# Patient Record
Sex: Male | Born: 1935 | Race: White | Hispanic: No | Marital: Married | Smoking: Never smoker
Health system: Southern US, Community
[De-identification: ages and names within clinical notes are randomized; demographics above are authoritative.]

## PROBLEM LIST (undated history)

## (undated) DIAGNOSIS — I714 Abdominal aortic aneurysm, without rupture, unspecified: Secondary | ICD-10-CM

## (undated) DIAGNOSIS — R519 Headache, unspecified: Secondary | ICD-10-CM

## (undated) DIAGNOSIS — IMO0001 Reserved for inherently not codable concepts without codable children: Secondary | ICD-10-CM

## (undated) DIAGNOSIS — I2119 ST elevation (STEMI) myocardial infarction involving other coronary artery of inferior wall: Secondary | ICD-10-CM

## (undated) DIAGNOSIS — I10 Essential (primary) hypertension: Secondary | ICD-10-CM

## (undated) DIAGNOSIS — K5900 Constipation, unspecified: Secondary | ICD-10-CM

## (undated) DIAGNOSIS — I350 Nonrheumatic aortic (valve) stenosis: Principal | ICD-10-CM

## (undated) DIAGNOSIS — J189 Pneumonia, unspecified organism: Secondary | ICD-10-CM

## (undated) DIAGNOSIS — R51 Headache: Secondary | ICD-10-CM

## (undated) DIAGNOSIS — F329 Major depressive disorder, single episode, unspecified: Secondary | ICD-10-CM

## (undated) DIAGNOSIS — E785 Hyperlipidemia, unspecified: Secondary | ICD-10-CM

## (undated) DIAGNOSIS — E119 Type 2 diabetes mellitus without complications: Secondary | ICD-10-CM

## (undated) DIAGNOSIS — F32A Depression, unspecified: Secondary | ICD-10-CM

## (undated) DIAGNOSIS — N200 Calculus of kidney: Secondary | ICD-10-CM

## (undated) DIAGNOSIS — I2510 Atherosclerotic heart disease of native coronary artery without angina pectoris: Secondary | ICD-10-CM

## (undated) DIAGNOSIS — M1990 Unspecified osteoarthritis, unspecified site: Secondary | ICD-10-CM

## (undated) DIAGNOSIS — K219 Gastro-esophageal reflux disease without esophagitis: Secondary | ICD-10-CM

## (undated) DIAGNOSIS — I209 Angina pectoris, unspecified: Secondary | ICD-10-CM

## (undated) DIAGNOSIS — Z8719 Personal history of other diseases of the digestive system: Secondary | ICD-10-CM

## (undated) HISTORY — PX: LITHOTRIPSY: SUR834

## (undated) HISTORY — PX: CARDIAC VALVE REPLACEMENT: SHX585

## (undated) HISTORY — DX: Abdominal aortic aneurysm, without rupture, unspecified: I71.40

## (undated) HISTORY — PX: COLONOSCOPY: SHX174

## (undated) HISTORY — DX: Nonrheumatic aortic (valve) stenosis: I35.0

## (undated) HISTORY — PX: CARDIAC CATHETERIZATION: SHX172

## (undated) HISTORY — DX: Abdominal aortic aneurysm, without rupture: I71.4

## (undated) HISTORY — DX: Hyperlipidemia, unspecified: E78.5

## (undated) HISTORY — DX: Unspecified osteoarthritis, unspecified site: M19.90

---

## 2001-01-25 DIAGNOSIS — I2119 ST elevation (STEMI) myocardial infarction involving other coronary artery of inferior wall: Secondary | ICD-10-CM

## 2001-01-25 HISTORY — DX: ST elevation (STEMI) myocardial infarction involving other coronary artery of inferior wall: I21.19

## 2001-03-30 ENCOUNTER — Ambulatory Visit (HOSPITAL_COMMUNITY): Admission: RE | Admit: 2001-03-30 | Discharge: 2001-03-30 | Payer: Self-pay | Admitting: Cardiology

## 2002-05-24 ENCOUNTER — Emergency Department (HOSPITAL_COMMUNITY): Admission: EM | Admit: 2002-05-24 | Discharge: 2002-05-24 | Payer: Self-pay | Admitting: Emergency Medicine

## 2002-05-24 ENCOUNTER — Encounter: Payer: Self-pay | Admitting: Emergency Medicine

## 2002-07-03 ENCOUNTER — Encounter: Payer: Self-pay | Admitting: Neurosurgery

## 2002-07-03 ENCOUNTER — Encounter: Admission: RE | Admit: 2002-07-03 | Discharge: 2002-07-03 | Payer: Self-pay | Admitting: Neurosurgery

## 2003-09-06 ENCOUNTER — Emergency Department (HOSPITAL_COMMUNITY): Admission: EM | Admit: 2003-09-06 | Discharge: 2003-09-07 | Payer: Self-pay | Admitting: Emergency Medicine

## 2005-10-23 ENCOUNTER — Emergency Department (HOSPITAL_COMMUNITY): Admission: EM | Admit: 2005-10-23 | Discharge: 2005-10-23 | Payer: Self-pay | Admitting: Emergency Medicine

## 2006-06-08 ENCOUNTER — Emergency Department (HOSPITAL_COMMUNITY): Admission: EM | Admit: 2006-06-08 | Discharge: 2006-06-08 | Payer: Self-pay | Admitting: Emergency Medicine

## 2007-01-26 HISTORY — PX: CORONARY ANGIOPLASTY WITH STENT PLACEMENT: SHX49

## 2007-08-30 ENCOUNTER — Inpatient Hospital Stay (HOSPITAL_COMMUNITY): Admission: EM | Admit: 2007-08-30 | Discharge: 2007-09-04 | Payer: Self-pay | Admitting: Emergency Medicine

## 2007-09-01 ENCOUNTER — Ambulatory Visit: Payer: Self-pay | Admitting: Cardiothoracic Surgery

## 2007-09-19 ENCOUNTER — Ambulatory Visit (HOSPITAL_COMMUNITY): Admission: RE | Admit: 2007-09-19 | Discharge: 2007-09-20 | Payer: Self-pay | Admitting: Cardiology

## 2007-10-12 ENCOUNTER — Encounter (HOSPITAL_COMMUNITY): Admission: RE | Admit: 2007-10-12 | Discharge: 2008-01-10 | Payer: Self-pay | Admitting: Cardiology

## 2008-09-29 ENCOUNTER — Emergency Department (HOSPITAL_COMMUNITY): Admission: EM | Admit: 2008-09-29 | Discharge: 2008-09-29 | Payer: Self-pay | Admitting: Emergency Medicine

## 2009-12-22 ENCOUNTER — Emergency Department (HOSPITAL_COMMUNITY): Admission: EM | Admit: 2009-12-22 | Discharge: 2009-12-22 | Payer: Self-pay | Admitting: Emergency Medicine

## 2010-01-12 ENCOUNTER — Ambulatory Visit (HOSPITAL_COMMUNITY)
Admission: RE | Admit: 2010-01-12 | Discharge: 2010-01-12 | Payer: Self-pay | Source: Home / Self Care | Attending: Urology | Admitting: Urology

## 2010-04-06 LAB — GLUCOSE, CAPILLARY: Glucose-Capillary: 149 mg/dL — ABNORMAL HIGH (ref 70–99)

## 2010-04-07 LAB — COMPREHENSIVE METABOLIC PANEL
ALT: 20 U/L (ref 0–53)
Alkaline Phosphatase: 83 U/L (ref 39–117)
Glucose, Bld: 200 mg/dL — ABNORMAL HIGH (ref 70–99)
Potassium: 4.2 mEq/L (ref 3.5–5.1)
Sodium: 139 mEq/L (ref 135–145)
Total Protein: 6.9 g/dL (ref 6.0–8.3)

## 2010-04-07 LAB — POCT CARDIAC MARKERS

## 2010-04-07 LAB — URINALYSIS, ROUTINE W REFLEX MICROSCOPIC
Glucose, UA: 100 mg/dL — AB
Ketones, ur: 15 mg/dL — AB
Specific Gravity, Urine: 1.02 (ref 1.005–1.030)
pH: 7.5 (ref 5.0–8.0)

## 2010-04-07 LAB — CBC
HCT: 44.3 % (ref 39.0–52.0)
Hemoglobin: 15.5 g/dL (ref 13.0–17.0)
RDW: 13.5 % (ref 11.5–15.5)
WBC: 11.3 10*3/uL — ABNORMAL HIGH (ref 4.0–10.5)

## 2010-06-09 NOTE — Op Note (Signed)
NAME:  Michael Wolfe, Michael Wolfe NO.:  [id number]   MEDICAL RECORD NO.:  [id number]          PATIENT TYPE:  INP   LOCATION:  2914                         FACILITY:  [facility]   PHYSICIAN:  Eduardo Osier. Sharyn Lull, M.D. DATE OF BIRTH:  08/08/1931   DATE OF PROCEDURE:  08/31/2007  DATE OF DISCHARGE:                               OPERATIVE REPORT   PROCEDURE:  1. Successful percutaneous transluminal coronary angioplasty to mid      left anterior descending using 2.5 x 12 mm long Voyager balloon.  2. Successful deployment of 2.5 x 23 mm long Promus drug-eluting stent      in mid left anterior descending.  3. Successful post dilatation of Promus drug-eluting stent using 2.75      x 15 mm long [place] Voyager balloon.  4. Successful percutaneous transluminal coronary angioplasty to ostial      diagonal 1 using 2.5 x 8 mm long Voyager balloon.  5. Successful deployment of 2.5 x 12 mm long Promus drug-eluting stent      in ostial diagonal 1 using modified crush technique.  6. Successful percutaneous transluminal coronary angioplasty to mid      left anterior descending using 2.5 x 20 and then 3.2 x 12 mm long      Voyager balloon.  7. Successful deployment of 3.0 x 33 mm long Cypher drug-eluting stent      in proximal left anterior descending.  8. Successful post dilatation of Cypher drug-eluting stent using 3.25      x 20 mm long [place] Voyager balloon.  9. Successful percutaneous transluminal coronary angioplasty to ostial      diagonal 1 and proximal left anterior descending using kissing      balloon technique.   INDICATION FOR THE PROCEDURE:  Michael Wolfe is a 77 year old white male  with past medical history significant for coronary artery disease,  history of MI in the past, hypertension, hypercholesterolemia, and  noncompliant to medication, and followup.  He was admitted yesterday  because of recurrent retrosternal chest pressure off and on with minimal  exertion for last 1 year lately.   His chest pain frequency and duration  has increased with minimal activity.  He describes chest pain rates 6/10  associated with shortness of breath.  EKG done initially in the ER  showed normal sinus rhythm with Q-wave in inferior leads and ST  depression in anterolateral leads and was noted to have elevated  troponin-I and CPK-MB.  The patient emergently underwent left cath  yesterday and was noted to have three-vessel CAD with occluded left  circumflex system.  The patient was referred for CVTS for possible CABG,  but the patient refused for surgery despite multiple discussions with  the patient and family.  The patient initially wanted to go home and  signed out against medical advice but finally agreed for percutaneous  intervention.   PROCEDURE:  After obtaining informed consent, the patient was brought to  the Cath Lab and was placed on fluoroscopic table.  Right one was  prepped and draped in usual fashion.  A 2% Xylocaine was used  for local  anesthesia in the right one.  With the help of thin wall needle, a 7-  French arterial sheath was placed.  The sheath was aspirated and  flushed.   A7-French LAD XB guiding catheter was advanced over the wire and a  fluoroscopic guidance up to the ascending aorta.  Wire was pulled out.  The catheter was aspirated and connected to the manifold.  The catheter  was further advanced and engaged in to left coronary ostium.  Multiple  views of the left system were taken.   FINDINGS:  As before, LAD has complex proximal sequential 85-90%  stenosis and 75-80% mid focal stenosis.  Diagonal 1 has ostial  bifurcation 85% stenosis with LAD.   INTERVENTIONAL PROCEDURE:  Successful PTCA to mid LAD was done using 2.5  x 12 mm long Voyager balloon for predilatation and then 2.5 x 23 mm long  Promus drug-eluting stent was deployed at 11 atmospheric pressure in mid  LAD.  Stent was postdilated using 2.75 x 15 mm long [place] Voyager balloon  going up to 18  atmospheric pressure.  Lesion was dilated from 75%-0%  residual with excellent TIMI grade 3 distal flow without evidence of  dissection or distal embolization.  Then, successful PTCA to ostial  diagonal 1 was done using 2.5 x 8 mm long Voyager balloon.  Two  inflations were done going up to 11 atmospheric pressure.  Angiogram  showed persistent elastic recoil and then 2.5 x 12 mm long Promus drug-  eluting stent was deployed in the ostial diagonal 1 using double wire  technique and modified crush technique keeping 2.5 x 20 mm long Voyager  balloon in LAD.  Diagonal 1 stent was deployed at 12 atmospheric  pressure.  Lesion was dilated from 85% to less than 10% ostial with  excellent TIMI grade 3 distal flow without evidence of dissection or  distal embolization.  Then PTCA to proximal LAD was done using 2.5 x 20  mm long Voyager and then 3.0 x 12 mm long Voyager balloon going up to 8  atmospheric pressure for predilatation and then 3.0 x 30 mm long Cypher  drug-eluting stent was deployed at 13 atmospheric pressures.  Stent was  postdilated using 3.25 x 20 mm long [place] Voyager balloon going up to 18  atmospheric pressure and finally PTCA to ostial diagonal 1 and using 2.5  x 12 mm long Voyager balloon and 3.0 x 12 mm long Voyager balloon in LAD  was done using kissing balloon technique going up to 8 atmospheric  pressure.  LAD lesions are dilated from 85% to 90% to 0% residual with  excellent TIMI grade 3 distal flow without evidence of dissection or  distal embolization.  The patient received weight-based heparin,  Integrilin, and 600 mg of Plavix during the procedure.  The patient  tolerated the procedure well.  There were no complications.  The patient  was transferred to recovery room in stable condition.      Eduardo Osier. Sharyn Lull, M.D.  Electronically Signed     [facility]/MEDQ  D:  08/31/2007  T:  09/01/2007  Job:  [id number]

## 2010-06-09 NOTE — Discharge Summary (Signed)
NAME:  Michael Wolfe, Michael Wolfe               ACCOUNT NO.:  [id number]   MEDICAL RECORD NO.:  [id number]          PATIENT TYPE:  INP   LOCATION:  6527                         FACILITY:  [facility]   PHYSICIAN:  Mohan N. Sharyn Lull, M.D. DATE OF BIRTH:  1931-03-29   DATE OF ADMISSION:  09/19/2007  DATE OF DISCHARGE:  09/20/2007                               DISCHARGE SUMMARY   ADMITTING DIAGNOSES:  1. Coronary artery disease, status post recent non-Q-wave myocardial      infarction and inferoposterior wall myocardial infarction.  2. Multi-vessel coronary artery disease, status post recent      percutaneous coronary intervention to left anterior descending and      diagonal 1.  3. Stable angina.  4. Hypertension.  5. Non-insulin-dependent diabetes mellitus.  6. Hypercholesterolemia.   DISCHARGE DIAGNOSES:  1. Coronary artery disease, status post percutaneous transluminal      coronary angioplasty and stenting to 100% occluded left circumflex.  2. Multi-vessel coronary artery disease.  3. History of recent non-Q-wave and inferoposterior wall myocardial      infarction, status post percutaneous transluminal coronary      angioplasty and stenting to left anterior descending and diagonal 1      in recent past.  4. Stable angina.  5. Hypertension.  6. Non-insulin-dependent diabetes mellitus.  7. Hypercholesterolemia.   DISCHARGE HOME MEDICATIONS:  1. Enteric-coated aspirin 325 mg 1 tablet daily.  2. Plavix 75 mg 1 tablet daily with food.  3. Toprol-XL 25 mg 1 tablet daily.  4. Lisinopril 10 mg 1 tablet daily.  5. Crestor 20 mg 1 tablet daily.  6. Actos 45 mg 1 tablet daily.  7. Nitrostat 0.4 mg sublingual use as directed.   DIET:  Low-salt, low-cholesterol, 1800-calorie ADA diet.   The patient has been advised to increase activity slowly.  Post-PTCA-  stent instructions have been given.  The patient has been advised to  monitor blood sugar daily.   Follow up with me in 1 week.   Condition on  discharge is stable.   BRIEF HISTORY AND HOSPITAL COURSE:  Michael Wolfe is a 77 year old white  man with past medical history significant for multi-vessel coronary  artery disease, status post recent non-Q-wave MI and status post PCI to  LAD and diagonal 1, history of  silent inferoposterior wall MI with  occluded left circumflex, hypertension, non-insulin-dependent diabetes  mellitus, hypercholesterolemia, recently discharged from the hospital,  was seen in the office for followup.  The patient denies any chest pain,  nausea, vomiting, or diaphoresis; denies any shortness of breath, states  his activity is very limited, and is ready for further intervention.  Denies any PND, orthopnea, or leg swelling.  Denies palpitations,  lightheadedness, or syncope.  States he feels stronger after PCI to LAD  and diagonal 1.  The patient is admitted electively for PCI to 100%  occluded left circumflex and obtuse marginal.   PAST MEDICAL HISTORY:  As above.   PAST SURGICAL HISTORY:  None.   ALLERGIES:  No known drug allergies.   MEDICATIONS AT HOME:  1. Enteric-coated aspirin 325 mg p.o. daily.  2. Plavix 75 mg p.o. daily.  3. Toprol-XL 25 mg p.o. daily.  4. Imdur 30 mg every morning.  5. Lisinopril 10 mg p.o. daily.  6. Crestor 20 mg p.o. daily.  7. Actos 45 mg p.o. daily.  8. Nitrostat sublingual p.r.n.   ALLERGIES:  No known drug allergies.   SOCIAL HISTORY:  He is married for 50-plus years, worked in Designer, fashion/clothing.  No history of smoking or alcohol abuse.  Born in [place], lives in  [place]   FAMILY HISTORY:  Father died of complications of congestive heart  failure and was hypertensive and diabetic.  Mother died of cancer.  One  brother and sister are in good health.   PHYSICAL EXAMINATION:  GENERAL:  He is alert, awake, and oriented x3 in  no acute distress.  VITAL SIGNS:  Blood pressure was 134/80, pulse was 70 and regular.  HEENT:  Conjunctivae pink.  NECK:   Supple.  No JVD.  No bruit.  LUNGS:  Clear to auscultation without rhonchi or rales.  CARDIOVASCULAR:  S1 and S2 were normal.  There was soft systolic murmur.  ABDOMEN:  Soft.  Bowel sounds were present, nontender.  EXTREMITIES:  There was no clubbing, cyanosis, or edema.   LABORATORIES POST PROCEDURE:  CPK total was 160, MB 2.9, hemoglobin is  13.3, hematocrit 39.3, white count of 7.9, BUN is 13, creatinine 1.14,  potassium is 4.4, and glucose is 135.   BRIEF HISTORY AND HOSPITAL COURSE:  The patient was a.m. admit and  underwent left cardiac catheterization and PTCA and stenting to 100%  occluded left circumflex as per procedure report.  The patient tolerated  the procedure well.  There were no complications.  Post procedure, the  patient did not have any episodes of chest pain.  His groin is stable  with no evidence of hematoma or bruit.  The patient had been ambulating  in hallway  without any problems.  His groin is stable with no evidence of hematoma  or bruit.  His blood pressures and blood sugars are well controlled.  The patient will be discharged home on above medications and will be  followed up in my office in 1 week.  The patient also will be scheduled  for phase II cardiac rehabilitation as outpatient.      Michael Wolfe. Sharyn Lull, M.D.  Electronically Signed     [facility]/MEDQ  D:  09/20/2007  T:  09/21/2007  Job:  [id number]

## 2010-06-09 NOTE — Cardiovascular Report (Signed)
NAME:  Michael Wolfe, Michael Wolfe NO.:  [id number]   MEDICAL RECORD NO.:  [id number]          PATIENT TYPE:  INP   LOCATION:  2914                         FACILITY:  [facility]   PHYSICIAN:  Eduardo Osier. Sharyn Lull, M.D. DATE OF BIRTH:  10-10-1931   DATE OF PROCEDURE:  08/30/2007  DATE OF DISCHARGE:                            CARDIAC CATHETERIZATION   PROCEDURE:  Left cardiac cath, selective left and right coronary  angiography, LV graft to the right groin using Judkins technique.   INDICATIONS FOR PROCEDURE:  Michael Wolfe is a 77 year old white male with  past medical history significant for coronary artery disease, history of  MI in the past in 2003, hypertension, hypercholesterolemia, noncompliant  to medication and followup.  He came to the ER complaining of recurrent  retrosternal chest pressure off and on with minimal exertion for last 1  year and did not seek any medical attention, but has lately this chest  pain occurred more frequently with minimal activity and today he had  severe chest pain grade 6/10 associated with shortness of breath while  mowing the yard.  So decided to come to the ER.  The patient's EKG  showed in the ER normal sinus rhythm with Q wave in the inferior leads  and ST depression in the anterolateral leads and was noted to have  elevated troponin I and CPK-MB.  The patient was last seen in the office  in 2003 after the cath and was noted at that time to have 50-60%  proximal LAD and 60-70% ostial diagonal 1 and was opted for medical  management.  Due to typical anginal chest pain, EKG changes, and  elevated cardiac markers, I discussed with the patient and his wife  regarding emergency left cath, possible PTCA stenting, its risks and  benefits, i.e. death, MI, or stroke, need for emergency CABG, risk of  restenosis, local vascular complications, etc., and consented for the  procedure.   PROCEDURE:  After obtaining the informed consent, the patient was  brought to the cath lab and was placed on fluoroscope table.  The right  groin was prepped and draped in usual fashion.  2% Xylocaine was used  for local anesthesia in the right groin.  With the help of thin-wall  needle, 6-French arterial sheath was placed.  The sheath was aspirated  and flushed.  Next, 6-French left Judkins catheter was advanced over the  wire under fluoroscopic guidance up to the ascending aorta.  Wire was  pulled out.  The catheter was aspirated and connected to the manifold.  The catheter was further advanced and engaged into left coronary ostium.  Multiple views of the left system were taken.  Next, the catheter was  disengaged and was pulled out over the wire and was replaced with 6-  [place] right Judkins catheter, which was advanced over the wire under  fluoroscopic guidance up to the ascending aorta.  The wire was pulled  out.  The catheter was aspirated and connected to the manifold.  The  catheter was further advanced and engaged into right coronary ostium.  Multiple views of the right system  were taken.  Next, catheter was  disengaged and was pulled out over the wire and was replaced with 6-  French pigtail catheter, which was advanced over the wire under  fluoroscopic guidance up to the ascending aorta.  The wire was pulled  out.  The catheter was aspirated and connected to the manifold.  The  catheter was further advanced across the aortic valve and into the LV.  LV pressures were recorded.  Next, LV graft was done in 30-degree RAO  position.  Postangiographic pressures were recorded from LV and then  pullback pressures were recorded from the aorta.  There was no  significant gradient across the aortic valve.  Next, the pigtail  catheter was pulled out over the wire.  Sheaths were aspirated and  flushed.   FINDINGS:  LV showed good LV systolic function, EF of 55% to 60%.  Left  main was patent.  LAD has complex proximal sequential 85-90% stenosis  with  haziness and has 75-80% mid focal stenosis with TIMI grade 3 distal  flow.  Diagonal I has ostial bifurcation stenosis with LAD in the range  of 85% and the vessel is moderate size.  Diagonal II has 90-95% proximal  stenosis.  Vessel is less than 1.5 mm.  Ramus is very small, has 70% mid  stenosis.  The vessel is diffusely diseased and not suitable for  revascularization.  Left circumflex is 100% occluded proximally.  OM1 is  very very small.  RCA has 20-25% proximal and distal stenosis, and 75%  distal stenosis just prior to the bifurcation with PDA.  RCA also is  supplying collateral to the obtuse marginal branch of circumflex.  The patient tolerated the procedure well.  There are no complications.  Due to multiple complex lesion, we will get CVTS consult for possible  CABG for complete revascularization as soon as possible.  The patient  presently is chest pain free and was transferred to recovery room in  stable condition.      Eduardo Osier. Sharyn Lull, M.D.  Electronically Signed     [facility]/MEDQ  D:  08/30/2007  T:  08/31/2007  Job:  [id number]   cc:   Cath lab

## 2010-06-09 NOTE — Discharge Summary (Signed)
NAME:  Michael Wolfe, LYFORD NO.:  [id number]   MEDICAL RECORD NO.:  [id number]          PATIENT TYPE:  INP   LOCATION:  2019                         FACILITY:  [facility]   PHYSICIAN:  Eduardo Osier. Sharyn Lull, M.D. DATE OF BIRTH:  Dec 17, 1931   DATE OF ADMISSION:  08/30/2007  DATE OF DISCHARGE:  09/04/2007                               DISCHARGE SUMMARY   ADMITTING DIAGNOSES:  1. Acute non-Q-wave myocardial infarction.  2. Probable posterior wall myocardial infarction, age undetermined.  3. Post-infarction angina.  4. Coronary artery disease.  5. History of myocardial infarction in the past.  6. Hypertension.  7. Hypercholesteremia.  8. New-onset diabetes mellitus.   FINAL DIAGNOSES:  1. Status post non-Q-wave myocardial infarction.  2. Status post post-infarction angina.  3. Multivessel coronary artery disease.  4. History of posterior wall myocardial infarction in the past.  5. Hypertension.  6. Hypercholesteremia.  7. New-onset diabetes mellitus.   DISCHARGE HOME MEDICATIONS:  1. Enteric-coated aspirin 325 mg 1 tablet daily.  2. Plavix 75 mg 1 tablet daily with food.  3. Toprol-XL 25 mg 1 tablet daily.  4. Imdur 30 mg 1 tablet every morning.  5. Lisinopril 10 mg 1 tablet daily.  6. Crestor 20 mg 1 tablet daily.  7. Actos 30 mg 1 tablet daily.  8. Nitrostat 0.4 mg sublingual, use as directed.   DIET:  Low-salt, low-cholesterol 1800 calories ADA diet.   The patient has been advised to monitor blood sugar twice daily.  Post  PTCA stent instructions have been given.  Increase activity slowly.  Avoid any lifting, pushing or pulling for 1 week.  Follow up with me in  1 week.   CONDITION AT DISCHARGE:  Stable.   The patient will be scheduled for phase 2 cardiac rehab after PCI to  left circumflex and distal RCA as stage procedure.   BRIEF HISTORY AND HOSPITAL COURSE:  Mr. Michael Wolfe is a 77 year old white  male with past medical history significant for coronary artery  disease,  history of MI in the past, hypertension, noncompliant to medication, and  followup.  He came to the ER complaining of recurrent retrosternal chest  pressure off and on with minimal exertion for last 1 year.  Lately,  chest pain occurs with minimal activity and today had severe chest pain  graded 6/10 associated with shortness of breath while moving in yard, so  decided to come to the ER.  The patient's EKG showed normal sinus rhythm  with ST depression in anterolateral leads and was noted to have elevated  troponin I and CPK-MB.  The patient was last seen in the office in 2003,  when he had left cath and was noted to have 50-60% proximal LAD and 60-  70% ostial diagonal one stenosis and opted for medical management.   PAST MEDICAL HISTORY:  As above.   PAST SURGICAL HISTORY:  None.   ALLERGIES:  No known drug allergies.   MEDICATIONS:  He takes aspirin on p.r.n. basis and Tylenol as needed.   SOCIAL HISTORY:  He is married for 50 plus years.  Worked in Designer, fashion/clothing.  No history of smoking or alcohol abuse.  He was born Michael Wolfe and  l[place] in [place], [place]   FAMILY HISTORY:  Father died of complications of congestive heart  failure.  He was hypertensive and diabetic at the age of 77.  Mother  died of cancer.  One brother and one sister in good health.   PHYSICAL EXAMINATION:  GENERAL:  He is alert, awake, and oriented x3, in  no acute distress.  VITAL SIGNS:  Blood pressure was 147/94 and pulse was 76 and regular.  HEENT:  Conjunctivae was pink.  NECK:  Supple.  No JVD.  No bruit.  LUNGS:  Clear to auscultation without rhonchi or rales.  CARDIOVASCULAR:  S1 and S2 normal.  There was soft systolic murmur.  ABDOMEN:  Soft.  Bowel sounds are present.  Nontender.  EXTREMITIES:  There is no clubbing, cyanosis, or edema.   LABORATORY DATA:  His other labs are not present in the chart, but  admission hemoglobin was 15.5, hematocrit 45.2, and white count of 8.2.   His CPK-MB was 7.5.  Troponin I was 0.15.  Repeat troponin I was 0.82  and CPK-MB was 19.2.  His glucose was 409, BUN 16, and creatinine 1.2.  His cholesterol was 266 and triglycerides 653.  Repeat CPK by lab 372,  MB 25.6, relative index 6.9, and troponin I was 5.5.  His hemoglobin A1c  was 12.7.  Troponin I on September 02, 2007, was 1.19.   BRIEF HOSPITAL COURSE:  The patient was directly taken to the cath lab  and underwent emergency left cath as per procedure report.  The patient  tolerated the procedure well.  The patient was noted to have multivessel  CAD, and CVTS consult initially was obtained for possible CABG, but the  patient refused for CABG and then subsequently underwent PTCA stenting  to LAD and diagonal one as per procedure report.  The patient tolerated  the procedure well.  There were no complications.  The patient did not  have any further episodes of chest pain during the hospital stay.  Phase  1cardiac rehab was called.  The patient has been ambulating in hallway  without any problems.  His groin is stable with no evidence of hematoma  or bruit.  The patient's blood sugar still remains in 200 range.  Actos  has been increased.  The patient will be discharged home and will be  followed up in my office in 1 week.  If the patient agrees for further  PCI or if the patient gets recurrent chest pain, we will consider PCI to  obtuse marginal and distal RCA.      Eduardo Osier. Sharyn Lull, M.D.  Electronically Signed     [facility]/MEDQ  D:  09/04/2007  T:  09/05/2007  Job:  [id number]

## 2010-06-09 NOTE — Cardiovascular Report (Signed)
NAME:  Michael Wolfe, Michael Wolfe               ACCOUNT NO.:  [id number]   MEDICAL RECORD NO.:  [id number]          PATIENT TYPE:  INP   LOCATION:  6527                         FACILITY:  [facility]   PHYSICIAN:  Mohan N. Sharyn Lull, M.D. DATE OF BIRTH:  [birth year]   DATE OF PROCEDURE:  09/19/2007  DATE OF DISCHARGE:                            CARDIAC CATHETERIZATION   PROCEDURE:  1. Left cardiac catheterization with selective left and right coronary      angiography via right groin using Judkins technique.  2. Successful percutaneous transluminal coronary angioplasty to 100%      occluded proximal left circumflex using initially 1.5 x 12-mm long      Sprinter balloon and then 2.5 x 12-mm long Voyager balloon.  3. Successful deployment of 3.0 x 28-mm long Promus drug-eluting stent      in proximal and mid left circumflex.  4. Successful post dilatation of Promus stent using 3.5 x 12-mm long      Rollins Voyager balloon.   INDICATIONS FOR PROCEDURE:  Mr. Bartoli is a 77 year old white male with  past medical history significant for multivessel coronary artery disease  status post recent non-Q-wave MI, status post PCI to LAD and diagonal 1,  history of silent inferoposterior wall MI in the past with occluded left  circumflex, hypertension, non-insulin-dependent diabetes mellitus,  hypercholesteremia, and recently discharged from the hospital was seen  in the office for followup.  The patient denies any chest pain, nausea,  vomiting, or diaphoresis.  Denies shortness of breath.  States his  activity is very limited as he is ready for further intervention.  Denies any PND, orthopnea, or leg swelling.  Denies palpitation,  lightheadedness, or syncope.  States feels stronger after PCI to LAD and  diagonal 1.  The patient is admitted electively for PCI to 100% of  occluded left circumflex plus/minus distal bifurcation RCA stenosis.  After obtaining informed consent, the patient was brought to the cath  lab and was  placed on fluoroscopy table.  Right groin was prepped and  draped in usual fashion.  Xylocaine 2% was used for local anesthesia in  the right groin.  With the help of thin-wall needle, a 6-French arterial  sheath was placed.  The sheath was aspirated and flushed.  Next, a 6-  [place], 3.5 XB guiding catheter was advanced over the wire under  fluoroscopic guidance to the ascending aorta.  Wire was pulled out.  The  catheter was aspirated and connected to the manifold.  Catheter was  further advanced and engaged into left coronary ostium.  Multiple views  of the left system were taken.   FINDINGS:  At the end of the procedure, right JR-4 guiding catheter was  advanced over the wire under fluoroscopic guidance up to the ascending  aorta.  Wire was pulled out.  The catheter was aspirated and connected  to the manifold.  Catheter was further advanced and engaged into right  coronary ostium.  A single view of right coronary artery was obtained.  Next, the catheter was pulled out over the wire.  Sheaths aspirated and  flushed.  FINDINGS:  LV was not done.  Left main was patent.  LAD diagonal 1 was  patent at prior PTCA stented site.  Left circumflex was 100% occluded  proximally as before.  RCA has multiple sequential proximal and mid 30-  40% stenosis and 50-60% distal bifurcation stenosis.   INTERVENTIONAL PROCEDURE:  Successful PTCA to proximal left circumflex  was done using 1.5 x 12-mm long Sprinter balloon for predilatation and  then 2.5 x 12-mm long Voyager balloon for predilatation and then 3.0 x  28-mm long Promus drug-eluting stent was deployed in proximal and mid  left circumflex at 13 atmospheric pressure.  Stent was postdilated using  3.5 x 12-mm long Voyager balloon going up to 18 atmospheric pressure.  Lesion was dilated from 100% to 0% diastole with excellent TIMI grade  III.  Distal flow without evidence of  dissection or distal embolization.  The patient received  weight-based  Angiomax and 300 mg of Plavix during the procedure.  The patient  tolerated the procedure well.  There are no complications.  The patient  was transferred to recovery room in stable condition.      Eduardo Osier. Sharyn Lull, M.D.  Electronically Signed     MNH/MEDQ  D:  09/19/2007  T:  09/20/2007  Job:  [id number]   cc:   Catheterization Laboratory

## 2010-06-09 NOTE — Consult Note (Signed)
NAME:  Michael Wolfe, Michael Wolfe NO.:  [id number]   MEDICAL RECORD NO.:  [id number]          PATIENT TYPE:  INP   LOCATION:  2914                         FACILITY:  [facility]   PHYSICIAN:  Kerin Perna, M.D.  DATE OF BIRTH:  19-Jun-1931   DATE OF CONSULTATION:  DATE OF DISCHARGE:                                 CONSULTATION   PHYSICIAN REQUESTING THIS CONSULTATION:  Mohan N. Harwani, MD   REASON FOR CONSULTATION:  Unstable angina with severe three-vessel  coronary artery disease.   CHIEF COMPLAINT:  Chest pain.   HISTORY OF PRESENT ILLNESS:  I was asked to evaluate this 77 year old  white male ex-smoker for potential surgical coronary revascularization  for recently diagnosed severe three-vessel coronary artery disease.  The  patient has had progressive chest pain over the past several days  related to exercise.  Any lifting or exertional activity usually brings  on substernal pressing chest pain with some radiation to the shoulders.  He has been taking over-the-counter pain medications without  improvement.  He presented to the emergency department this afternoon  with nonspecific ST-segment depression, and cardiac enzymes were checked  and his troponin one was 0.8.  He was taken for urgent cardiac  catheterization by Dr. Sharyn Lull which demonstrated a heavily diseased  proximal LAD with a tight 90-95% stenosis as well as some distal  disease.  There is also disease at the bifurcation of a large first  diagonal.  He had a hyperdominant right stenosis at the distal  bifurcation as well as a chronically occluded OM II branch of the  circumflex.  His EF was 50%.  He was pain free following cath; however,  due to his anatomy and recent symptoms, urgent surgical coronary  revascularization was recommended to the patient both by Dr. Sharyn Lull and  myself.  The patient at this time is not willing to proceed with surgery  and wishes to pursue further medical therapy.   PAST MEDICAL  HISTORY:  1. Hypertension.  2. Noninsulin-dependent diabetes, recent diagnosis.  3. Hyperlipidemia.  4. Chronic low back pain.   HOME MEDICATIONS:  Aspirin 81 mg and over-the-counter pain medication.   ALLERGIES:  None.   SOCIAL HISTORY:  The patient is married and retired.  He worked in  Designer, fashion/clothing and does not currently smoke or use alcohol.   FAMILY HISTORY:  Positive for hypertension and heart failure.   REVIEW OF SYSTEMS:  Constitutional review is negative for fever or  weight loss.  No recent serious upper respiratory infections.  He denies  problem swallowing.  He has total dental extraction with upper and lower  dental plates.  He had a cath done in 2003 with moderate nonsurgical  disease.  He denies history of diabetes, although his blood sugar today  was 400.  He has one episode of bilateral ankle swelling which responded  to elevation and Epsom salts.  He denies blood per rectum, jaundice, or  history of gallstones.  He denies BPH, kidney stones, or hematuria.  He  denies DVT, claudication, TIA, or stroke.  He denies bleeding disorder  or prior  blood transfusion.  He does not admit to diabetes or thyroid  disease.   PHYSICAL EXAMINATION:  The patient is 5 feet 9 inches and weighs 190  pounds.  Blood pressure is 140/80 and pulse 80 in sinus.  General  appearance is that of a pleasant elderly male in the cath lab holding  area in no acute stress.  HEENT exam is normocephalic.  He is  edentulous.  Neck is without JVD, mass, or bruit.  Lymphatics reveal no  palpable supraclavicular or cervical adenopathy.  Breath sounds are  clear.  There is no thoracic deformity.  Cardiac exam is regular rhythm  without rubs, gallop, or murmur.  Abdominal exam is soft without  pulsatile mass.  He has a compression dressing in the right groin from  the cardiac cath.  The extremities reveal mild clubbing, but no cyanosis  or edema.  Peripheral pulses are strong in all extremities.  He is  right-  hand dominant.  He has no focal motor deficit.   LABORATORY DATA:  I reviewed the coronary arteriograms with Dr. Sharyn Lull  and the patient would benefit from bypass graft to the LAD diagonal, OM  II, and distal posterolateral branch of the right coronary.  His EF is  fairly well preserved.  The patient is not willing to proceed with  surgery.  At this time, we will follow up in the morning in  approximately 12 hours to review the situation.  All questions regarding  surgery and the recommendation to proceed with surgery was clearly made  to the patient.   Thank you for this consultation.       Kerin Perna, M.D.  Electronically Signed     PV/MEDQ  D:  08/30/2007  T:  08/31/2007  Job:  [id number]

## 2010-06-12 NOTE — Cardiovascular Report (Signed)
[place]  Patient:    Michael Wolfe, Michael Wolfe Visit Number: [id number] MRN: [id number]          Service Type: CAT Location: [facility] 2857 [id number] Attending Physician:  Robynn Pane Dictated by:   Eduardo Osier Sharyn Lull, M.D. Proc. Date: 03/30/01 Admit Date:  03/30/2001   CC:         Cardiac Catheterization Laboratory  Osvaldo Shipper. Spruill, M.D.   Cardiac Catheterization  PROCEDURE: Left cardiac catheterization with selective left and right coronary angiography, left ventriculography via the right groin using Judkins technique.  INDICATIONS FOR PROCEDURE: The patient is a 77 year old, white male with a past medical history significant for coronary artery disease, status post recent inferoposterior wall MI, hypertension, hypercholesterolemia, recently discharged from [facility] [place], complaining of feeling weak, tired and shortness of breath with minimal exertion. He states he was in [place] and was told to have MI and subsequently underwent stress Cardiolite which showed inferolateral wall ischemia with EF of 40% and was advised for left catheterization, possible PTCA and stenting, but came to [facility] for a second opinion. The patient denies chest pain, nausea, vomiting or diaphoresis. Denies PND, orthopnea, leg swelling. Denies palpitations, lightheadedness or syncope.  PAST MEDICAL HISTORY: As above.  PAST SURGICAL HISTORY: None.  ALLERGIES: None.  MEDICATIONS: He takes 1. Enteric-coated aspirin 325 mg p.o. q.d. 2. Coreg 3.125 mg p.o. q.12h. 3. Diovan HCT 160/12.5 p.o. q.d. 4. Lipitor 80 mg half tablet q.d.  SOCIAL HISTORY: He is married for 40 years, retired, worked in Press photographer. No history of smoking or alcohol abuse.  He was born in [place], moved from [place], [place], recently.  FAMILY HISTORY: Father died of congestive heart failure at the age of 51. He was hypertensive and  diabetic. Mother is alive. She is 89. She has cancer. One brother and one sister in good health.  PHYSICAL EXAMINATION:  GENERAL: On examination, he is alert, awake, oriented x3 in no acute distress.   VITAL SIGNS: Blood pressure was 140/84, pulse was 80, regular.  HEENT: Conjunctiva pink.  NECK: Supple, no JVD, no bruits.  LUNGS: Lungs are clear to auscultation without rhonchi or rales.  CARDIOVASCULAR: Cardiovascular examination: S1 and S2 were normal There was no S3 gallop. There was a soft systolic murmur at the apex.  ABDOMEN:  Soft. Bowel sounds were present, nontender.  EXTREMITIES: There is no clubbing, cyanosis or edema.  IMPRESSION: Coronary artery disease, status post inferoposterior wall myocardial infarction, positive stress Cardiolite, hypertension, hypercholesterolemia, exertional dyspnea, and weakness, probable angina equivalent.  PLAN: Discussed with patient regarding left catheterization, possible PTCA and stenting, it risks and benefits, i.e. death, MI, stroke, need for emergency CABG, risk of restenosis, local vascular complications, etc., and consented for PCI.  DESCRIPTION OF PROCEDURE: After obtaining the informed consent, the patient was brought to the catheterization lab and was placed on the fluoroscopy table.  The right groin was prepped and draped in the usual fashion. Xylocaine 2% was used for local anesthesia in the right groin. With the help of a thin-walled needle, a 6 French arterial sheath was placed. The sheath was aspirated and flushed. Next, a 6 French left Judkins catheter was advanced over the wire under fluoroscopic guidance up to the ascending aorta. The wire was pulled out, the catheter was aspirated and connected to the manifold. The catheter was further advanced and attempted to engage into the left coronary ostium without success. Then a 5  [place] JL4 catheter was advanced over the wire under fluoroscopic guidance up to the  ascending aorta. The wire was pulled out, the catheter was aspirated and connected to the manifold. The catheter was further advanced and engaged into left coronary ostium. Multiple views of the left system were taken. Next, the catheter was disengaged and was pulled out over the wire and was replaced with a 6 French right Judkins catheter, which was advanced over the wire under fluoroscopic guidance up to the ascending aorta. The wire was pulled out, the catheter was aspirated and connected to the manifold. The catheter was further advanced and engaged into the right coronary ostium. Multiple views of the right system were taken. Next, the catheter was disengaged and was pulled out over the wire and was replaced with a 6 French pigtail catheter, which was advanced over the wire under fluoroscopic guidance up to the ascending aorta. The wire was pulled out, the catheter was aspirated and connected to the manifold. The catheter was further advanced across the aortic valve into the LV. LV pressures were recorded. Next, LV-graphy was done in 30-degree RAO position. Post angiographic pressures were recorded from the LV and then pullback pressures were recorded from the aorta. There was no gradient across the aortic valve. Next, the pigtail catheter was pulled out over the wire, sheaths were aspirated and flushed.  FINDINGS: LV showed good left ventricular systolic function. LV had EF of 60-65%.  Left main was patent.  LAD has 50-60% proximal stenosis. Diagonal #1 has 60-70% ostial stenosis.  Left circumflex is patent proximally and tapers down in the AV groove after giving off large OM-3. OM-1 and OM-2 are very small. They are less than 0.5 mm. OM-3 is large which is patent.  RCA is patent.  Arteriotomy was closed with Perclose without any complications. The patient tolerated the procedure well. There were no complications.   PLAN: To continue with medical treatment. If patient has  recurrent chest pain, will consider doing stress Cardiolite here before attempting to do PCI to diagonal. Dictated by:   Eduardo Osier. Sharyn Lull, M.D. Attending Physician:  Robynn Pane DD:  03/30/01 TD:  03/30/01 Job: [id number]

## 2010-10-23 LAB — BASIC METABOLIC PANEL
BUN: 14
BUN: 14
CO2: 27
CO2: 28
Chloride: 102
Chloride: 103
Chloride: 104
Creatinine, Ser: 0.92
Creatinine, Ser: 1
Creatinine, Ser: 1.01
GFR calc Af Amer: >60
GFR calc non Af Amer: >60
GFR calc non Af Amer: >60
Glucose, Bld: 206 — ABNORMAL HIGH
Glucose, Bld: 209 — ABNORMAL HIGH
Glucose, Bld: 218 — ABNORMAL HIGH
Glucose, Bld: 232 — ABNORMAL HIGH
Potassium: 3.7
Potassium: 4.3
Sodium: 136

## 2010-10-23 LAB — CBC
HCT: 37.6 — ABNORMAL LOW
HCT: 38.2 — ABNORMAL LOW
HCT: 39.7
HCT: 44.6
HCT: 45.2
Hemoglobin: 13.1
Hemoglobin: 13.2
Hemoglobin: 13.6
Hemoglobin: 13.7
Hemoglobin: 15.5
MCHC: 34.4
MCHC: 34.6
MCHC: 34.8
MCHC: 35
MCV: 93.6
MCV: 93.8
MCV: 94.2
MCV: 94.3
MCV: 94.7
Platelets: 157
Platelets: 188
RBC: 4.04 — ABNORMAL LOW
RBC: 4.17 — ABNORMAL LOW
RBC: 4.84
RDW: 13.7
RDW: 13.8
RDW: 13.8
WBC: 11.1 — ABNORMAL HIGH
WBC: 8.2
WBC: 9.4

## 2010-10-23 LAB — DIFFERENTIAL
Basophils Absolute: 0
Eosinophils Absolute: 0.3
Eosinophils Relative: 2
Eosinophils Relative: 3
Lymphocytes Relative: 20
Lymphocytes Relative: 26
Lymphs Abs: 2.1
Monocytes Absolute: 0.7
Monocytes Absolute: 0.9

## 2010-10-23 LAB — APTT: aPTT: 31

## 2010-10-23 LAB — HEPARIN LEVEL (UNFRACTIONATED): Heparin Unfractionated: 0.26 — ABNORMAL LOW

## 2010-10-23 LAB — CROSSMATCH: ABO/RH(D): AB POS

## 2010-10-23 LAB — CK TOTAL AND CKMB (NOT AT ARMC)
CK, MB: 13.3 — ABNORMAL HIGH
CK, MB: 28.6 — ABNORMAL HIGH
Relative Index: 6.9 — ABNORMAL HIGH
Relative Index: 7.7 — ABNORMAL HIGH
Total CK: 219
Total CK: 357 — ABNORMAL HIGH
Total CK: 370 — ABNORMAL HIGH
Total CK: 372 — ABNORMAL HIGH

## 2010-10-23 LAB — LIPID PANEL
HDL: 36 — ABNORMAL LOW
Triglycerides: 653 — ABNORMAL HIGH
VLDL: UNDETERMINED

## 2010-10-23 LAB — PROTIME-INR
INR: 1
Prothrombin Time: 13.1

## 2010-10-23 LAB — POCT CARDIAC MARKERS
CKMB, poc: 19.2
CKMB, poc: 7.5
Troponin i, poc: 0.82

## 2010-10-23 LAB — POCT I-STAT, CHEM 8
BUN: 16
Calcium, Ion: 1.04 — ABNORMAL LOW
Chloride: 105
Creatinine, Ser: 1.2
TCO2: 23

## 2010-10-23 LAB — TROPONIN I
Troponin I: 1.19
Troponin I: 6.39

## 2010-10-26 LAB — GLUCOSE, CAPILLARY
Glucose-Capillary: 124 — ABNORMAL HIGH
Glucose-Capillary: 82
Glucose-Capillary: 158 — ABNORMAL HIGH
Glucose-Capillary: 174 — ABNORMAL HIGH
Glucose-Capillary: 93
Glucose-Capillary: 97

## 2010-12-31 ENCOUNTER — Other Ambulatory Visit: Payer: Self-pay | Admitting: Cardiology

## 2011-01-24 ENCOUNTER — Other Ambulatory Visit: Payer: Self-pay | Admitting: Cardiology

## 2011-02-04 ENCOUNTER — Other Ambulatory Visit: Payer: Self-pay | Admitting: Cardiology

## 2011-05-12 ENCOUNTER — Other Ambulatory Visit: Payer: Self-pay | Admitting: Cardiology

## 2011-08-11 ENCOUNTER — Other Ambulatory Visit: Payer: Self-pay | Admitting: Cardiology

## 2011-09-12 ENCOUNTER — Other Ambulatory Visit: Payer: Self-pay | Admitting: Cardiology

## 2012-08-25 ENCOUNTER — Other Ambulatory Visit (HOSPITAL_COMMUNITY): Payer: Self-pay | Admitting: Cardiology

## 2012-08-25 DIAGNOSIS — R079 Chest pain, unspecified: Secondary | ICD-10-CM

## 2012-09-01 ENCOUNTER — Encounter (HOSPITAL_COMMUNITY)
Admission: RE | Admit: 2012-09-01 | Discharge: 2012-09-01 | Disposition: A | Discharge status: Home / Self Care | Payer: Medicare Other | Source: Ambulatory Visit | Attending: Cardiology | Admitting: Cardiology

## 2012-09-01 ENCOUNTER — Other Ambulatory Visit: Payer: Self-pay

## 2012-09-01 DIAGNOSIS — R079 Chest pain, unspecified: Secondary | ICD-10-CM

## 2012-09-01 MED ORDER — TECHNETIUM TC 99M SESTAMIBI GENERIC - CARDIOLITE
10.0000 | Freq: Once | INTRAVENOUS | Status: AC | PRN
  Administered 2012-09-01: 10 via INTRAVENOUS

## 2012-09-01 MED ORDER — REGADENOSON 0.4 MG/5ML IV SOLN
0.4000 mg | Freq: Once | INTRAVENOUS | Status: AC
  Administered 2012-09-01: 0.4 mg via INTRAVENOUS

## 2012-09-01 MED ORDER — REGADENOSON 0.4 MG/5ML IV SOLN
INTRAVENOUS | Status: AC
  Filled 2012-09-01: qty 5

## 2012-09-01 MED ORDER — TECHNETIUM TC 99M SESTAMIBI GENERIC - CARDIOLITE
30.0000 | Freq: Once | INTRAVENOUS | Status: AC | PRN
  Administered 2012-09-01: 30 via INTRAVENOUS

## 2012-09-11 ENCOUNTER — Encounter (HOSPITAL_COMMUNITY): Payer: Self-pay | Admitting: Pharmacy Technician

## 2012-09-12 ENCOUNTER — Encounter (HOSPITAL_COMMUNITY)
Admission: RE | Disposition: A | Discharge status: Home / Self Care | Payer: Self-pay | Source: Ambulatory Visit | Attending: Cardiology

## 2012-09-12 ENCOUNTER — Ambulatory Visit (HOSPITAL_COMMUNITY)
Admission: RE | Admit: 2012-09-12 | Discharge: 2012-09-12 | Disposition: A | Discharge status: Home / Self Care | Payer: Medicare Other | Source: Ambulatory Visit | Attending: Cardiology | Admitting: Cardiology

## 2012-09-12 DIAGNOSIS — I359 Nonrheumatic aortic valve disorder, unspecified: Secondary | ICD-10-CM | POA: Insufficient documentation

## 2012-09-12 DIAGNOSIS — E7800 Pure hypercholesterolemia, unspecified: Secondary | ICD-10-CM | POA: Insufficient documentation

## 2012-09-12 DIAGNOSIS — E119 Type 2 diabetes mellitus without complications: Secondary | ICD-10-CM | POA: Insufficient documentation

## 2012-09-12 DIAGNOSIS — I2510 Atherosclerotic heart disease of native coronary artery without angina pectoris: Secondary | ICD-10-CM | POA: Insufficient documentation

## 2012-09-12 DIAGNOSIS — I252 Old myocardial infarction: Secondary | ICD-10-CM | POA: Insufficient documentation

## 2012-09-12 DIAGNOSIS — Z9861 Coronary angioplasty status: Secondary | ICD-10-CM | POA: Insufficient documentation

## 2012-09-12 DIAGNOSIS — N189 Chronic kidney disease, unspecified: Secondary | ICD-10-CM | POA: Insufficient documentation

## 2012-09-12 DIAGNOSIS — R9439 Abnormal result of other cardiovascular function study: Secondary | ICD-10-CM | POA: Insufficient documentation

## 2012-09-12 DIAGNOSIS — I129 Hypertensive chronic kidney disease with stage 1 through stage 4 chronic kidney disease, or unspecified chronic kidney disease: Secondary | ICD-10-CM | POA: Insufficient documentation

## 2012-09-12 HISTORY — PX: CORONARY ANGIOGRAM: SHX5466

## 2012-09-12 LAB — GLUCOSE, CAPILLARY: Glucose-Capillary: 152 mg/dL — ABNORMAL HIGH (ref 70–99)

## 2012-09-12 SURGERY — CORONARY ANGIOGRAM

## 2012-09-12 MED ORDER — SODIUM CHLORIDE 0.9 % IJ SOLN: 3.0000 mL | Freq: Two times a day (BID) | INTRAMUSCULAR | Status: DC

## 2012-09-12 MED ORDER — SODIUM BICARBONATE BOLUS VIA INFUSION
INTRAVENOUS | Status: AC
  Administered 2012-09-12: 07:00:00 via INTRAVENOUS

## 2012-09-12 MED ORDER — ASPIRIN 81 MG PO CHEW
CHEWABLE_TABLET | ORAL | Status: AC
  Filled 2012-09-12: qty 4

## 2012-09-12 MED ORDER — METFORMIN HCL 500 MG PO TABS: 1000.0000 mg | ORAL_TABLET | Freq: Two times a day (BID) | ORAL | Status: DC

## 2012-09-12 MED ORDER — SODIUM CHLORIDE 0.9 % IV SOLN
INTRAVENOUS | Status: DC
  Administered 2012-09-12: 06:00:00 via INTRAVENOUS

## 2012-09-12 MED ORDER — SODIUM CHLORIDE 0.9 % IV SOLN: INTRAVENOUS | Status: AC

## 2012-09-12 MED ORDER — SODIUM BICARBONATE 8.4 % IV SOLN
INTRAVENOUS | Status: DC
  Filled 2012-09-12: qty 1000

## 2012-09-12 MED ORDER — ACETAMINOPHEN 325 MG PO TABS: 650.0000 mg | ORAL_TABLET | ORAL | Status: DC | PRN

## 2012-09-12 MED ORDER — LIDOCAINE HCL (PF) 1 % IJ SOLN
INTRAMUSCULAR | Status: AC
  Filled 2012-09-12: qty 30

## 2012-09-12 MED ORDER — ONDANSETRON HCL 4 MG/2ML IJ SOLN: 4.0000 mg | Freq: Four times a day (QID) | INTRAMUSCULAR | Status: DC | PRN

## 2012-09-12 MED ORDER — MIDAZOLAM HCL 2 MG/2ML IJ SOLN
INTRAMUSCULAR | Status: AC
  Filled 2012-09-12: qty 2

## 2012-09-12 MED ORDER — DIAZEPAM 5 MG PO TABS
ORAL_TABLET | ORAL | Status: AC
  Filled 2012-09-12: qty 1

## 2012-09-12 MED ORDER — NITROGLYCERIN 0.2 MG/ML ON CALL CATH LAB
INTRAVENOUS | Status: AC
  Filled 2012-09-12: qty 1

## 2012-09-12 MED ORDER — ASPIRIN 81 MG PO CHEW
324.0000 mg | CHEWABLE_TABLET | ORAL | Status: AC
  Administered 2012-09-12: 324 mg via ORAL

## 2012-09-12 MED ORDER — DIAZEPAM 5 MG PO TABS
5.0000 mg | ORAL_TABLET | ORAL | Status: AC
  Administered 2012-09-12: 5 mg via ORAL

## 2012-09-12 MED ORDER — SODIUM CHLORIDE 0.9 % IJ SOLN: 3.0000 mL | INTRAMUSCULAR | Status: DC | PRN

## 2012-09-12 MED ORDER — SODIUM CHLORIDE 0.9 % IV SOLN: 250.0000 mL | INTRAVENOUS | Status: DC | PRN

## 2012-09-12 MED ORDER — HEPARIN (PORCINE) IN NACL 2-0.9 UNIT/ML-% IJ SOLN
INTRAMUSCULAR | Status: AC
  Filled 2012-09-12: qty 2000

## 2012-09-12 MED ORDER — FENTANYL CITRATE 0.05 MG/ML IJ SOLN
INTRAMUSCULAR | Status: AC
  Filled 2012-09-12: qty 2

## 2012-09-12 NOTE — CV Procedure (Signed)
Left cardiac cath report dictated on 09/12/2012 dictation number is [id number]

## 2012-09-12 NOTE — Interval H&P Note (Signed)
Cath Lab Visit (complete for each Cath Lab visit)  Clinical Evaluation Leading to the Procedure:   ACS: yes  Non-ACS:    Anginal Classification: CCS II  Anti-ischemic medical therapy: Maximal Therapy (2 or more classes of medications)  Non-Invasive Test Results: Low-risk stress test findings: cardiac mortality <1%/year  Prior CABG: No previous CABG      History and Physical Interval Note:  09/12/2012 10:02 AM  Michael Wolfe  has presented today for surgery, with the diagnosis of abnormal stress test  The various methods of treatment have been discussed with the patient and family. After consideration of risks, benefits and other options for treatment, the patient has consented to  Procedure(s): CORONARY ANGIOGRAM as a surgical intervention .  The patient's history has been reviewed, patient examined, no change in status, stable for surgery.  I have reviewed the patient's chart and labs.  Questions were answered to the patient's satisfaction.     Robynn Pane

## 2012-09-12 NOTE — H&P (Signed)
  Handwritten H&P in the chart needs to be scanned. 

## 2012-09-12 NOTE — Progress Notes (Signed)
PER DR HARWANI OK TO FINISH THIS BAG OF SODIUM BICARB AND THEN DISCONTINUE

## 2012-09-12 NOTE — Cardiovascular Report (Signed)
NAME:  Michael Wolfe, Michael Wolfe NO.:  [id number]  MEDICAL RECORD NO.:  [id number]  LOCATION:  [facility]                         FACILITY:  [facility]  PHYSICIAN:  Eduardo Osier. Sharyn Lull, M.D. DATE OF BIRTH:  1931/09/09  DATE OF PROCEDURE:  09/12/2012 DATE OF DISCHARGE:                           CARDIAC CATHETERIZATION   PROCEDURE:  Left cardiac cath with selective left and right coronary angiography via right groin using Judkins technique.  INDICATION FOR THE PROCEDURE:  Mr. Hattabaugh is a 79 year old white male with past medical history significant for coronary artery disease, history of non-Q-wave myocardial infarction in August 2009, noted to have multivessel coronary artery disease refused for CABG, underwent PCI to proximal and mid LAD using 2.5 x 23 mm long PROMUS element drug- eluting stent in mid LAD and 3.0 x 33 mm long Cypher drug-eluting stent in the proximal LAD and 2.5 x 12 mm long PROMUS element drug-eluting stent in proximal diagonal 1 and subsequently had staged PCI to chronically occluded left circumflex.  He had 3.0 x 28 mm long PROMUS drug-eluting stent in proximal and mid left circumflex, hypertension, non-insulin-dependent diabetes mellitus, hypercholesteremia, moderately severe aortic stenosis, refused CABG, chronic kidney disease, complains of retrosternal chest pain while lifting heavy water bottle, relieves with rest.  The patient states chest pain feels as if someone is mashing on his chest.  The patient also complains of exertional dyspnea with minimal exertion associated with feeling weak and tired.  The patient denies any PND, orthopnea, or leg swelling.  The patient denies any palpitation, lightheadedness, or syncopal episode.  The patient denies any relation of chest pain to food, breathing, or movement.  The patient underwent Lexiscan Myoview on September 01, 2012, which showed small area of mid inferior wall infarct with small area of peri-infarct ischemia  with EF of 54%.  Due to typical anginal chest pain, multiple risk factors and abnormal stress test.  Discussed with the patient regarding left cath, possible PTCA stenting, versus left cath and CABG, the patient absolutely refused for CABG, but agreed to proceed with PCI.  PROCEDURE:  After obtaining the informed consent, the patient was brought to the cath lab and was placed on fluoroscopy table.  Right groin was prepped and draped in the usual fashion.  Xylocaine 2% was used for local anesthesia in the right groin.  With the help of thin wall needle, a 5-French arterial sheath was placed.  The sheath was aspirated and flushed.  Next, 5-French left Judkins catheter was advanced over the wire under fluoroscopic guidance up to the ascending aorta.  Wire was pulled out.  The catheter was aspirated and connected to the Manifold.  Catheter was further advanced and engaged into left coronary ostium.  Multiple views of the left system were taken.  Next, the catheter was disengaged and was pulled out and was replaced with 5- [place] right Judkins catheter, which was advanced over the wire under fluoroscopic guidance up to the ascending aorta.  Wire was pulled out. The catheter was aspirated and connected to the Manifold.  The catheter was further advanced and engaged into right coronary ostium.  Multiple views of the right system were taken.  Next, the catheter  was disengaged and was pulled out over the wire.  Sheaths were aspirated and flushed.  FINDINGS:  Left main has 15-20% distal stenosis.  LAD has 10-15% ostial stenosis and 20-30% proximal stenosis.  Stented segment in mid LAD has minimal disease and in proximal portion 20-30% stenosis.  Diagonal 1 stented segment is patent.  Left circumflex has 10-15% proximal stenosis.  Stented segment is widely patent.  RCA has 30- 40% mid stenosis and 20-30% distal stenosis and 50-60% bifurcation stenosis with PDA which is very small vessel.  The  patient tolerated the procedure well.  There were no complications.  The plan is to maximize antianginal medications and treat medically.     Eduardo Osier. Sharyn Lull, M.D.     MNH/MEDQ  D:  09/12/2012  T:  09/12/2012  Job:  [id number]

## 2013-07-19 ENCOUNTER — Emergency Department (HOSPITAL_COMMUNITY)
Admission: EM | Admit: 2013-07-19 | Discharge: 2013-07-19 | Disposition: A | Discharge status: Home / Self Care | Payer: Medicare Other | Attending: Emergency Medicine | Admitting: Emergency Medicine

## 2013-07-19 ENCOUNTER — Encounter (HOSPITAL_COMMUNITY): Payer: Self-pay | Admitting: Emergency Medicine

## 2013-07-19 DIAGNOSIS — Z9889 Other specified postprocedural states: Secondary | ICD-10-CM | POA: Insufficient documentation

## 2013-07-19 DIAGNOSIS — Z87442 Personal history of urinary calculi: Secondary | ICD-10-CM | POA: Insufficient documentation

## 2013-07-19 DIAGNOSIS — E119 Type 2 diabetes mellitus without complications: Secondary | ICD-10-CM | POA: Insufficient documentation

## 2013-07-19 DIAGNOSIS — I2510 Atherosclerotic heart disease of native coronary artery without angina pectoris: Secondary | ICD-10-CM | POA: Insufficient documentation

## 2013-07-19 DIAGNOSIS — T169XXA Foreign body in ear, unspecified ear, initial encounter: Secondary | ICD-10-CM | POA: Insufficient documentation

## 2013-07-19 DIAGNOSIS — Z7982 Long term (current) use of aspirin: Secondary | ICD-10-CM | POA: Insufficient documentation

## 2013-07-19 DIAGNOSIS — T161XXA Foreign body in right ear, initial encounter: Secondary | ICD-10-CM

## 2013-07-19 DIAGNOSIS — I10 Essential (primary) hypertension: Secondary | ICD-10-CM | POA: Insufficient documentation

## 2013-07-19 DIAGNOSIS — Z79899 Other long term (current) drug therapy: Secondary | ICD-10-CM | POA: Insufficient documentation

## 2013-07-19 DIAGNOSIS — Y9389 Activity, other specified: Secondary | ICD-10-CM | POA: Insufficient documentation

## 2013-07-19 DIAGNOSIS — Z7902 Long term (current) use of antithrombotics/antiplatelets: Secondary | ICD-10-CM | POA: Insufficient documentation

## 2013-07-19 DIAGNOSIS — Y9289 Other specified places as the place of occurrence of the external cause: Secondary | ICD-10-CM | POA: Insufficient documentation

## 2013-07-19 DIAGNOSIS — IMO0002 Reserved for concepts with insufficient information to code with codable children: Secondary | ICD-10-CM | POA: Insufficient documentation

## 2013-07-19 HISTORY — DX: Atherosclerotic heart disease of native coronary artery without angina pectoris: I25.10

## 2013-07-19 HISTORY — DX: Essential (primary) hypertension: I10

## 2013-07-19 MED ORDER — OFLOXACIN 0.3 % OT SOLN: 5.0000 [drp] | Freq: Two times a day (BID) | OTIC | Status: DC

## 2013-07-19 NOTE — ED Provider Notes (Signed)
Medical screening examination/treatment/procedure(s) were performed by non-physician practitioner and as supervising physician I was immediately available for consultation/collaboration.   EKG Interpretation None        Jeanpaul Biehl, MD 07/19/13 [id number] 

## 2013-07-19 NOTE — ED Notes (Signed)
Pt refused WC ambulated w RN to front entrance

## 2013-07-19 NOTE — ED Provider Notes (Signed)
CSN: [id number]     Arrival date & time 07/19/13  1704 History   First MD Initiated Contact with Patient 07/19/13 1733     Chief Complaint  Patient presents with  . Bug in ear      (Consider location/radiation/quality/duration/timing/severity/associated sxs/prior Treatment) HPI Comments: Patient is a 80 year old male who presents with a bug in his right ear. Patient reports working outside when he felt something fly into his ear. He reports "mashing" on his ear to kill the bug. He was unable to get the bug out. He reports a mild aching pain to the right ear. No other associated symptoms. No aggravating/alleviating factors.    Past Medical History  Diagnosis Date  . Diabetes mellitus without complication   . Hypertension   . Coronary artery disease   . Renal disorder     kidney stones   Past Surgical History  Procedure Laterality Date  . Cardiac surgery      stent placement   History reviewed. No pertinent family history. History  Substance Use Topics  . Smoking status: Never Smoker   . Smokeless tobacco: Not on file  . Alcohol Use: No    Review of Systems  HENT: Positive for ear pain.   All other systems reviewed and are negative.     Allergies  Review of patient's allergies indicates no known allergies.  Home Medications   Prior to Admission medications   Medication Sig Start Date End Date Taking? Authorizing Sukhraj Esquivias  acetaminophen (TYLENOL) 500 MG tablet Take 500 mg by mouth every 6 (six) hours as needed for pain.    Historical Dudley Mages, MD  amLODipine (NORVASC) 5 MG tablet Take 5 mg by mouth 2 (two) times daily.    Historical Zafir Schauer, MD  aspirin 81 MG tablet Take 81 mg by mouth daily.    Historical Sherina Stammer, MD  clopidogrel (PLAVIX) 75 MG tablet Take 75 mg by mouth daily.    Historical Judiann Celia, MD  fish oil-omega-3 fatty acids 1000 MG capsule Take 3 g by mouth daily.     Historical Ajanae Virag, MD  glimepiride (AMARYL) 2 MG tablet Take 2 mg by mouth daily.      Historical Antwine Agosto, MD  lisinopril (PRINIVIL,ZESTRIL) 40 MG tablet Take 40 mg by mouth daily.    Historical Jeremian Whitby, MD  metFORMIN (GLUCOPHAGE) 500 MG tablet Take 500-1,000 mg by mouth 2 (two) times daily with a meal. Takes 2 tablets in am and 1 tablet in pm    Historical Miela Desjardin, MD  metoprolol succinate (TOPROL-XL) 50 MG 24 hr tablet Take 50 mg by mouth daily. Take with or immediately following a meal.    Historical Miyoshi Ligas, MD  OVER THE COUNTER MEDICATION Take 1-2 tablets by mouth at bedtime as needed. For sleep. Equate brand Nighttime Sleep Aid    Historical Kyliana Standen, MD  rosuvastatin (CRESTOR) 20 MG tablet Take 20 mg by mouth daily.    Historical Malissie Musgrave, MD   BP 127/69  Pulse 65  Temp(Src) 97.5 F (36.4 C) (Oral)  Resp 22  Ht 5\' 8"  (1.727 m)  Wt 200 lb (90.719 kg)  BMI 30.42 kg/m2  SpO2 94% Physical Exam  Nursing note and vitals reviewed. Constitutional: He is oriented to person, place, and time. He appears well-developed and well-nourished. No distress.  HENT:  Head: Normocephalic and atraumatic.  Right Ear: External ear normal.  Left Ear: External ear normal.  Insect noted in right external ear canal along with copious cerumen. Unremarkable left external ear canal.  Eyes: Conjunctivae and EOM are normal.  Neck: Normal range of motion.  Cardiovascular: Normal rate and regular rhythm.  Exam reveals no gallop and no friction rub.   No murmur heard. Pulmonary/Chest: Effort normal and breath sounds normal. He has no wheezes. He has no rales. He exhibits no tenderness.  Musculoskeletal: Normal range of motion.  Neurological: He is alert and oriented to person, place, and time. Coordination normal.  Speech is goal-oriented. Moves limbs without ataxia.   Skin: Skin is warm and dry.  Psychiatric: He has a normal mood and affect. His behavior is normal.    ED Course  Irrigation Date/Time: 07/19/2013 6:57 PM Performed by: Emilia BeckSZEKALSKI, KAITLYN Authorized by: Emilia BeckSZEKALSKI,  KAITLYN Consent: Verbal consent obtained. Consent given by: patient Patient understanding: patient states understanding of the procedure being performed Patient consent: the patient's understanding of the procedure matches consent given Patient identity confirmed: verbally with patient Local anesthesia used: no Patient sedated: no Patient tolerance: Patient tolerated the procedure well with no immediate complications.  FOREIGN BODY REMOVAL Date/Time: 07/19/2013 6:58 PM Performed by: Emilia BeckSZEKALSKI, KAITLYN Authorized by: Emilia BeckSZEKALSKI, KAITLYN Consent: Verbal consent obtained. Consent given by: patient Patient understanding: patient states understanding of the procedure being performed Patient consent: the patient's understanding of the procedure matches consent given Patient identity confirmed: verbally with patient Body area: ear Location details: right ear Patient sedated: no Patient restrained: no Patient cooperative: yes Localization method: visualized Removal mechanism: irrigation and forceps Complexity: complex 1 objects recovered. Objects recovered: japanese beetle Post-procedure assessment: foreign body removed Patient tolerance: Patient tolerated the procedure well with no immediate complications.   (including critical care time) Labs Review Labs Reviewed - No data to display  Imaging Review No results found.   EKG Interpretation None      MDM   Final diagnoses:  Foreign body in ear, right, initial encounter    6:04 PM Patient has a bug in his left ear. Attempted to retrieve with alligator forceps. Patient will have ear irrigated due to unsuccessful attempt with forceps.   6:56 PM Reattempted bug removal with irrigation followed by forceps which was successful. No damage noted to TM. Patient will have antibiotic ear drops for infection prophylaxis. Vitals stable and patient afebrile.   Emilia BeckKaitlyn Szekalski, [place] JerseyPA-C 07/19/13 1904

## 2013-07-19 NOTE — ED Notes (Signed)
Pt states something (bug) flew in his ear.

## 2013-07-19 NOTE — Discharge Instructions (Signed)
Use ear drops for 3 days as directed. Refer to attached documents for more information.

## 2013-07-19 NOTE — ED Notes (Addendum)
Repeated flushes unable to dislodge foreign obj however did get a lot of wax out

## 2013-11-13 ENCOUNTER — Telehealth: Payer: Self-pay

## 2013-11-13 NOTE — Telephone Encounter (Signed)
[phone]  Call patient with Dr. Richelle ItoMcGees correct phone number.  He had to get his medication refilled from this doctor.

## 2013-11-14 NOTE — Telephone Encounter (Signed)
Patient called office for refills and was notified to call Dr. Lorenda Ishiharamojeed to reschedule

## 2014-01-03 ENCOUNTER — Encounter (HOSPITAL_COMMUNITY): Payer: Self-pay | Admitting: Cardiology

## 2014-05-06 ENCOUNTER — Encounter: Payer: Self-pay | Admitting: Cardiovascular Disease

## 2014-05-06 ENCOUNTER — Ambulatory Visit (INDEPENDENT_AMBULATORY_CARE_PROVIDER_SITE_OTHER): Payer: Medicare Other | Admitting: Cardiovascular Disease

## 2014-05-06 VITALS — BP 140/72 | HR 70 | Ht 67.0 in | Wt 196.0 lb

## 2014-05-06 DIAGNOSIS — I350 Nonrheumatic aortic (valve) stenosis: Secondary | ICD-10-CM

## 2014-05-06 HISTORY — DX: Nonrheumatic aortic (valve) stenosis: I35.0

## 2014-05-06 LAB — PROTIME-INR
INR: 1.1 ratio — ABNORMAL HIGH (ref 0.8–1.0)
PROTHROMBIN TIME: 11.9 s (ref 9.6–13.1)

## 2014-05-06 LAB — CBC
HEMATOCRIT: 43 % (ref 39.0–52.0)
HEMOGLOBIN: 14.3 g/dL (ref 13.0–17.0)
MCHC: 33.2 g/dL (ref 30.0–36.0)
MCV: 91.5 fl (ref 78.0–100.0)
PLATELETS: 152 10*3/uL (ref 150.0–400.0)
RBC: 4.7 Mil/uL (ref 4.22–5.81)
RDW: 15.3 % (ref 11.5–15.5)
WBC: 8.8 10*3/uL (ref 4.0–10.5)

## 2014-05-06 LAB — BASIC METABOLIC PANEL
BUN: 17 mg/dL (ref 6–23)
CHLORIDE: 103 meq/L (ref 96–112)
CO2: 30 meq/L (ref 19–32)
CREATININE: 1.38 mg/dL (ref 0.40–1.50)
Calcium: 9.6 mg/dL (ref 8.4–10.5)
GFR: 52.64 mL/min — ABNORMAL LOW (ref >60.00)
GLUCOSE: 140 mg/dL — AB (ref 70–99)
Potassium: 4.8 mEq/L (ref 3.5–5.1)
SODIUM: 139 meq/L (ref 135–145)

## 2014-05-06 NOTE — Progress Notes (Signed)
Cardiology Office Note   Date:  05/06/2014   ID:  Michael Wolfe, DOB 02-18-1931, MRN [id number]  PCP:  Michael Pane, MD  Cardiologist:  Rinaldo Cloud, MD    Chief Complaint  Patient presents with  . Appointment    TAVR consult     History of Present Illness: Michael Wolfe is a 81 y.o. male who presents for evaluation of severe aortic stenosis. The patient has a longstanding history of CAD. He had an inferoposterior MI in 2003 based on findings of a nuclear scan, but cardiac cath demonstrated diffuse moderate disease and medical therapy was recommended. In 2009 he presented with unstable angina and was found to have severe 3 vessel CAD at cardiac catheterization. He was seen by Dr Michael Wolfe in consultation for CABG, but he refused surgery. He ultimately was treated with multivessel stenting. His most recent heart catheterization in 2014 showed continued patency of the stented segments in the LAD and Left Circumflex.   The patient has been followed with severe aortic stenosis over the past few years. Because he has declined cardiac surgery, he's been treated medically. However, over recent months his symptoms have progressed and his last echocardiogram in November 2015 showed normal LV function with LVEF 50-55%, peak aortic velocity of > 4.5 m/s, and calculated aortic valve area < 0.5 square cm.   From a symptomatic perspective, the patient is extremely limited by shortness of breath which has been progressive over the past year. He used to walk 5 laps around his house, but can no longer walk more than 1 lap. He is unable to do any work in his yard. He has become increasingly sedentary and complains of worsening fatigue. He also experiences chest pain with exertion, at times taking 15-20 minutes to resolve with rest. He denies lightheadedness or syncope.  He is also limited by chronic pain in his back and knees. Other comorbid medical conditions include HTN, diabetes, and  hyperlipidemia. He has no history of stroke or bleeding problems.   The patient is married. 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 He has 3 sons.    Past Medical History  Diagnosis Date  . Diabetes mellitus without complication   . Hypertension   . Coronary artery disease   . Renal disorder     kidney stones  . Hyperlipidemia   . Osteoarthritis   . Severe aortic stenosis 05/06/2014    Past Surgical History  Procedure Laterality Date  . Cardiac surgery      stent placement  . Coronary angiogram  09/12/2012    Procedure: CORONARY ANGIOGRAM;  Surgeon: Michael Pane, MD;  Location[facility] C[facility] LAB;  Service: Cardiovascular;;    Current Outpatient Prescriptions  Medication Sig Dispense Refill  . acetaminophen (TYLENOL) 500 MG tablet Take 500 mg by mouth every 6 (six) hours as needed for pain.    Marland Kitchen amLODipine (NORVASC) 5 MG tablet Take 5 mg by mouth 2 (two) times daily.    Marland Kitchen aspirin 81 MG tablet Take 81 mg by mouth daily.    . clopidogrel (PLAVIX) 75 MG tablet Take 75 mg by mouth daily.    . fish oil-omega-3 fatty acids 1000 MG capsule Take 3 g by mouth daily.     Marland Kitchen glimepiride (AMARYL) 2 MG tablet Take 2 mg by mouth daily.     Marland Kitchen lisinopril (PRINIVIL,ZESTRIL) 40 MG tablet Take 40 mg by mouth daily.    . metFORMIN (GLUCOPHAGE) 500 MG tablet Take 500-1,000 mg by  mouth 2 (two) times daily with a meal. Takes 2 tablets in am and 1 tablet in pm    . metoprolol succinate (TOPROL-XL) 50 MG 24 hr tablet Take 50 mg by mouth daily. Take with or immediately following a meal.    . OVER THE COUNTER MEDICATION Take 1-2 tablets by mouth at bedtime as needed. For sleep. Equate brand Nighttime Sleep Aid    . rosuvastatin (CRESTOR) 20 MG tablet Take 20 mg by mouth daily.     No current facility-administered medications for this visit.    Allergies:   Review of patient's allergies indicates no known allergies.   Social History:  The patient  reports that he has never smoked. He does not have any  smokeless tobacco history on file. He reports that he does not drink alcohol or use illicit drugs.   Family History:  The patient's  family history includes Cancer in his mother; Hypertension in his father.    ROS:  Please see the history of present illness.  Otherwise, review of systems is positive for chest pain, leg swelling, orthopnea, PND, hearing loss, cough, abdominal pain, back pain, easy bruising, sweating, leg pain, palpitations, snoring, wheezing, constipation, nausea, vomiting, anxiety, joint swelling, and headaches.  All other systems are reviewed and negative.   PHYSICAL EXAM: VS:  BP 140/72 mmHg  Pulse 70  Ht 5\' 7"  (1.702 m)  Wt 196 lb (88.905 kg)  BMI 30.69 kg/m2 , BMI Body mass index is 30.69 kg/(m^2). GEN: Well nourished, well developed, elderly chronically ill-appearing male in no acute distress HEENT: normal Neck: no JVD, no masses. Delayed carotid upstrokes Cardiac: RRR with 3/6 harsh late-peaking systolic  Murmur LSB, absent A2                Respiratory:  clear to auscultation bilaterally, normal work of breathing GI: soft, nontender, nondistended, + BS MS: no deformity or atrophy Ext: trace pretibial edema Skin: warm and dry, no rash Neuro:  Strength and sensation are intact Psych: euthymic mood, full affect  EKG:  EKG is ordered today. The ekg ordered today shows NSR 70 bpm, 1st degree AV block, LVH with repolarization abnormality, age-indeterminate anteroseptal infarct.  Recent Labs: No results found for requested labs within last 365 days.   Lipid Panel     Component Value Date/Time   CHOL * 08/31/2007 0340    266        ATP III CLASSIFICATION:  <200     mg/dL   Desirable  [id number]  mg/dL   Borderline High  >=045>=240    mg/dL   High   TRIG 409653* 81/19/147808/06/2007 0340   HDL 36* 08/31/2007 0340   CHOLHDL 7.4 08/31/2007 0340   VLDL UNABLE TO CALCULATE IF TRIGLYCERIDE OVER 400 mg/dL 29/56/213008/06/2007 86570340   LDLCALC  08/31/2007 0340    UNABLE TO CALCULATE IF  TRIGLYCERIDE OVER 400 mg/dL        Total Cholesterol/HDL:CHD Risk Coronary Heart Disease Risk Table                     Men   Women  1/2 Average Risk   3.4   3.3      Wt Readings from Last 3 Encounters:  05/06/14 196 lb (88.905 kg)  07/19/13 200 lb (90.719 kg)  09/12/12 200 lb (90.719 kg)     STS Risk Calculator for Isolated AVR: Risk of Mortality: 4.4%  Morbidity or Mortality: 24.953%  Long Length of Stay: 11.81%  Short  Length of Stay: 21.386%  Permanent Stroke: 1.862%  Prolonged Ventilation: 16.127%  DSW Infection: 0.4%  Renal Failure: 8.934%  ASSESSMENT AND PLAN: 81 year-old gentleman with Stage D, Severe symptomatic aortic stenosis. The patient's exam and echo findings are consistent with critical aortic stenosis. He also suffers from symptoms of angina which could be related to aortic stenosis or underlying CAD. He clearly has progressive and now debilitating symptoms warranting intervention.   I have reviewed the natural history of aortic stenosis with the patient and his wife who is present today. We have discussed the limitations of medical therapy and the poor prognosis associated with symptomatic aortic stenosis. We have also reviewed potential treatment options, including palliative medical therapy, conventional surgical aortic valve replacement, and transcatheter aortic valve replacement. We discussed treatment options in the context of this patient's specific comorbid medical conditions.   The patient is at least at moderate risk of surgical AVR and I think his surgical risk is much higher than that predicted by the STS risk score as his functional status is quite poor. In addition, he was unwilling to have CABG when he was diagnosed with multivessel CAD and he clearly would not go through cardiac surgery now 7 years later with a declining functional capacity. However, he is willing to consider TAVR as a treatment alternative. I think TAVR could potentially be a good  treatment option for this patient.  We discussed next steps in evaluation for TAVR, which include the following:  Repeat 2D Echo  Left and right heart catheterization  CT angiography of the heart and a CTA of the chest/abdomen/pelvis  Formal cardiac surgical evaluation  Will arrange studies as outlined above. Heart catheterization will be arranged with Dr Sharyn Lull. Pre-cath labs were drawn today in the office and reviewed.   I have reviewed the risks, indications, and alternatives to cardiac catheterization with the patient. Risks include but are not limited to bleeding, infection, vascular injury, stroke, myocardial infection, arrhythmia, kidney injury, radiation-related injury in the case of prolonged fluoroscopy use, emergency cardiac surgery, and death. The patient understands the risks of serious complication is low (<1%).   Current medicines are reviewed with the patient today.  The patient does not have concerns regarding medicines.  The following changes have been made:  no change  Labs/ tests ordered today include:  No orders of the defined types were placed in this encounter.   Disposition:   Will arrange cardiac cath, imaging studies, and cardiac surgical evaluation.  Signed, Tonny Bollman, MD  05/06/2014 11:34 AM    [facility] [address] [place], [place], [place]  [address] Phone: [phone]; Fax: [phone]

## 2014-05-06 NOTE — Patient Instructions (Signed)
Medication Instructions:  Your physician recommends that you continue on your current medications as directed. Please refer to the Current Medication list given to you today.  Labwork: Your physician recommends that you have lab work today: BMP, CBC and PT/INR  Testing/Procedures: Your physician has requested that you have an echocardiogram. Echocardiography is a painless test that uses sound waves to create images of your heart. It provides your doctor with information about the size and shape of your heart and how well your heart's chambers and valves are working. This procedure takes approximately one hour. There are no restrictions for this procedure.  Follow-Up: Dr Annitta JerseyHarwani's office will contact you to arrange cardiac catheterization.   Any Other Special Instructions Will Be Listed Below (If Applicable).

## 2014-05-10 ENCOUNTER — Ambulatory Visit (HOSPITAL_COMMUNITY): Payer: Medicare Other | Attending: Cardiovascular Disease

## 2014-05-10 DIAGNOSIS — I350 Nonrheumatic aortic (valve) stenosis: Secondary | ICD-10-CM | POA: Diagnosis not present

## 2014-05-10 DIAGNOSIS — I359 Nonrheumatic aortic valve disorder, unspecified: Secondary | ICD-10-CM | POA: Diagnosis present

## 2014-05-10 NOTE — Progress Notes (Signed)
2D Echo completed. 05/10/2014 

## 2014-05-17 ENCOUNTER — Other Ambulatory Visit: Payer: Self-pay | Admitting: *Deleted

## 2014-05-17 ENCOUNTER — Telehealth: Payer: Self-pay

## 2014-05-17 DIAGNOSIS — I350 Nonrheumatic aortic (valve) stenosis: Secondary | ICD-10-CM

## 2014-05-17 NOTE — Telephone Encounter (Signed)
Dr Earmon Phoenixooper's interpretation of Echo: Severe aortic stenosis. Pt continues with TAVR evaluation  The pt came into the office because he got a new phone and has not figured out how it works.  He states that he missed a call from our office but did not know who called him.  I made the pt aware that I had tried to reach him with Echo results.  I made him aware of Echo findings and answered questions about cardiac catheterization.

## 2014-05-21 ENCOUNTER — Ambulatory Visit (HOSPITAL_COMMUNITY)
Admission: RE | Admit: 2014-05-21 | Discharge: 2014-05-21 | Disposition: A | Discharge status: Home / Self Care | Payer: Medicare Other | Source: Ambulatory Visit | Attending: Cardiology | Admitting: Cardiology

## 2014-05-21 ENCOUNTER — Encounter (HOSPITAL_COMMUNITY): Payer: Self-pay | Admitting: Cardiology

## 2014-05-21 ENCOUNTER — Encounter (HOSPITAL_COMMUNITY)
Admission: RE | Disposition: A | Discharge status: Home / Self Care | Payer: Self-pay | Source: Ambulatory Visit | Attending: Cardiology

## 2014-05-21 DIAGNOSIS — I2510 Atherosclerotic heart disease of native coronary artery without angina pectoris: Secondary | ICD-10-CM | POA: Diagnosis present

## 2014-05-21 DIAGNOSIS — N182 Chronic kidney disease, stage 2 (mild): Secondary | ICD-10-CM | POA: Insufficient documentation

## 2014-05-21 DIAGNOSIS — I129 Hypertensive chronic kidney disease with stage 1 through stage 4 chronic kidney disease, or unspecified chronic kidney disease: Secondary | ICD-10-CM | POA: Diagnosis not present

## 2014-05-21 DIAGNOSIS — E119 Type 2 diabetes mellitus without complications: Secondary | ICD-10-CM | POA: Diagnosis not present

## 2014-05-21 HISTORY — PX: LEFT AND RIGHT HEART CATHETERIZATION WITH CORONARY ANGIOGRAM: SHX5449

## 2014-05-21 LAB — GLUCOSE, CAPILLARY: GLUCOSE-CAPILLARY: 119 mg/dL — AB (ref 70–99)

## 2014-05-21 SURGERY — LEFT AND RIGHT HEART CATHETERIZATION WITH CORONARY ANGIOGRAM
Anesthesia: LOCAL

## 2014-05-21 MED ORDER — METFORMIN HCL 500 MG PO TABS: 500.0000 mg | ORAL_TABLET | Freq: Two times a day (BID) | ORAL | Status: DC

## 2014-05-21 MED ORDER — ASPIRIN 81 MG PO CHEW
CHEWABLE_TABLET | ORAL | Status: AC
  Filled 2014-05-21: qty 1

## 2014-05-21 MED ORDER — HEPARIN (PORCINE) IN NACL 2-0.9 UNIT/ML-% IJ SOLN
INTRAMUSCULAR | Status: AC
  Filled 2014-05-21: qty 1000

## 2014-05-21 MED ORDER — HEPARIN (PORCINE) IN NACL 2-0.9 UNIT/ML-% IJ SOLN
INTRAMUSCULAR | Status: AC
  Filled 2014-05-21: qty 500

## 2014-05-21 MED ORDER — SODIUM CHLORIDE 0.9 % IJ SOLN: 3.0000 mL | Freq: Two times a day (BID) | INTRAMUSCULAR | Status: DC

## 2014-05-21 MED ORDER — FENTANYL CITRATE (PF) 100 MCG/2ML IJ SOLN
INTRAMUSCULAR | Status: AC
  Filled 2014-05-21: qty 2

## 2014-05-21 MED ORDER — MIDAZOLAM HCL 2 MG/2ML IJ SOLN
INTRAMUSCULAR | Status: AC
  Filled 2014-05-21: qty 2

## 2014-05-21 MED ORDER — ONDANSETRON HCL 4 MG/2ML IJ SOLN: 4.0000 mg | Freq: Four times a day (QID) | INTRAMUSCULAR | Status: DC | PRN

## 2014-05-21 MED ORDER — LIDOCAINE HCL (PF) 1 % IJ SOLN
INTRAMUSCULAR | Status: AC
  Filled 2014-05-21: qty 30

## 2014-05-21 MED ORDER — ACETAMINOPHEN 325 MG PO TABS: 650.0000 mg | ORAL_TABLET | ORAL | Status: DC | PRN

## 2014-05-21 MED ORDER — SODIUM CHLORIDE 0.9 % IV SOLN: INTRAVENOUS | Status: AC

## 2014-05-21 MED ORDER — SODIUM CHLORIDE 0.9 % IV SOLN: 250.0000 mL | INTRAVENOUS | Status: DC | PRN

## 2014-05-21 MED ORDER — SODIUM CHLORIDE 0.9 % IV SOLN
INTRAVENOUS | Status: DC
  Administered 2014-05-21: 06:00:00 via INTRAVENOUS

## 2014-05-21 MED ORDER — SODIUM CHLORIDE 0.9 % IJ SOLN: 3.0000 mL | INTRAMUSCULAR | Status: DC | PRN

## 2014-05-21 MED ORDER — ASPIRIN 81 MG PO CHEW
81.0000 mg | CHEWABLE_TABLET | ORAL | Status: AC
  Administered 2014-05-21: 81 mg via ORAL

## 2014-05-21 NOTE — Interval H&P Note (Signed)
Cath Lab Visit (complete for each Cath Lab visit)  Clinical Evaluation Leading to the Procedure:   ACS: No.  Non-ACS:    Anginal Classification: CCS III  Anti-ischemic medical therapy: Maximal Therapy (2 or more classes of medications)  Non-Invasive Test Results: No non-invasive testing performed  Prior CABG: No previous CABG      History and Physical Interval Note:  05/21/2014 7:36 AM  Nadara ModeJames G Wernette  has presented today for surgery, with the diagnosis of cp  The various methods of treatment have been discussed with the patient and family. After consideration of risks, benefits and other options for treatment, the patient has consented to  Procedure(s): LEFT AND RIGHT HEART CATHETERIZATION WITH CORONARY ANGIOGRAM (N/A) as a surgical intervention .  The patient's history has been reviewed, patient examined, no change in status, stable for surgery.  I have reviewed the patient's chart and labs.  Questions were answered to the patient's satisfaction.     Robynn PaneHARWANI,Zackari Ruane N

## 2014-05-21 NOTE — Discharge Instructions (Signed)
° °  HOLD METFORMIN FOR 48 HOURS. RESUME Friday MORNING.  Arteriogram Care After These instructions give you information on caring for yourself after your procedure. Your doctor may also give you more specific instructions. Call your doctor if you have any problems or questions after your procedure. HOME CARE  Do not bathe, swim, or use a hot tub until directed by your doctor. You can shower.  Do not lift anything heavier than 10 pounds (about a gallon of milk) for 2 days.  Do not walk a lot, run, or drive for 2 days.  Return to normal activities in 2 days or as told by your doctor. Finding out the results of your test Ask when your test results will be ready. Make sure you get your test results. GET HELP RIGHT AWAY IF:   You have fever.  You have more pain in your leg.  The leg that was cut is:  Bleeding.  Puffy (swollen) or red.  Cold.  Pale or changes color.  Weak.  Tingly or numb. If you go to the Emergency Room, tell your nurse that you have had an arteriogram. Take this paper with you to show the nurse. MAKE SURE YOU:  Understand these instructions.  Will watch your condition.  Will get help right away if you are not doing well or get worse. Document Released: 04/09/2008 Document Revised: 01/16/2013 Document Reviewed: 04/09/2008 [facility] Patient Information 2015 [employer] [place]. This information is not intended to replace advice given to you by your health care provider. Make sure you discuss any questions you have with your health care provider.

## 2014-05-21 NOTE — Op Note (Signed)
NAME:  GARI, HARTSELL NO.:  [id number]  MEDICAL RECORD NO.:  [id number]  LOCATION:  [facility]                         FACILITY:  [facility]  PHYSICIAN:  Addisson Frate N. Sharyn Lull, M.D. DATE OF BIRTH:  03-09-1931  DATE OF PROCEDURE:  05/21/2014 DATE OF DISCHARGE:  05/21/2014                              OPERATIVE REPORT   PROCEDURES:  Right and left cardiac catheterization with selective left and right coronary angiography, insertion of Swan-Ganz catheter via right groin using Judkins technique.  INDICATION FOR THE PROCEDURE:  Mr. Duerson is 81 year old male with past medical history significant for coronary artery disease; history of non- Q-wave myocardial infarction in August of 2009; noted to have multivessel CAD, refused for CABG, subsequently underwent PCI to LAD and diagonal 1 followed by PCI to left circumflex; history of severe aortic stenosis, refused for AVR, treated medically; hypertension; diabetes mellitus; hypercholesteremia; chronic kidney disease, stage II; degenerative joint disease; complains of progressive increasing shortness of breath with minimal exertion associated with feeling weak, tired, and no energy.  The patient denies any PND, orthopnea or leg swelling.  Denies palpitation, lightheadedness, or syncope.  A 2D echo done in the office in November showed worsening aortic valve area of approximately 0.5 cm2 with mean gradient around 40 mmHg.  The patient initially refused for any procedures, but now agrees for transcutaneous aortic valve replacement.  The patient was seen by the Heart Team and is being evaluated for percutaneous aortic valve replacement.  Discussed with the patient regarding right and left cardiac catheterization, possible PCI prior to TAVR, its risks and benefits, i.e., death, MI, stroke, need for emergency CABG, local vascular complications, and consents for the procedure.  DESCRIPTION OF PROCEDURE:  After obtaining the informed  consent, the patient was brought to the Cath Lab and was placed on fluoroscopy table. Right groin was prepped and draped in usual fashion.  Xylocaine 1% was used for local anesthesia in the right groin.  With the help of thin wall needle, 5-French arterial and 7-French venous sheaths were placed. Both the sheaths were aspirated and flushed.  Next, 7-French Swan-Ganz catheter was advanced under fluoroscopic guidance up to the RA, RV, PA and wedge position.  Pressures were recorded in all those locations.  RA pressure was 7, RV pressure was 37/2, mean was 9, PA pressures were 30/9, mean 21, wedge pressure was 18.  Next, the Swan-Ganz catheter was pulled out.  Sheaths were aspirated and flushed.  Next, 5-French left Judkins catheter was advanced over the wire under fluoroscopic guidance up to the ascending aorta.  Wire was pulled out.  The catheter was aspirated and connected to the Manifold.  Catheter was further advanced and engaged into left coronary ostium.  Multiple views of the left system were taken.  Next, catheter was disengaged and was pulled out over the wire and was replaced with 5-French right Judkins catheter, which was advanced over the wire under fluoroscopic guidance up to the ascending aorta.  Wire was pulled out, the catheter was aspirated and connected to the Manifold.  Catheter was further advanced and engaged into right coronary ostium.  Multiple views of the right system were taken.  Next, catheter was  disengaged and was pulled out over the wire. Both the sheaths were aspirated and flushed.  FINDINGS:  Left main had 20% distal stenosis.  LAD has 20-25% ostial and 30-35% proximal edge stent restenosis, stented segment is widely patent. Diagonal 1 has 50-60% ostial stenosis followed by stented segment, which is widely patent.  Left circumflex has 10-15% proximal stenosis, stented segment is widely patent.  OM1 and OM2 were very, very small.  OM3 was large, which was  patent.  Left circumflex tapers down in AV groove.  RCA has 30-35% mid and distal stenosis.  PDA is very very small.  PLV branch has 75-80% distal stenosis.  Distally, the vessel is very small beyond the lesion.  The patient tolerated the procedure well.  There were no complications.  Plan is to maximize antianginal medications.  The patient will be referred back to Heart Team for consideration for TAVR.     Eduardo OsierMohan N. Sharyn LullHarwani, M.D.     [facility]  D:  05/21/2014  T:  05/21/2014  Job:  [id number]

## 2014-05-21 NOTE — H&P (Signed)
Typed H&P in the chart needs to be scanned 

## 2014-05-23 ENCOUNTER — Encounter: Payer: Self-pay | Admitting: Physical Therapy

## 2014-05-23 ENCOUNTER — Ambulatory Visit: Payer: Medicare Other | Attending: Cardiovascular Disease | Admitting: Physical Therapy

## 2014-05-23 DIAGNOSIS — Z955 Presence of coronary angioplasty implant and graft: Secondary | ICD-10-CM | POA: Insufficient documentation

## 2014-05-23 DIAGNOSIS — R269 Unspecified abnormalities of gait and mobility: Secondary | ICD-10-CM | POA: Diagnosis not present

## 2014-05-23 DIAGNOSIS — R0602 Shortness of breath: Secondary | ICD-10-CM | POA: Insufficient documentation

## 2014-05-23 DIAGNOSIS — I350 Nonrheumatic aortic (valve) stenosis: Secondary | ICD-10-CM | POA: Insufficient documentation

## 2014-05-23 NOTE — Therapy (Signed)
[facility] [address] [place], [place], [address] Phone: [phone]   Fax:  [phone]  Physical Therapy Evaluation  Patient Details  Name: Michael Wolfe MRN: [id number] Date of Birth: 1931-03-07 Referring Provider:  Georgann Housekeeper, MD  Encounter Date: 05/23/2014      PT End of Session - 05/23/14 1409    Visit Number 1   PT Start Time 1345   PT Stop Time 1450   PT Time Calculation (min) 65 min      Past Medical History  Diagnosis Date  . Diabetes mellitus without complication   . Hypertension   . Coronary artery disease   . Renal disorder     kidney stones  . Hyperlipidemia   . Osteoarthritis   . Severe aortic stenosis 05/06/2014    Past Surgical History  Procedure Laterality Date  . Cardiac surgery      stent placement  . Coronary angiogram  09/12/2012    Procedure: CORONARY ANGIOGRAM;  Surgeon: Robynn Pane, MD;  Location: [facility] CATH LAB;  Service: Cardiovascular;;  . Left and right heart catheterization with coronary angiogram N/A 05/21/2014    Procedure: LEFT AND RIGHT HEART CATHETERIZATION WITH CORONARY ANGIOGRAM;  Surgeon: Rinaldo Cloud, MD;  Location: [facility] CATH LAB;  Service: Cardiovascular;  Laterality: N/A;    There were no vitals filed for this visit.  Visit Diagnosis:  Abnormality of gait - Plan: PT PLAN OF CARE CERT/RE-CERT  Shortness of breath - Plan: PT PLAN OF CARE CERT/RE-CERT      Subjective Assessment - 05/23/14 1410    Subjective Pt does not report any specific recent change in symptoms related to aortic stenosis. Pt is concerned today about some swelling around his R inner groin from arteriogram on Tuesday.   Patient Stated Goals live longer   Currently in Pain? No/denies            [facility] PT Assessment - 05/23/14 0001    Assessment   Medical Diagnosis severe aortic stenosis   Onset Date 04/22/14  approximate - last MD visit   Precautions   Precautions None   Restrictions   Weight Bearing Restrictions No   Balance Screen   Has the patient fallen in the past 6 months No   Has the patient had a decrease in activity level because of a fear of falling?  No   Is the patient reluctant to leave their home because of a fear of falling?  No   Home Environment   Living Enviornment Private residence   Living Arrangements Spouse/significant other   Home Access Stairs to enter   Entrance Stairs-Number of Steps 12   Entrance Stairs-Rails Right;Left;Can reach both   Prior Function   Level of Independence Independent with gait   Observation/Other Assessments   Observations mild swelling and discoloration present at RLE inner groin from procedure Tuesday. No signs of infection present at time.    Posture/Postural Control   Posture/Postural Control Postural limitations   Postural Limitations Forward head;Rounded Shoulders   ROM / Strength   AROM / PROM / Strength AROM;Strength   AROM   Overall AROM  Within functional limits for tasks performed   Strength   Overall Strength Comments grossly 5/5   Strength Assessment Site Hand   Right/Left hand Right;Left   Grip (lbs) 40  R hand dominant   Grip (lbs) 40   Ambulation/Gait   Ambulation/Gait Yes          [facility] Pre-Surgical Assessment -  05/23/14 0001    5 Meter Walk Test- trial 1 5 sec   5 Meter Walk Test- trial 2 6 sec.    5 Meter Walk Test- trial 3 5 sec.  < 6 sec WNL   5 meter walk test average 5.33 sec   Timed Up & Go Test trial  12 sec.   Comments </= 12 sec WNL   4 Stage Balance Test tolerated for:  3 sec.   4 Stage Balance Test Position 4   comment not indicative of high fall risk   Sit To Stand Test- trial 1 14 sec.   Comment </= 14.8 WNL   ADL/IADL Independent with: Bathing;Dressing;Meal prep;Finances   ADL/IADL Needs Assistance with: [employer] work   ADL/IADL Fraility Index Vulnerable   6 Minute Walk- Baseline yes   BP (mmHg) 152/90 mmHg   HR (bpm) 59   02 Sat (%RA) 95 %   Modified Borg Scale for  Dyspnea 0- Nothing at all   Perceived Rate of Exertion (Borg) 6-   6 Minute Walk Post Test yes   BP (mmHg) 142/90 mmHg   HR (bpm) 101   02 Sat (%RA) 94 %   Modified Borg Scale for Dyspnea 3- Moderate shortness of breath or breathing difficulty   Perceived Rate of Exertion (Borg) 13- Somewhat hard   Aerobic Endurance Distance Walked 967   Endurance additional comments Pt did not require any seated rest breaks. HR significantly increased with 6 minute walk test as did PRE, and SOB.                  [facility] Adult PT Treatment/Exercise - 05/23/14 0001    Ambulation/Gait   Gait Comments No significant deviations. Pt demonstrated mild SOB with extended walking.                 PT Education - 05/23/14 1516    Education provided Yes   Education Details Call MD if swelling or discoloration around R inner groin worsens or if pain develops in that leg or if any further concerns.   Person(s) Educated Patient;Spouse                    Plan - 05/23/14 1518    Clinical Impression Statement Pt is an 81 yo male presenting to OP PT for mobility evaluation prior to TAVR surgery. Pt's overall mobility is good and he did not score at increased fall risk. Pt demonstrated mild to moderate SOB with 6 minute walk test. Pt's strength and ROM are WNL.    PT Frequency One time visit   Consulted and Agree with Plan of Care Patient          G-Codes - 05/23/14 1523    Functional Assessment Tool Used 6 minute walk 967'   Functional Limitation Mobility: Walking and moving around   Mobility: Walking and Moving Around Current Status [phone]([id number]) At least 1 percent but less than 20 percent impaired, limited or restricted   Mobility: Walking and Moving Around Goal Status [phone]([id number]) At least 1 percent but less than 20 percent impaired, limited or restricted   Mobility: Walking and Moving Around Discharge Status [phone]([id number]) At least 1 percent but less than 20 percent impaired, limited or restricted        Problem List Patient Active Problem List   Diagnosis Date Noted  . Severe aortic stenosis 05/06/2014    NICOLETTA,DANA, PT 05/23/2014, 3:25 PM  [facility] [address]  [place], [place], [address] Phone: [phone]   Fax:  [phone]

## 2014-05-28 ENCOUNTER — Ambulatory Visit (HOSPITAL_COMMUNITY)
Admission: RE | Admit: 2014-05-28 | Discharge: 2014-05-28 | Disposition: A | Discharge status: Home / Self Care | Payer: Medicare Other | Source: Ambulatory Visit | Attending: Cardiovascular Disease | Admitting: Cardiovascular Disease

## 2014-05-28 ENCOUNTER — Ambulatory Visit (HOSPITAL_COMMUNITY): Payer: Medicare Other

## 2014-05-28 DIAGNOSIS — I350 Nonrheumatic aortic (valve) stenosis: Secondary | ICD-10-CM

## 2014-05-28 LAB — PULMONARY FUNCTION TEST
DL/VA % pred: 88 %
DL/VA: 3.91 ml/min/mmHg/L
DLCO unc % pred: 34 %
DLCO unc: 9.67 ml/min/mmHg
FEF 25-75 Post: 3.09 L/sec
FEF 25-75 Pre: 2.29 L/sec
FEF2575-%Change-Post: 35 %
FEF2575-%PRED-POST: 183 %
FEF2575-%PRED-PRE: 136 %
FEV1-%Change-Post: 5 %
FEV1-%Pred-Post: 80 %
FEV1-%Pred-Pre: 75 %
FEV1-POST: 1.98 L
FEV1-PRE: 1.87 L
FEV1FVC-%CHANGE-POST: 2 %
FEV1FVC-%PRED-PRE: 115 %
FEV6-%Change-Post: 3 %
FEV6-%Pred-Post: 71 %
FEV6-%Pred-Pre: 69 %
FEV6-Post: 2.34 L
FEV6-Pre: 2.27 L
FEV6FVC-%Change-Post: 0 %
FEV6FVC-%Pred-Post: 107 %
FEV6FVC-%Pred-Pre: 107 %
FVC-%CHANGE-POST: 3 %
FVC-%Pred-Post: 66 %
FVC-%Pred-Pre: 64 %
FVC-PRE: 2.27 L
FVC-Post: 2.34 L
PRE FEV1/FVC RATIO: 82 %
Post FEV1/FVC ratio: 85 %
Post FEV6/FVC ratio: 100 %
Pre FEV6/FVC Ratio: 100 %
RV % PRED: 95 %
RV: 2.4 L
TLC % pred: 72 %
TLC: 4.69 L

## 2014-05-28 MED ORDER — ALBUTEROL SULFATE (2.5 MG/3ML) 0.083% IN NEBU
2.5000 mg | INHALATION_SOLUTION | Freq: Once | RESPIRATORY_TRACT | Status: AC
  Administered 2014-05-28: 2.5 mg via RESPIRATORY_TRACT

## 2014-05-28 MED ORDER — IOHEXOL 350 MG/ML SOLN
70.0000 mL | Freq: Once | INTRAVENOUS | Status: AC | PRN
  Administered 2014-05-28: 70 mL via INTRAVENOUS

## 2014-05-28 MED ORDER — IOHEXOL 350 MG/ML SOLN
80.0000 mL | Freq: Once | INTRAVENOUS | Status: AC | PRN
  Administered 2014-05-28: 80 mL via INTRAVENOUS

## 2014-05-29 ENCOUNTER — Other Ambulatory Visit: Payer: Self-pay | Admitting: *Deleted

## 2014-05-29 ENCOUNTER — Institutional Professional Consult (permissible substitution) (INDEPENDENT_AMBULATORY_CARE_PROVIDER_SITE_OTHER): Payer: Medicare Other | Admitting: Surgery

## 2014-05-29 ENCOUNTER — Encounter: Payer: Self-pay | Admitting: Surgery

## 2014-05-29 VITALS — BP 133/70 | HR 74 | Resp 16 | Ht 67.0 in | Wt 190.0 lb

## 2014-05-29 DIAGNOSIS — I350 Nonrheumatic aortic (valve) stenosis: Secondary | ICD-10-CM | POA: Diagnosis not present

## 2014-06-01 ENCOUNTER — Encounter: Payer: Self-pay | Admitting: Surgery

## 2014-06-01 NOTE — Progress Notes (Signed)
Patient ID: Michael Wolfe, male   DOB: [birth year], 81 y.o.   MRN: [id number]  [facility]  MULTIDISCIPLINARY HEART VALVE CLINIC    CARDIOTHORACIC SURGERY CONSULTATION REPORT  Referring Provider is Rinaldo Cloud, MD PCP is Rinaldo Cloud, MD  Chief Complaint  Patient presents with  . Aortic Stenosis    severe...eval for AVR vs TAVR...ECHO 05/10/14.Marland KitchenMarland KitchenCATHED 05/21/14    HPI:  The patient is an 81 year old gentleman with a long history of coronary artery disease who had an inferoposterior MI in 2003 based on findings of a nuclear scan. Cath showed moderate diffuse coronary disease and he was treated medically. In 2009 he presented with unstable angina and was found to have severe 3-vessel coronary disease by cath. He was seen by Dr. Maren Beach but the patient refused surgery and was treated with multivessel stenting. He did well with this and his cath in 2014 showed continued patency of the stents in the LAD and LCX. Over the past few years he has been followed for severe aortic stenosis and has refused surgical treatment. An echo in 11/2013 showed severe AS with a peak velocity over 4.5 m/sec. He has had progression of his symptoms over the past few months and has been getting short of breath with walking around in the house and is unable to do any significant activity. He reports worsening fatigue and some chest pain with mild exertion. He underwent repeat cath by Dr. Sharyn Lull on 05/21/2014 showing patent stents and otherwise non-obstructive coronary disease.  Past Medical History  Diagnosis Date  . Diabetes mellitus without complication   . Hypertension   . Coronary artery disease   . Renal disorder     kidney stones  . Hyperlipidemia   . Osteoarthritis   . Severe aortic stenosis 05/06/2014    Past Surgical History  Procedure Laterality Date  . Cardiac surgery      stent placement  . Coronary angiogram  09/12/2012    Procedure: CORONARY ANGIOGRAM;  Surgeon: Robynn Pane, MD;  Location: [facility] CATH LAB;  Service: Cardiovascular;;  . Left and right heart catheterization with coronary angiogram N/A 05/21/2014    Procedure: LEFT AND RIGHT HEART CATHETERIZATION WITH CORONARY ANGIOGRAM;  Surgeon: Rinaldo Cloud, MD;  Location: [facility] CATH LAB;  Service: Cardiovascular;  Laterality: N/A;    Family History  Problem Relation Age of Onset  . Hypertension Father   . Cancer Mother     History   Social History  . Marital Status: Married    Spouse Name: N/A  . Number of Children: N/A  . Years of Education: N/A   Occupational History  . Not on file.   Social History Main Topics  . Smoking status: Never Smoker   . Smokeless tobacco: Not on file  . Alcohol Use: No  . Drug Use: No  . Sexual Activity: Not on file   Other Topics Concern  . Not on file   Social History Narrative  Retired and lives with his wife.  Current Outpatient Prescriptions  Medication Sig Dispense Refill  . acetaminophen (TYLENOL) 500 MG tablet Take 500 mg by mouth every 6 (six) hours as needed for pain.    Marland Kitchen amLODipine (NORVASC) 5 MG tablet Take 5 mg by mouth 2 (two) times daily.    Marland Kitchen aspirin 81 MG tablet Take 81 mg by mouth daily.    . clopidogrel (PLAVIX) 75 MG tablet Take 75 mg by mouth daily.    . fish oil-omega-3 fatty  acids 1000 MG capsule Take 3 g by mouth daily.     Marland Kitchen. glimepiride (AMARYL) 2 MG tablet Take 2 mg by mouth daily.     Marland Kitchen. lisinopril (PRINIVIL,ZESTRIL) 40 MG tablet Take 40 mg by mouth daily.    . metFORMIN (GLUCOPHAGE) 500 MG tablet Take 500-1,000 mg by mouth 2 (two) times daily with a meal. Takes 2 tablets in am and 1 tablet in pm    . metoprolol succinate (TOPROL-XL) 50 MG 24 hr tablet Take 50 mg by mouth daily. Take with or immediately following a meal.    . OVER THE COUNTER MEDICATION Take 1-2 tablets by mouth at bedtime as needed. For sleep. Equate brand Nighttime Sleep Aid    . rosuvastatin (CRESTOR) 20 MG tablet Take 20 mg by mouth daily.     No current  facility-administered medications for this visit.    No Known Allergies    Review of Systems:   General:  normal appetite, decreased energy, no weight gain, no weight loss, no fever  Cardiac:  has chest pain with exertion, no chest pain at rest, has SOB with mild exertion,  no resting SOB, has PND, has orthopnea,  no palpitations, no arrhythmia, no atrial fibrillation, has LE edema, no dizzy spells, no syncope  Respiratory:  has shortness of breath,  no home oxygen, no productive cough, no dry cough, no bronchitis, has wheezing, no hemoptysis, no asthma, no pain with inspiration or cough, no sleep apnea, no CPAP at night  GI:   no difficulty swallowing, no reflux, no frequent heartburn, no hiatal hernia, occasional abdominal pain, no constipation, no diarrhea, no hematochezia, no hematemesis, no melena  GU:   no dysuria,  no frequency, no urinary tract infection, no hematuria, no enlarged prostate, has kidney stones, no kidney disease  Vascular:  no pain suggestive of claudication, no pain in feet, no leg cramps, no varicose veins, no DVT, no non-healing foot ulcer  Neuro:   no stroke, no TIA's, no seizures, has headaches, no temporary blindness one eye,  no slurred speech, no peripheral neuropathy, no chronic pain, some instability of gait, no memory/cognitive dysfunction  Musculoskeletal: has arthritis, no joint swelling, no myalgias, no difficulty walking, normal mobility   Skin:   no rash, no itching, no skin infections, no pressure sores or ulcerations  Psych:   has anxiety, no depression, no nervousness, no unusual recent stress  Eyes:   no blurry vision, no floaters, no recent vision changes,  wears glasses or contacts  ENT:   no hearing loss, no loose or painful teeth, wears dentures,   Hematologic:  no easy bruising, no abnormal bleeding, no clotting disorder, no frequent epistaxis  Endocrine:  has diabetes, does check CBG's at home           Physical Exam:   BP 133/70 mmHg   Pulse 74  Resp 16  Ht 5\' 7"  (1.702 m)  Wt 190 lb (86.183 kg)  BMI 29.75 kg/m2  SpO2 96%  General:  Elderly,  well-appearing  HEENT:  Unremarkable , NCAT, PERLA, EOMI, oropharynx clear  Neck:   no JVD, no bruits, no adenopathy or thyromegaly  Chest:   clear to auscultation, symmetrical breath sounds, no wheezes, no rhonchi   CV:   RRR, grade III/VI crescendo/decrescendo murmur heard best at RSB,  no diastolic murmur  Abdomen:  soft, non-tender, no masses or organomegaly  Extremities:  warm, well-perfused, pulses palpable, no LE edema  Rectal/GU  Deferred  Neuro:  Grossly non-focal and symmetrical throughout  Skin:   Clean and dry, no rashes, no breakdown   Diagnostic Tests:  PHYSICIAN: Mohan N. Sharyn Lull, M.D. DATE OF BIRTH: September 19, 1931  DATE OF PROCEDURE: 05/21/2014 DATE OF DISCHARGE: 05/21/2014   OPERATIVE REPORT   PROCEDURES: Right and left cardiac catheterization with selective left and right coronary angiography, insertion of Swan-Ganz catheter via right groin using Judkins technique.  INDICATION FOR THE PROCEDURE: Mr. Olden is 81 year old male with past medical history significant for coronary artery disease; history of non- Q-wave myocardial infarction in August of 2009; noted to have multivessel CAD, refused for CABG, subsequently underwent PCI to LAD and diagonal 1 followed by PCI to left circumflex; history of severe aortic stenosis, refused for AVR, treated medically; hypertension; diabetes mellitus; hypercholesteremia; chronic kidney disease, stage II; degenerative joint disease; complains of progressive increasing shortness of breath with minimal exertion associated with feeling weak, tired, and no energy. The patient denies any PND, orthopnea or leg swelling. Denies palpitation, lightheadedness, or syncope. A 2D echo done in the office in November showed worsening aortic valve area of approximately 0.5 cm2 with mean  gradient around 40 mmHg. The patient initially refused for any procedures, but now agrees for transcutaneous aortic valve replacement. The patient was seen by the Heart Team and is being evaluated for percutaneous aortic valve replacement. Discussed with the patient regarding right and left cardiac catheterization, possible PCI prior to TAVR, its risks and benefits, i.e., death, MI, stroke, need for emergency CABG, local vascular complications, and consents for the procedure.  DESCRIPTION OF PROCEDURE: After obtaining the informed consent, the patient was brought to the Cath Lab and was placed on fluoroscopy table. Right groin was prepped and draped in usual fashion. Xylocaine 1% was used for local anesthesia in the right groin. With the help of thin wall needle, 5-French arterial and 7-French venous sheaths were placed. Both the sheaths were aspirated and flushed. Next, 7-French Swan-Ganz catheter was advanced under fluoroscopic guidance up to the RA, RV, PA and wedge position. Pressures were recorded in all those locations. RA pressure was 7, RV pressure was 37/2, mean was 9, PA pressures were 30/9, mean 21, wedge pressure was 18. Next, the Swan-Ganz catheter was pulled out. Sheaths were aspirated and flushed. Next, 5-French left Judkins catheter was advanced over the wire under fluoroscopic guidance up to the ascending aorta. Wire was pulled out. The catheter was aspirated and connected to the Manifold. Catheter was further advanced and engaged into left coronary ostium. Multiple views of the left system were taken. Next, catheter was disengaged and was pulled out over the wire and was replaced with 5-French right Judkins catheter, which was advanced over the wire under fluoroscopic guidance up to the ascending aorta. Wire was pulled out, the catheter was aspirated and connected to the Manifold. Catheter was further advanced and engaged into right coronary ostium.  Multiple views of the right system were taken. Next, catheter was disengaged and was pulled out over the wire. Both the sheaths were aspirated and flushed.  FINDINGS: Left main had 20% distal stenosis. LAD has 20-25% ostial and 30-35% proximal edge stent restenosis, stented segment is widely patent. Diagonal 1 has 50-60% ostial stenosis followed by stented segment, which is widely patent. Left circumflex has 10-15% proximal stenosis, stented segment is widely patent. OM1 and OM2 were very, very small. OM3 was large, which was patent. Left circumflex tapers down in AV groove. RCA has 30-35% mid and distal stenosis. PDA is very very small.  PLV branch has 75-80% distal stenosis. Distally, the vessel is very small beyond the lesion. The patient tolerated the procedure well. There were no complications. Plan is to maximize antianginal medications. The patient will be referred back to Heart Team for consideration for TAVR.     Eduardo Osier. Sharyn Lull, M.D.   ADDENDUM REPORT: 05/28/2014 16:39  CLINICAL DATA: Aortic stenosis  EXAM: Cardiac TAVR CT  TECHNIQUE: The patient was scanned on a Philips 256 scanner. A 120 kV retrospective scan was triggered in the descending thoracic aorta at 111 HU's. Gantry rotation speed was 270 msecs and collimation was .9 mm. No beta blockade or nitro were given. The 3D data set was reconstructed in 5% intervals of the R-R cycle. Systolic and diastolic phases were analyzed on a dedicated work station using MPR, MIP and VRT modes. The patient received 80 cc of contrast.  FINDINGS: Aortic Valve: Trileaflet and heavily calcified small amount of nodular calcification at the base of the non coronary leaflet  Aorta: Tortuous and unfolded aortic arch. See radiology report regarding infrarenal AAA measuring 5.1 x 4.6 cm  Sinotubular Junction: 26 mm  Ascending Thoracic Aorta: 30 mm  Aortic Arch: 28 mm  Sinus of Valsalva  Measurements:  Non-coronary: 30.5 mm  Right -coronary: 29.5 mm  Left -coronary: 30.6 mm  Coronary Artery Height above Annulus:  Left Main: 12.8 mm  Right Coronary: 16 mm  Virtual Basal Annulus Measurements:  Maximum/Minimum Diameter: 27.3 mm x 19.5 mm  Perimeter: 74 mm  Area: 423 mm2  Coronary Arteries: Sufficient height above annulus for delivery  Optimum Fluoroscopic Angle for Delivery: Essentially AP LAO 1 degree Cranial 1 degree  IMPRESSION: 1) Calcified trileaflet aortic valve with annulus of 423 mm2 suitable for a 23 mm Sapien 3 valve  2) Mild nodular calcification of the base of the non coronary cusp  3) Sufficient coronary height above annulus for delivery  4) Optimum deployment angle essentially AP LAO 1 degree Cranial 1 degree  5) Tortuous and unfolded aortic arch. See radiology report regarding 5.1 x 4.6 cm infrarenal aneurysm Both may impact ease of transfemoral delivery  Charlton Haws   Electronically Signed  By: Charlton Haws M.D.  On: 05/28/2014 16:39      Study Result     EXAM: OVER-READ INTERPRETATION CT CHEST  The following report is an over-read performed by radiologist Dr. Royal Piedra [facility] on 05/28/2014. This over-read does not include interpretation of cardiac or coronary anatomy or pathology. The coronary calcium score/coronary CTA interpretation by the cardiologist is attached.  COMPARISON: No priors.  FINDINGS: A contemporaneously obtained CTA of the chest, abdomen and pelvis was performed today.  IMPRESSION: Please see separate dictation for contemporaneously performed CTA of the chest, abdomen and pelvis from today for full description of incidental noncardiac findings.  Electronically Signed: By: Trudie Reed M.D. On: 05/28/2014 12:09      Result History     CT Coronary Morp W/Cta Cor W/Score W/Ca W/Cm &/Or Wo/Cm (Order #[id number]) on  05/28/2014 - Order Result History Report          Vitals     Height Weight BMI (Calculated)    5\' 7"  (1.702 m) 190 lb (86.183 kg) 29.8      Interpretation Summary     EXAM: OVER-READ INTERPRETATION CT CHEST  The following report is an over-read performed by radiologist Dr. Royal Piedra [facility] on 05/28/2014. This over-read does not include interpretation of cardiac or coronary anatomy or pathology. The  coronary calcium score/coronary CTA interpretation by the cardiologist is attached.  COMPARISON: No priors.  FINDINGS: A contemporaneously obtained CTA of the chest, abdomen and pelvis was performed today.  IMPRESSION: Please see separate dictation for contemporaneously performed CTA of the chest, abdomen and pelvis from today for full description of incidental noncardiac findings.  Electronically Signed: By: Trudie Reed M.D. On: 05/28/2014 12:09     Addendum by Wendall Stade, MD on Tue May 28, 2014 4:42 PM    ADDENDUM REPORT: 05/28/2014 16:39  CLINICAL DATA: Aortic stenosis  EXAM: Cardiac TAVR CT  TECHNIQUE: The patient was scanned on a Philips 256 scanner. A 120 kV retrospective scan was triggered in the descending thoracic aorta at 111 HU's. Gantry rotation speed was 270 msecs and collimation was .9 mm. No beta blockade or nitro were given. The 3D data set was reconstructed in 5% intervals of the R-R cycle. Systolic and diastolic phases were analyzed on a dedicated work station using MPR, MIP and VRT modes. The patient received 80 cc of contrast.  FINDINGS: Aortic Valve: Trileaflet and heavily calcified small amount of nodular calcification at the base of the non coronary leaflet  Aorta: Tortuous and unfolded aortic arch. See radiology report regarding infrarenal AAA measuring 5.1 x 4.6 cm  Sinotubular Junction: 26 mm  Ascending Thoracic Aorta: 30 mm  Aortic Arch: 28 mm  Sinus of Valsalva  Measurements:  Non-coronary: 30.5 mm  Right -coronary: 29.5 mm  Left -coronary: 30.6 mm  Coronary Artery Height above Annulus:  Left Main: 12.8 mm  Right Coronary: 16 mm  Virtual Basal Annulus Measurements:  Maximum/Minimum Diameter: 27.3 mm x 19.5 mm  Perimeter: 74 mm  Area: 423 mm2  Coronary Arteries: Sufficient height above annulus for delivery  Optimum Fluoroscopic Angle for Delivery: Essentially AP LAO 1 degree Cranial 1 degree  IMPRESSION: 1) Calcified trileaflet aortic valve with annulus of 423 mm2 suitable for a 23 mm Sapien 3 valve  2) Mild nodular calcification of the base of the non coronary cusp  3) Sufficient coronary height above annulus for delivery  4) Optimum deployment angle essentially AP LAO 1 degree Cranial 1 degree  5) Tortuous and unfolded aortic arch. See radiology report regarding 5.1 x 4.6 cm infrarenal aneurysm Both may impact ease of transfemoral delivery  Charlton Haws   Electronically Signed  By: Charlton Haws M.D.  On: 05/28/2014 16:39      CLINICAL DATA: 81 year old male with severe aortic stenosis. Preprocedural study prior to potential transcatheter aortic valve replacement (TAVR).  EXAM: CT ANGIOGRAPHY CHEST, ABDOMEN AND PELVIS  TECHNIQUE: Multidetector CT imaging through the chest, abdomen and pelvis was performed using the standard protocol during bolus administration of intravenous contrast. Multiplanar reconstructed images and MIPs were obtained and reviewed to evaluate the vascular anatomy.  CONTRAST: 70mL OMNIPAQUE IOHEXOL 350 MG/ML SOLN  COMPARISON: CT of the abdomen and pelvis 12/22/2009.  FINDINGS: CTA CHEST FINDINGS  Mediastinum/Lymph Nodes: Heart size is borderline enlarged. There is no significant pericardial fluid, thickening or pericardial calcification. There is atherosclerosis of the thoracic aorta, the great vessels of the mediastinum and the coronary  arteries, including calcified atherosclerotic plaque in the left main, left anterior descending, left circumflex and right coronary arteries. Severely calcified aortic valve. Mild calcifications of the mitral valve and annulus. No pathologically enlarged mediastinal or hilar lymph nodes. Esophagus is unremarkable in appearance. No axillary lymphadenopathy.  Lungs/Pleura: 3 mm subpleural nodule in the periphery of the right upper lobe (image 33 of series 403). No  acute consolidative airspace disease. No pleural effusions. Mild scarring in the lung bases bilaterally.  Musculoskeletal/Soft Tissues: There are no aggressive appearing lytic or blastic lesions noted in the visualized portions of the skeleton.  CTA ABDOMEN AND PELVIS FINDINGS  Hepatobiliary: No cystic or solid hepatic lesions. No intra or extrahepatic biliary ductal dilatation. High attenuation material lying dependently in the gallbladder may reflect tiny gallstones and/or biliary sludge. No current findings to suggest an acute cholecystitis at this time.  Pancreas: No pancreatic mass. No pancreatic ductal dilatation. No pancreatic or peripancreatic fluid or inflammatory changes.  Spleen: Unremarkable.  Adrenals/Urinary Tract: Bilateral adrenal glands are normal in appearance. Mild multifocal cortical thinning in the kidneys bilaterally. Several sub cm low-attenuation lesions are noted in the kidneys bilaterally, too small to definitively characterize, but statistically favored to represent tiny cysts.  Stomach/Bowel: The appearance of the stomach is normal. No pathologic dilatation of small bowel or colon. A few scattered colonic diverticulae are noted, particularly in the sigmoid colon, without surrounding inflammatory changes to suggest an acute diverticulitis at this time.  Vascular/Lymphatic: Vascular findings and measurements pertinent to potential TAVR procedure, as detailed below. In addition,  there is fusiform aneurysmal dilatation of the infrarenal abdominal aorta, which measures up to 5.1 x 4.6 cm. Two right renal arteries bilaterally.  Reproductive: Prostate gland and seminal vesicles are unremarkable in appearance.  Other: No significant volume of ascites. No pneumoperitoneum.  Musculoskeletal: There are no aggressive appearing lytic or blastic lesions noted in the visualized portions of the skeleton.  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  Right Common Femoral Artery -  Minimal Diameter - 9.4 x 9.2 mm  Tortuosity - mild  Calcification - mild  LEFT PELVIS:  Left Common Iliac Artery -  Minimal Diameter - 12.1 x 12.7 mm  Tortuosity - mild  Calcification - mild  Left External Iliac Artery -  Minimal Diameter - 8.5 x 9.0 mm  Tortuosity - moderate  Calcification - mild  Left Common Femoral Artery -  Minimal Diameter - 8.0 x 9.2 mm  Tortuosity - mild  Calcification - mild  Review of the MIP images confirms the above findings.  IMPRESSION: 1. Vascular findings and measurements pertinent to potential TAVR procedure, as above. This patient does have suitable pelvic arterial access, on the left side. 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 2. In addition, there is fusiform aneurysmal dilatation of the infrarenal abdominal aorta which measures up to 5.1 x 4.6  cm. Recommend followup by abdomen and pelvis CTA in 3-6 months, and vascular surgery referral/consultation if not already obtained. This recommendation follows ACR consensus guidelines: White Paper of the ACR Incidental Findings Committee II on Vascular Findings. J Am Coll Radiol 2013; 10:789-794. 3. 3 mm subpleural nodule in the periphery of the right upper lobe is statistically most likely to represent a subpleural lymph node. If the patient is at high risk for bronchogenic carcinoma, follow-up  chest CT at 1 year is recommended. If the patient is at low risk, no follow-up is needed. This recommendation follows the consensus statement: Guidelines for Management of Small Pulmonary Nodules Detected on CT Scans: A Statement from the Fleischner Society as published in Radiology 2005; 237:395-400. 4. Mild colonic diverticulosis without findings to suggest acute diverticulitis at this time.   Electronically Signed  By: Trudie Reed M.D.  On: 05/28/2014 13:00     STS Risk Calculator for Isolated AVR: Risk of Mortality: 4.4%  Morbidity or Mortality: 24.953%  Long Length of Stay: 11.81%  Short Length of Stay: 21.386%  Permanent Stroke: 1.862%  Prolonged Ventilation: 16.127%  DSW Infection: 0.4%  Renal Failure: 8.934%   Impression:  81 year-old gentleman with Stage D, severe symptomatic aortic stenosis. This has been present for a while and he has refused surgical AVR in the past but his symptoms are progressively worsening and he is unable to do much activity at this time. His cardiac cath shows patent stents and otherwise non-obstructive coronary disease. I agree that aortic valve replacement is indicated for this patient. I have reviewed the natural history of aortic stenosis with the patient and his wife who is present today. We have discussed the limitations of medical therapy and the poor prognosis associated with symptomatic aortic stenosis. We have also reviewed  potential treatment options, including palliative medical therapy, conventional surgical aortic valve replacement, and transcatheter aortic valve replacement. We discussed treatment options in the context of this patient's specific comorbid medical conditions.  The patient is at least at moderate risk of surgical AVR and I think his surgical risk is much higher than that predicted by the STS risk score due to his age and current declining functional status. In addition, he was unwilling to have CABG when he was diagnosed with multivessel CAD and he refuses to go through open cardiac surgery now.  However, he is willing to undergo TAVR as a treatment alternative. I think TAVR would be the best treatment option for this patient and would give him the best chance of making a functional recovery at his age in his current condition.  I have personally reviewed his cath, echo, gated cardiac CT, and CTA of the chest, abdomen and pelvis. He is a candidate for TAVR with a 23 mm Edwards S3 valve via a left transfemoral route.   I have discussed what types of management strategies would be attempted intraoperatively in the event of life-threatening complications, including whether or not the patient would be considered a candidate for the use of cardiopulmonary bypass and/or conversion to open sternotomy for attempted surgical intervention.  The patient has been advised of a variety of complications that might develop including but not limited to risks of death, stroke, paravalvular leak, aortic dissection or other major vascular complications, aortic annulus rupture, device embolization, cardiac rupture or perforation, mitral regurgitation, acute myocardial infarction, arrhythmia, heart block or bradycardia requiring permanent pacemaker placement, congestive heart failure, respiratory failure, renal failure, pneumonia, infection, other late complications related to structural valve deterioration or migration, or other  complications that might ultimately cause a temporary or permanent loss of functional independence or other long term morbidity.  The patient provides full informed consent for the procedure as described and all questions were answered.   Plan:  He will be scheduled to see Dr. Cornelius Moras for a second surgical opinion and if Dr. Cornelius Moras agrees we will schedule for TAVR.    Alleen Borne, MD  I spent 80  minutes performing this consultation and > 50% of this time was spent face to face counseling and coordinating the care of this patient's severe aortic stenosis.

## 2014-06-06 ENCOUNTER — Other Ambulatory Visit: Payer: Self-pay | Admitting: *Deleted

## 2014-06-06 DIAGNOSIS — I350 Nonrheumatic aortic (valve) stenosis: Secondary | ICD-10-CM

## 2014-06-25 ENCOUNTER — Encounter: Payer: Self-pay | Admitting: Thoracic Surgery (Cardiothoracic Vascular Surgery)

## 2014-06-25 ENCOUNTER — Institutional Professional Consult (permissible substitution) (INDEPENDENT_AMBULATORY_CARE_PROVIDER_SITE_OTHER): Payer: Medicare Other | Admitting: Thoracic Surgery (Cardiothoracic Vascular Surgery)

## 2014-06-25 VITALS — BP 126/70 | HR 55 | Resp 20 | Ht 67.0 in | Wt 196.0 lb

## 2014-06-25 DIAGNOSIS — I350 Nonrheumatic aortic (valve) stenosis: Secondary | ICD-10-CM

## 2014-06-25 NOTE — Patient Instructions (Signed)
   Patient should continue taking all medications without change through the day before surgery.  Patient should have nothing to eat or drink after midnight the night before surgery.  On the morning of surgery patient should take only Toprol XL with a sip of water.    

## 2014-06-25 NOTE — Progress Notes (Signed)
[facility]  MULTIDISCIPLINARY HEART VALVE CLINIC    CARDIOTHORACIC SURGERY CONSULTATION REPORT  Referring Provider is Rinaldo Cloud, MD PCP is Rinaldo Cloud, MD  Chief Complaint  Patient presents with  . Aortic Stenosis    2ND TAVR eval, surgery sch'ed for 07/02/2014    HPI:  Patient is an 81 year old male with long-standing history of coronary artery disease, aortic stenosis, hypertension, infrarenal AAA, type 2 diabetes mellitus, and hyperlipidemia who has been referred for a second surgical consultation to discuss treatment options for management of severe symptomatically aortic stenosis. The patient's cardiac history dates back to 2003 at which time he suffered an acute inferior wall myocardial infarction. Cardiac catheterization at that time revealed moderate diffuse coronary artery disease. He was treated medically without intervention. In 2009 the patient presented with unstable angina. Cardiac catheterization performed at that time demonstrated three-vessel coronary artery disease. The patient was referred for possible surgical revascularization and evaluated by Dr. Donata Clay.  The patient declined surgery and was subsequently treated using multivessel PCI and stenting of both the left anterior descending coronary artery and the left circumflex coronary artery. Over the past 2 years the patient has developed aortic stenosis that has progressed on serial follow-up echocardiograms.  Over the last 6 months the patient has developed symptoms of exertional shortness of breath and chest discomfort.  Most recent echocardiogram performed 05/10/2014 reveals severe aortic stenosis with peak velocity across the aortic valve measured approximately 4.5 m/s corresponding to peak to peak and mean transvalvular gradient estimated 80 and 47 mmHg, respectively. Left ventricular systolic function remains preserved with ejection fraction estimated 55-60%.  Follow-up cardiac catheterization  performed by Dr. Sharyn Lull  05/21/2014 demonstrates three-vessel coronary artery disease with continued patency in the stents in both the left anterior descending and left circumflex coronary artery territories. There was otherwise moderate diffuse nonobstructive coronary artery disease. The patient has been evaluated by a multidisciplinary team of specialists including Dr. Excell Seltzer and Dr. Laneta Simmers. The patient is felt to be relatively high risk for conventional surgical aortic valve replacement and transcatheter aortic valve replacement has been discussed as a possible treatment alternatives. The patient has been referred for a second surgical opinion to discuss options further.   The patient is married and lives in [place] with his wife. He has 3 grown sons who do not live close by. The patient has been retired from the [employer] for more than 15 years.  The patient remains entirely functionally independent.  He has been limited recently by progressive symptoms of exertional chest tightness and shortness of breath. He states that symptoms do not occur with ordinary activities around the house, but he has had to limit his physical activities considerably in recent months. He denies any episodes of chest pain or shortness of breath at rest. He has not been having nocturnal symptoms. He denies any history of PND, orthopnea, palpitations, dizzy spells, syncope, or lower extremity edema.  Past Medical History  Diagnosis Date  . Diabetes mellitus without complication   . Hypertension   . Coronary artery disease   . Renal disorder     kidney stones  . Hyperlipidemia   . Osteoarthritis   . Severe aortic stenosis 05/06/2014    Past Surgical History  Procedure Laterality Date  . Cardiac surgery      stent placement  . Coronary angiogram  09/12/2012    Procedure: CORONARY ANGIOGRAM;  Surgeon: Robynn Pane, MD;  Location: [facility] CATH LAB;  Service: Cardiovascular;;  .  Left and right heart  catheterization with coronary angiogram N/A 05/21/2014    Procedure: LEFT AND RIGHT HEART CATHETERIZATION WITH CORONARY ANGIOGRAM;  Surgeon: Rinaldo Cloud, MD;  Location: [facility] CATH LAB;  Service: Cardiovascular;  Laterality: N/A;    Family History  Problem Relation Age of Onset  . Hypertension Father   . Cancer Mother     History   Social History  . Marital Status: Married    Spouse Name: N/A  . Number of Children: N/A  . Years of Education: N/A   Occupational History  . Not on file.   Social History Main Topics  . Smoking status: Never Smoker   . Smokeless tobacco: Not on file  . Alcohol Use: No  . Drug Use: No  . Sexual Activity: Not on file   Other Topics Concern  . Not on file   Social History Narrative    Current Outpatient Prescriptions  Medication Sig Dispense Refill  . acetaminophen (TYLENOL) 500 MG tablet Take 500 mg by mouth every 6 (six) hours as needed for pain.    Marland Kitchen amLODipine (NORVASC) 5 MG tablet Take 5 mg by mouth 2 (two) times daily.    Marland Kitchen aspirin 81 MG tablet Take 81 mg by mouth daily.    . fish oil-omega-3 fatty acids 1000 MG capsule Take 3 g by mouth daily.     Marland Kitchen glimepiride (AMARYL) 2 MG tablet Take 2 mg by mouth daily.     Marland Kitchen lisinopril (PRINIVIL,ZESTRIL) 40 MG tablet Take 40 mg by mouth daily.    . metFORMIN (GLUCOPHAGE) 500 MG tablet Take 500-1,000 mg by mouth 2 (two) times daily with a meal. Takes 2 tablets in am and 1 tablet in pm    . metoprolol succinate (TOPROL-XL) 50 MG 24 hr tablet Take 50 mg by mouth daily. Take with or immediately following a meal.    . OVER THE COUNTER MEDICATION Take 1-2 tablets by mouth at bedtime as needed. For sleep. Equate brand Nighttime Sleep Aid    . rosuvastatin (CRESTOR) 20 MG tablet Take 20 mg by mouth daily.    . clopidogrel (PLAVIX) 75 MG tablet Take 75 mg by mouth daily.     No current facility-administered medications for this visit.    No Known Allergies    Review of Systems:   General:  normal  appetite, normal energy, no weight gain, no weight loss, no fever  Cardiac:  + chest pain with exertion, no chest pain at rest, + SOB with exertion, no resting SOB, no PND, no orthopnea, no palpitations, no arrhythmia, no atrial fibrillation, no LE edema, no dizzy spells, no syncope  Respiratory:  no shortness of breath, no home oxygen, no productive cough, occasional dry cough, no bronchitis, no wheezing, no hemoptysis, no asthma, no pain with inspiration or cough, no sleep apnea, no CPAP at night  GI:   no difficulty swallowing, no reflux, no frequent heartburn, no hiatal hernia, no abdominal pain, occasional constipation, no diarrhea, no hematochezia, no hematemesis, no melena  GU:   no dysuria,  no frequency, no urinary tract infection, no hematuria, no enlarged prostate, + kidney stones, no kidney disease  Vascular:  no pain suggestive of claudication, no pain in feet, no leg cramps, no varicose veins, no DVT, no non-healing foot ulcer  Neuro:   no stroke, no TIA's, no seizures, no headaches, no temporary blindness one eye,  no slurred speech, no peripheral neuropathy, no chronic pain, no instability of gait, no memory/cognitive dysfunction  Musculoskeletal: mild arthritis, no joint swelling, no myalgias, no difficulty walking, normal mobility   Skin:   no rash, no itching, no skin infections, no pressure sores or ulcerations  Psych:   no anxiety, no depression, no nervousness, no unusual recent stress  Eyes:   no blurry vision, no floaters, no recent vision changes, + wears glasses or contacts  ENT:   no hearing loss, no loose or painful teeth, + full set of dentures, last saw dentist many years ago  Hematologic:  + easy bruising, no abnormal bleeding, no clotting disorder, no frequent epistaxis  Endocrine:  + diabetes, does not check CBG's at home           Physical Exam:   BP 126/70 mmHg  Pulse 55  Resp 20  Ht 5\' 7"  (1.702 m)  Wt 196 lb (88.905 kg)  BMI 30.69 kg/m2  SpO2  97%  General:  Elderly but o/w  well-appearing  HEENT:  Unremarkable   Neck:   no JVD, no bruits, no adenopathy   Chest:   clear to auscultation, symmetrical breath sounds, no wheezes, no rhonchi   CV:   RRR, grade II/VI crescendo/decrescendo murmur heard best at LLSB,  no diastolic murmur  Abdomen:  soft, non-tender, no masses   Extremities:  warm, well-perfused, pulses palpable, no LE edema  Rectal/GU  Deferred  Neuro:   Grossly non-focal and symmetrical throughout  Skin:   Clean and dry, no rashes, no breakdown   Diagnostic Tests:  Transthoracic Echocardiography  Patient:  Ercell, Razon MR #:    [id number] Study Date: 05/10/2014 Gender:   M Age:    82 Height:   170.2 cm Weight:   88.9 kg BSA:    2.08 m^2 Pt. Status: Room:  ORDERING   Tonny Bollman REFERRING  Tonny Bollman SONOGRAPHER Dewitt Hoes, RDCS ATTENDING  Thurmon Fair, MD PERFORMING  [facility], Outpatient  cc:  ------------------------------------------------------------------- LV EF: 55% -  60%  ------------------------------------------------------------------- Indications:   Aortic stenosis (I35.0).  ------------------------------------------------------------------- History:  PMH:  Coronary artery disease. Risk factors: Osteoarthritis. Hypertension. Diabetes mellitus. Dyslipidemia.  ------------------------------------------------------------------- Study Conclusions  - Left ventricle: The cavity size was mildly dilated. Wall thickness was increased in a pattern of severe LVH. Systolic function was normal. The estimated ejection fraction was in the range of 55% to 60%. Doppler parameters are consistent with abnormal left ventricular relaxation (grade 1 diastolic dysfunction). Doppler parameters are consistent with elevated ventricular end-diastolic filling pressure. - Aortic valve: There was severe stenosis. Valve area (VTI): 0.74 cm^2. Valve  area (Vmean): 0.69 cm^2. - Mitral valve: Calcified annulus. There was mild regurgitation. - Left atrium: The atrium was moderately dilated. - Atrial septum: No defect or patent foramen ovale was identified.  Transthoracic echocardiography. M-mode, complete 2D, spectral Doppler, and color Doppler. Birthdate: Patient birthdate: Mar 30, 1931. Age: Patient is 81 yr old. Sex: Gender: male. BMI: 30.7 kg/m^2. Blood pressure:   140/72 Patient status: Outpatient. Study date: Study date: 05/10/2014. Study time: 09:21 AM. Location: [facility]  -------------------------------------------------------------------  ------------------------------------------------------------------- Left ventricle: The cavity size was mildly dilated. Wall thickness was increased in a pattern of severe LVH. Systolic function was normal. The estimated ejection fraction was in the range of 55% to 60%. Doppler parameters are consistent with abnormal left ventricular relaxation (grade 1 diastolic dysfunction). Doppler parameters are consistent with elevated ventricular end-diastolic filling pressure.  ------------------------------------------------------------------- Aortic valve:  Moderately calcified leaflets. Doppler:  There was severe stenosis.   VTI ratio of LVOT to aortic  valve: 0.29. Valve area (VTI): 0.74 cm^2. Indexed valve area (VTI): 0.36 cm^2/m^2. Mean velocity ratio of LVOT to aortic valve: 0.27. Valve area (Vmean): 0.69 cm^2. Indexed valve area (Vmean): 0.33 cm^2/m^2.  Mean gradient (S): 47 mm Hg. Peak gradient (S): 80 mm Hg.  ------------------------------------------------------------------- Aorta: The aorta was normal, not dilated, and non-diseased.  ------------------------------------------------------------------- Mitral valve:  Calcified annulus. Doppler: There was mild regurgitation.  Peak gradient (D): 4 mm  Hg.  ------------------------------------------------------------------- Left atrium: The atrium was moderately dilated.  ------------------------------------------------------------------- Atrial septum: No defect or patent foramen ovale was identified.  ------------------------------------------------------------------- Right ventricle: The cavity size was normal. Wall thickness was normal. Systolic function was normal.  ------------------------------------------------------------------- Pulmonic valve:  Doppler: There was mild regurgitation.  ------------------------------------------------------------------- Tricuspid valve:  Doppler: There was mild regurgitation.  ------------------------------------------------------------------- Right atrium: The atrium was normal in size.  ------------------------------------------------------------------- Pericardium: The pericardium was normal in appearance.  ------------------------------------------------------------------- Post procedure conclusions Ascending Aorta:  - The aorta was normal, not dilated, and non-diseased.  ------------------------------------------------------------------- Measurements  Left ventricle              Value     Reference LV ID, ED, PLAX chordal      (L)   39  mm    43 - 52 LV ID, ES, PLAX chordal          25  mm    23 - 38 LV fx shortening, PLAX chordal      36  %    >=29 LV PW thickness, ED            18  mm    --------- IVS/LV PW ratio, ED            1       <=1.3 Stroke volume, 2D             84  ml    --------- Stroke volume/bsa, 2D           40  ml/m^2  --------- LV e&', lateral              5.91 cm/s   --------- LV E/e&', lateral             16.58     --------- LV e&', medial               4.47 cm/s    --------- LV E/e&', medial              21.92     --------- LV e&', average              5.19 cm/s   --------- LV E/e&', average             18.88     ---------  Ventricular septum            Value     Reference IVS thickness, ED             18  mm    ---------  LVOT                   Value     Reference LVOT ID, S                18  mm    --------- LVOT area                 2.54 cm^2   --------- LVOT mean velocity, S  87.3 cm/s   --------- LVOT VTI, S                32.9 cm    ---------  Aortic valve               Value     Reference Aortic valve peak velocity, S       448  cm/s   --------- Aortic valve mean velocity, S       321  cm/s   --------- Aortic valve VTI, S            113  cm    --------- Aortic mean gradient, S          47  mm Hg  --------- Aortic peak gradient, S          80  mm Hg  --------- VTI ratio, LVOT/AV            0.29      --------- Aortic valve area, VTI          0.74 cm^2   --------- Aortic valve area/bsa, VTI        0.36 cm^2/m^2 --------- Velocity ratio, mean, LVOT/AV       0.27      --------- Aortic valve area, mean velocity     0.69 cm^2   --------- Aortic valve area/bsa, mean        0.33 cm^2/m^2 --------- velocity  Aorta                   Value     Reference Aortic root ID, ED            33  mm    --------- Ascending aorta ID, A-P, S        35  mm    ---------  Left atrium                Value     Reference LA ID, A-P, ES              47  mm    --------- LA ID/bsa, A-P          (H)    2.26 cm/m^2  <=2.2 LA volume, S               98.2 ml    --------- LA volume/bsa, S             47.3 ml/m^2  --------- LA volume, ES, 1-p A4C          73.1 ml    --------- LA volume/bsa, ES, 1-p A4C        35.2 ml/m^2  --------- LA volume, ES, 1-p A2C          126  ml    --------- LA volume/bsa, ES, 1-p A2C        60.7 ml/m^2  ---------  Mitral valve               Value     Reference Mitral E-wave peak velocity        98  cm/s   --------- Mitral deceleration time     (H)   264  ms    150 - 230 Mitral peak gradient, D          4   mm Hg  --------- Mitral E/A ratio, peak          0.8      ---------  Systemic veins  Value     Reference Estimated CVP               3   mm Hg  ---------  Right ventricle              Value     Reference RV s&', lateral, S             10.5 cm/s   ---------  Pulmonic valve              Value     Reference Pulmonic regurg velocity, ED       111  cm/s   ---------  Legend: (L) and (H) mark values outside specified reference range.  ------------------------------------------------------------------- Prepared and Electronically Authenticated by  Charlton Haws, M.D. 2016-04-15T10:29:01   Cardiac TAVR CT  TECHNIQUE: The patient was scanned on a Philips 256 scanner. A 120 kV retrospective scan was triggered in the descending thoracic aorta at 111 HU's. Gantry rotation speed was 270 msecs and collimation was .9 mm. No beta blockade or nitro were given. The 3D data set was reconstructed in 5% intervals of the R-R cycle. Systolic and diastolic phases were analyzed on a dedicated work station using MPR, MIP and VRT modes. The patient received 80 cc of  contrast.  FINDINGS: Aortic Valve: Trileaflet and heavily calcified small amount of nodular calcification at the base of the non coronary leaflet  Aorta: Tortuous and unfolded aortic arch. See radiology report regarding infrarenal AAA measuring 5.1 x 4.6 cm  Sinotubular Junction: 26 mm  Ascending Thoracic Aorta: 30 mm  Aortic Arch: 28 mm  Sinus of Valsalva Measurements:  Non-coronary: 30.5 mm  Right -coronary: 29.5 mm  Left -coronary: 30.6 mm  Coronary Artery Height above Annulus:  Left Main: 12.8 mm  Right Coronary: 16 mm  Virtual Basal Annulus Measurements:  Maximum/Minimum Diameter: 27.3 mm x 19.5 mm  Perimeter: 74 mm  Area: 423 mm2  Coronary Arteries: Sufficient height above annulus for delivery  Optimum Fluoroscopic Angle for Delivery: Essentially AP LAO 1 degree Cranial 1 degree  IMPRESSION: 1) Calcified trileaflet aortic valve with annulus of 423 mm2 suitable for a 23 mm Sapien 3 valve  2) Mild nodular calcification of the base of the non coronary cusp  3) Sufficient coronary height above annulus for delivery  4) Optimum deployment angle essentially AP LAO 1 degree Cranial 1 degree  5) Tortuous and unfolded aortic arch. See radiology report regarding 5.1 x 4.6 cm infrarenal aneurysm Both may impact ease of transfemoral delivery  Charlton Haws   Electronically Signed  By: Charlton Haws M.D.  On: 05/28/2014 16:39    CT ANGIOGRAPHY CHEST, ABDOMEN AND PELVIS  TECHNIQUE: Multidetector CT imaging through the chest, abdomen and pelvis was performed using the standard protocol during bolus administration of intravenous contrast. Multiplanar reconstructed images and MIPs were obtained and reviewed to evaluate the vascular anatomy.  CONTRAST: 70mL OMNIPAQUE IOHEXOL 350 MG/ML SOLN  COMPARISON: CT of the abdomen and pelvis 12/22/2009.  FINDINGS: CTA CHEST FINDINGS  Mediastinum/Lymph Nodes: Heart  size is borderline enlarged. There is no significant pericardial fluid, thickening or pericardial calcification. There is atherosclerosis of the thoracic aorta, the great vessels of the mediastinum and the coronary arteries, including calcified atherosclerotic plaque in the left main, left anterior descending, left circumflex and right coronary arteries. Severely calcified aortic valve. Mild calcifications of the mitral valve and annulus. No pathologically enlarged mediastinal or hilar lymph nodes. Esophagus is unremarkable in appearance. No axillary lymphadenopathy.  Lungs/Pleura:  3 mm subpleural nodule in the periphery of the right upper lobe (image 33 of series 403). No acute consolidative airspace disease. No pleural effusions. Mild scarring in the lung bases bilaterally.  Musculoskeletal/Soft Tissues: There are no aggressive appearing lytic or blastic lesions noted in the visualized portions of the skeleton.  CTA ABDOMEN AND PELVIS FINDINGS  Hepatobiliary: No cystic or solid hepatic lesions. No intra or extrahepatic biliary ductal dilatation. High attenuation material lying dependently in the gallbladder may reflect tiny gallstones and/or biliary sludge. No current findings to suggest an acute cholecystitis at this time.  Pancreas: No pancreatic mass. No pancreatic ductal dilatation. No pancreatic or peripancreatic fluid or inflammatory changes.  Spleen: Unremarkable.  Adrenals/Urinary Tract: Bilateral adrenal glands are normal in appearance. Mild multifocal cortical thinning in the kidneys bilaterally. Several sub cm low-attenuation lesions are noted in the kidneys bilaterally, too small to definitively characterize, but statistically favored to represent tiny cysts.  Stomach/Bowel: The appearance of the stomach is normal. No pathologic dilatation of small bowel or colon. A few scattered colonic diverticulae are noted, particularly in the sigmoid colon, without  surrounding inflammatory changes to suggest an acute diverticulitis at this time.  Vascular/Lymphatic: Vascular findings and measurements pertinent to potential TAVR procedure, as detailed below. In addition, there is fusiform aneurysmal dilatation of the infrarenal abdominal aorta, which measures up to 5.1 x 4.6 cm. Two right renal arteries bilaterally.  Reproductive: Prostate gland and seminal vesicles are unremarkable in appearance.  Other: No significant volume of ascites. No pneumoperitoneum.  Musculoskeletal: There are no aggressive appearing lytic or blastic lesions noted in the visualized portions of the skeleton.  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  Right Common Femoral Artery -  Minimal Diameter - 9.4 x 9.2 mm  Tortuosity - mild  Calcification - mild  LEFT PELVIS:  Left Common Iliac Artery -  Minimal Diameter - 12.1 x 12.7 mm  Tortuosity - mild  Calcification - mild  Left External Iliac Artery -  Minimal Diameter - 8.5 x 9.0 mm  Tortuosity - moderate  Calcification - mild  Left Common Femoral Artery -  Minimal Diameter - 8.0 x 9.2 mm  Tortuosity - mild  Calcification - mild  Review of the MIP images confirms the above findings.  IMPRESSION: 1. Vascular findings and measurements pertinent to potential TAVR procedure, as above. This patient does have suitable pelvic arterial access, on the left side. 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 2. In addition, there is fusiform aneurysmal dilatation of the infrarenal abdominal aorta which measures up to 5.1 x 4.6 cm. Recommend followup by abdomen and pelvis CTA in 3-6 months, and vascular surgery referral/consultation if not already obtained. This recommendation follows ACR consensus guidelines: White Paper of the ACR Incidental Findings Committee II on Vascular Findings. J Am Coll Radiol 2013; 10:789-794. 3. 3 mm subpleural nodule in the periphery of the right upper lobe is statistically most  likely to represent a subpleural lymph node. If the patient is at high risk for bronchogenic carcinoma, follow-up chest CT at 1 year is recommended. If the patient is at low risk, no follow-up is needed. This recommendation follows the consensus statement: Guidelines for Management of Small Pulmonary Nodules Detected on CT Scans: A Statement from the Fleischner Society as published in Radiology 2005; 237:395-400. 4. Mild colonic diverticulosis without findings to suggest acute diverticulitis at this time.   Electronically Signed  By: Trudie Reed M.D.  On: 05/28/2014 13:00    STS Risk Calculator  Procedure    AVR  Risk of Mortality   4.4% Morbidity or Mortality  24.6% Prolonged LOS   10.9% Short LOS    21.7% Permanent Stroke   2.4% Prolonged Vent Support  13.3% DSW Infection    0.3% Renal Failure    7.7% Reoperation    9.8%    Impression:  Patient has stage D severe symptomatic aortic stenosis. I have personally reviewed the patient's most recent echocardiogram, cardiac catheterization, CT angiograms, and other associated diagnostic tests. Transthoracic echocardiogram confirms the presence of severe calcific aortic stenosis. Aortic valve is tricuspid with severe calcification and thickening and restricted leaflet mobility involving all 3 leaflets of the aortic valve. Peak velocity across  the aortic valve measures approximately 4.5 m/s.  Left ventricular systolic function is preserved. Cardiac catheterization demonstrates the presence of severe multivessel coronary artery disease but continued patency in the pre-existing stents in both the left anterior descending and left circumflex coronary artery territories.  There are no high-grade focal stenoses in any of the major coronary vascular territories.  Cardiac gated CT angiogram of the heart demonstrates anatomical characteristics suitable for transcatheter aortic valve replacement without any particularly complicating features and CT angiogram of the abdomen and pelvis demonstrates adequate pelvic vasculature for a transfemoral approach. The patient does have a moderate-sized infrarenal abdominal aortic aneurysm. Risks associated with conventional surgical aortic valve replacement would be at least moderately elevated because of the patient's advanced age and numerous comorbid medical problems. The patient refuses any possible consideration of open conventional surgery for a variety of personal reasons. Under the circumstances, I agree that transcatheter aortic valve replacement may be a very reasonable alternative.   Plan:  The patient and his wife were counseled at length regarding treatment alternatives for management of severe symptomatic aortic stenosis. Alternative approaches such as conventional aortic valve replacement, transcatheter aortic valve replacement, and palliative medical therapy were compared and contrasted at length.  The risks associated with conventional surgical aortic valve replacement were been discussed in detail, as were expectations for post-operative convalescence. Long-term prognosis with medical therapy was discussed. This discussion was placed in the context of the patient's own specific clinical presentation and past medical history.  All of their questions been addressed.  The patient hopes to proceed with TAVR  next week as previously scheduled.  Following the decision to proceed with transcatheter aortic valve replacement, a discussion has been held regarding what types of management strategies would be attempted intraoperatively in the event of life-threatening complications, including whether or not the patient would be considered a candidate for the use of cardiopulmonary bypass and/or conversion to open sternotomy for attempted surgical intervention.  The patient has been advised of a variety of complications that might develop including but not limited to risks of death, stroke, paravalvular leak, aortic dissection or other major vascular complications, aortic annulus rupture, device embolization, cardiac rupture or perforation, mitral regurgitation, acute myocardial infarction, arrhythmia,  heart block or bradycardia requiring permanent pacemaker placement, congestive heart failure, respiratory failure, renal failure, pneumonia, infection, other late complications related to structural valve deterioration or migration, or other complications that might ultimately cause a temporary or permanent loss of functional independence or other long term morbidity.  The patient provides full informed consent for the procedure as described and all questions were answered.     Salvatore Decent. Cornelius Moras, MD 06/25/2014 4:44 PM

## 2014-06-26 ENCOUNTER — Encounter: Payer: Self-pay | Admitting: *Deleted

## 2014-06-27 ENCOUNTER — Telehealth: Payer: Self-pay | Admitting: *Deleted

## 2014-06-28 ENCOUNTER — Encounter (HOSPITAL_COMMUNITY)
Admission: RE | Admit: 2014-06-28 | Discharge: 2014-06-28 | Disposition: A | Discharge status: Home / Self Care | Payer: Medicare Other | Source: Ambulatory Visit | Attending: Surgery | Admitting: Surgery

## 2014-06-28 ENCOUNTER — Encounter (HOSPITAL_COMMUNITY): Payer: Self-pay

## 2014-06-28 ENCOUNTER — Encounter (HOSPITAL_COMMUNITY)
Admission: RE | Admit: 2014-06-28 | Discharge: 2014-06-28 | Disposition: A | Discharge status: Home / Self Care | Payer: Medicare Other | Source: Ambulatory Visit | Attending: Cardiovascular Disease | Admitting: Cardiovascular Disease

## 2014-06-28 VITALS — BP 119/81 | HR 55 | Temp 97.7°F | Resp 20 | Ht 67.0 in | Wt 196.9 lb

## 2014-06-28 DIAGNOSIS — R001 Bradycardia, unspecified: Secondary | ICD-10-CM | POA: Insufficient documentation

## 2014-06-28 DIAGNOSIS — I440 Atrioventricular block, first degree: Secondary | ICD-10-CM | POA: Diagnosis not present

## 2014-06-28 DIAGNOSIS — Z0183 Encounter for blood typing: Secondary | ICD-10-CM | POA: Insufficient documentation

## 2014-06-28 DIAGNOSIS — Z01818 Encounter for other preprocedural examination: Secondary | ICD-10-CM | POA: Insufficient documentation

## 2014-06-28 DIAGNOSIS — I350 Nonrheumatic aortic (valve) stenosis: Secondary | ICD-10-CM

## 2014-06-28 DIAGNOSIS — Z01812 Encounter for preprocedural laboratory examination: Secondary | ICD-10-CM | POA: Insufficient documentation

## 2014-06-28 HISTORY — DX: Constipation, unspecified: K59.00

## 2014-06-28 LAB — URINALYSIS, ROUTINE W REFLEX MICROSCOPIC
Bilirubin Urine: NEGATIVE
GLUCOSE, UA: NEGATIVE mg/dL
HGB URINE DIPSTICK: NEGATIVE
Ketones, ur: NEGATIVE mg/dL
Nitrite: NEGATIVE
PROTEIN: NEGATIVE mg/dL
Specific Gravity, Urine: 1.016 (ref 1.005–1.030)
Urobilinogen, UA: 0.2 mg/dL (ref 0.0–1.0)
pH: 6 (ref 5.0–8.0)

## 2014-06-28 LAB — BLOOD GAS, ARTERIAL
ACID-BASE EXCESS: 0.2 mmol/L (ref 0.0–2.0)
Bicarbonate: 24 mEq/L (ref 20.0–24.0)
Drawn by: 42180
FIO2: 0.21 %
O2 Saturation: 97.8 %
Patient temperature: 98.6
TCO2: 25.1 mmol/L (ref 0–100)
pCO2 arterial: 37.1 mmHg (ref 35.0–45.0)
pH, Arterial: 7.427 (ref 7.350–7.450)
pO2, Arterial: 98.9 mmHg (ref 80.0–100.0)

## 2014-06-28 LAB — PROTIME-INR
INR: 1.08 (ref 0.00–1.49)
Prothrombin Time: 14.1 seconds (ref 11.6–15.2)

## 2014-06-28 LAB — COMPREHENSIVE METABOLIC PANEL
ALBUMIN: 4 g/dL (ref 3.5–5.0)
ALK PHOS: 58 U/L (ref 38–126)
ALT: 16 U/L — ABNORMAL LOW (ref 17–63)
AST: 22 U/L (ref 15–41)
Anion gap: 11 (ref 5–15)
BUN: 12 mg/dL (ref 6–20)
CHLORIDE: 105 mmol/L (ref 101–111)
CO2: 22 mmol/L (ref 22–32)
Calcium: 8.8 mg/dL — ABNORMAL LOW (ref 8.9–10.3)
Creatinine, Ser: 1.22 mg/dL (ref 0.61–1.24)
GFR calc Af Amer: >60 mL/min (ref >60)
GFR, EST NON AFRICAN AMERICAN: 54 mL/min — AB (ref >60)
GLUCOSE: 164 mg/dL — AB (ref 65–99)
Potassium: 4.3 mmol/L (ref 3.5–5.1)
SODIUM: 138 mmol/L (ref 135–145)
Total Bilirubin: 0.9 mg/dL (ref 0.3–1.2)
Total Protein: 7 g/dL (ref 6.5–8.1)

## 2014-06-28 LAB — CBC
HCT: 40.6 % (ref 39.0–52.0)
Hemoglobin: 13.5 g/dL (ref 13.0–17.0)
MCH: 30.3 pg (ref 26.0–34.0)
MCHC: 33.3 g/dL (ref 30.0–36.0)
MCV: 91 fL (ref 78.0–100.0)
PLATELETS: 164 10*3/uL (ref 150–400)
RBC: 4.46 MIL/uL (ref 4.22–5.81)
RDW: 14.3 % (ref 11.5–15.5)
WBC: 7.8 10*3/uL (ref 4.0–10.5)

## 2014-06-28 LAB — SURGICAL PCR SCREEN
MRSA, PCR: NEGATIVE
Staphylococcus aureus: NEGATIVE

## 2014-06-28 LAB — GLUCOSE, CAPILLARY: Glucose-Capillary: 134 mg/dL — ABNORMAL HIGH (ref 65–99)

## 2014-06-28 LAB — APTT: aPTT: 32 seconds (ref 24–37)

## 2014-06-28 LAB — URINE MICROSCOPIC-ADD ON

## 2014-06-28 NOTE — Progress Notes (Signed)
   06/28/14 1004  OBSTRUCTIVE SLEEP APNEA  Have you ever been diagnosed with sleep apnea through a sleep study? No  Do you snore loudly (loud enough to be heard through closed doors)?  1  Do you often feel tired, fatigued, or sleepy during the daytime? 1  Has anyone observed you stop breathing during your sleep? 0  Do you have, or are you being treated for high blood pressure? 1  BMI more than 35 kg/m2? 0  Age over 79 years old? 1  Neck circumference greater than 40 cm/16 inches? 1  Gender: 1  Obstructive Sleep Apnea Score 6   This patient has screened at risk for sleep apnea using the STOP Bang tool used during a pre-surgical visit. A score of 4 or greater is at risk for sleep apnea.

## 2014-06-28 NOTE — Pre-Procedure Instructions (Signed)
Nadara ModeJames G Janek  06/28/2014     [facility] [address] [place], [place] - [address] [place] [id number] Phone: [phone] Fax: [phone]    Your procedure is scheduled on: Tuesday July 02, 2014 at 7:45 AM.  [facility] at 5:30 A.M.  Call this number if you have problems the morning of surgery: [phone]    Remember:  Do not eat food or drink liquids after midnight.  Take these medicines the morning of surgery with A SIP OF WATER: Acetaminophen (Tylenol) if needed, Amlodipine (Norvasc), Metoprolol (Toprol XL)   Do  NOT take any diabetic medications the morning of your surgery (Ex. Glimepiride/Amaryl or Metformin/Glucophage)   Follow your Physicians orders about stopping Plavix   Please stop taking any vitamins, fish oil, herbal medications   Do not wear jewelry.  Do not wear lotions, powders, or cologne.   Men may shave face and neck.  Do not bring valuables to the hospital.  [facility] is not responsible for any belongings or valuables.  Contacts, dentures or bridgework may not be worn into surgery.  Leave your suitcase in the car.  After surgery it may be brought to your room.  For patients admitted to the hospital, discharge time will be determined by your treatment team.  Patients discharged the day of surgery will not be allowed to drive home.   Name and phone number of your driver:    Special instructions: Shower using CHG soap the night before and the morning of your surgery  Please read over the following fact sheets that you were given. Pain Booklet, Coughing and Deep Breathing, Blood Transfusion Information, MRSA Information and Surgical Site Infection Prevention

## 2014-06-28 NOTE — Progress Notes (Signed)
[facility] is [facility]. Patient denied having any acute cardiac or pulmonary issues.   Nurse inquired about fasting blood glucose and patient informed Nurse that he did not check his sugar this morning, however the highest it has been was in the 300s and the lowest was 127. Upon arrival to PAT blood glucose was 134.  Nurse inquired about Plavix and patient stated that he stopped it on Wednesday June 1st. Wife at chair side during PAT visit.  Nurse explained in detail the medications patient are to take DOS, and even had patients wife mark which medications  patient needed to continue to take and which medications needed to be held before surgery as patient and his wife appeared to be confused. Both verbalized understanding upon completion of discussion.

## 2014-06-29 LAB — HEMOGLOBIN A1C
Hgb A1c MFr Bld: 7.4 % — ABNORMAL HIGH (ref 4.8–5.6)
Mean Plasma Glucose: 166 mg/dL

## 2014-07-01 MED ORDER — POTASSIUM CHLORIDE 2 MEQ/ML IV SOLN
80.0000 meq | INTRAVENOUS | Status: DC
  Filled 2014-07-01: qty 40

## 2014-07-01 MED ORDER — DEXMEDETOMIDINE HCL IN NACL 400 MCG/100ML IV SOLN
0.1000 ug/kg/h | INTRAVENOUS | Status: AC
  Administered 2014-07-02: .3 ug/kg/h via INTRAVENOUS
  Filled 2014-07-01: qty 100

## 2014-07-01 MED ORDER — NITROGLYCERIN IN D5W 200-5 MCG/ML-% IV SOLN
2.0000 ug/min | INTRAVENOUS | Status: AC
  Administered 2014-07-02: 5 ug/min via INTRAVENOUS
  Filled 2014-07-01: qty 250

## 2014-07-01 MED ORDER — VANCOMYCIN HCL 10 G IV SOLR
1500.0000 mg | INTRAVENOUS | Status: AC
  Administered 2014-07-02: 1500 mg via INTRAVENOUS
  Filled 2014-07-01 (×2): qty 1500

## 2014-07-01 MED ORDER — SODIUM CHLORIDE 0.9 % IV SOLN
INTRAVENOUS | Status: AC
  Administered 2014-07-02: 2.3 [IU]/h via INTRAVENOUS
  Filled 2014-07-01: qty 2.5

## 2014-07-01 MED ORDER — DEXTROSE 5 % IV SOLN
1.5000 g | INTRAVENOUS | Status: AC
  Administered 2014-07-02: 1.5 g via INTRAVENOUS
  Filled 2014-07-01 (×2): qty 1.5

## 2014-07-01 MED ORDER — SODIUM CHLORIDE 0.9 % IV SOLN
INTRAVENOUS | Status: DC
  Filled 2014-07-01: qty 30

## 2014-07-01 MED ORDER — MAGNESIUM SULFATE 50 % IJ SOLN
40.0000 meq | INTRAMUSCULAR | Status: DC
  Filled 2014-07-01: qty 10

## 2014-07-01 MED ORDER — DEXTROSE 5 % IV SOLN
30.0000 ug/min | INTRAVENOUS | Status: AC
  Administered 2014-07-02: 10 ug/min via INTRAVENOUS
  Filled 2014-07-01: qty 2

## 2014-07-01 MED ORDER — DOPAMINE-DEXTROSE 3.2-5 MG/ML-% IV SOLN
0.0000 ug/kg/min | INTRAVENOUS | Status: DC
  Filled 2014-07-01: qty 250

## 2014-07-01 MED ORDER — EPINEPHRINE HCL 1 MG/ML IJ SOLN
0.0000 ug/min | INTRAVENOUS | Status: DC
  Filled 2014-07-01: qty 4

## 2014-07-01 MED ORDER — NOREPINEPHRINE BITARTRATE 1 MG/ML IV SOLN
0.0000 ug/min | INTRAVENOUS | Status: DC
  Filled 2014-07-01: qty 4

## 2014-07-01 MED ORDER — METOPROLOL TARTRATE 12.5 MG HALF TABLET
12.5000 mg | ORAL_TABLET | ORAL | Status: AC
  Administered 2014-07-02: 12.5 mg via ORAL
  Filled 2014-07-01: qty 1

## 2014-07-01 NOTE — H&P (Signed)
[address]       [place] [id number]             [phone]      Cardiothoracic Surgery History and Physical:   Referring Provider is Rinaldo Cloud, MD PCP is Rinaldo Cloud, MD  Chief Complaint  Patient presents with  . Aortic Stenosis    severe...    HPI:  The patient is an 81 year old gentleman with a long history of coronary artery disease who had an inferoposterior MI in 2003 based on findings of a nuclear scan. Cath showed moderate diffuse coronary disease and he was treated medically. In 2009 he presented with unstable angina and was found to have severe 3-vessel coronary disease by cath. He was seen by Dr. Maren Beach but the patient refused surgery and was treated with multivessel stenting. He did well with this and his cath in 2014 showed continued patency of the stents in the LAD and LCX. Over the past few years he has been followed for severe aortic stenosis and has refused surgical treatment. An echo in 11/2013 showed severe AS with a peak velocity over 4.5 m/sec. He has had progression of his symptoms over the past few months and has been getting short of breath with walking around in the house and is unable to do any significant activity. He reports worsening fatigue and some chest pain with mild exertion. He underwent repeat cath by Dr. Sharyn Lull on 05/21/2014 showing patent stents and otherwise non-obstructive coronary disease.  Past Medical History  Diagnosis Date  . Diabetes mellitus without complication   . Hypertension   . Coronary artery disease   . Renal disorder     kidney stones  . Hyperlipidemia   . Osteoarthritis   . Severe aortic stenosis 05/06/2014    Past Surgical History  Procedure Laterality Date  . Cardiac surgery      stent placement  . Coronary angiogram  09/12/2012    Procedure: CORONARY ANGIOGRAM; Surgeon: Robynn Pane, MD; Location: [facility] CATH LAB; Service:  Cardiovascular;;  . Left and right heart catheterization with coronary angiogram N/A 05/21/2014    Procedure: LEFT AND RIGHT HEART CATHETERIZATION WITH CORONARY ANGIOGRAM; Surgeon: Rinaldo Cloud, MD; Location: [facility] CATH LAB; Service: Cardiovascular; Laterality: N/A;    Family History  Problem Relation Age of Onset  . Hypertension Father   . Cancer Mother     History   Social History  . Marital Status: Married    Spouse Name: N/A  . Number of Children: N/A  . Years of Education: N/A   Occupational History  . Not on file.   Social History Main Topics  . Smoking status: Never Smoker   . Smokeless tobacco: Not on file  . Alcohol Use: No  . Drug Use: No  . Sexual Activity: Not on file   Other Topics Concern  . Not on file   Social History Narrative  Retired and lives with his wife.  Current Outpatient Prescriptions  Medication Sig Dispense Refill  . acetaminophen (TYLENOL) 500 MG tablet Take 500 mg by mouth every 6 (six) hours as needed for pain.    Marland Kitchen amLODipine (NORVASC) 5 MG tablet Take 5 mg by mouth 2 (two) times daily.    Marland Kitchen aspirin 81 MG tablet Take 81 mg by mouth daily.    . clopidogrel (PLAVIX) 75 MG tablet Take 75 mg by mouth daily.    . fish oil-omega-3 fatty acids 1000 MG capsule  Take 3 g by mouth daily.     Marland Kitchen glimepiride (AMARYL) 2 MG tablet Take 2 mg by mouth daily.     Marland Kitchen lisinopril (PRINIVIL,ZESTRIL) 40 MG tablet Take 40 mg by mouth daily.    . metFORMIN (GLUCOPHAGE) 500 MG tablet Take 500-1,000 mg by mouth 2 (two) times daily with a meal. Takes 2 tablets in am and 1 tablet in pm    . metoprolol succinate (TOPROL-XL) 50 MG 24 hr tablet Take 50 mg by mouth daily. Take with or immediately following a meal.    . OVER THE COUNTER MEDICATION Take 1-2 tablets by mouth at bedtime as needed. For sleep. Equate brand Nighttime Sleep Aid    .  rosuvastatin (CRESTOR) 20 MG tablet Take 20 mg by mouth daily.     No current facility-administered medications for this visit.    No Known Allergies    Review of Systems:  General:normal appetite, decreased energy, no weight gain, no weight loss, no fever Cardiac:has chest pain with exertion, no chest pain at rest, has SOB with mild exertion, no resting SOB, has PND, has orthopnea, no palpitations, no arrhythmia, no atrial fibrillation, has LE edema, no dizzy spells, no syncope Respiratory:has shortness of breath, no home oxygen, no productive cough, no dry cough, no bronchitis, has wheezing, no hemoptysis, no asthma, no pain with inspiration or cough, no sleep apnea, no CPAP at night GI:no difficulty swallowing, no reflux, no frequent heartburn, no hiatal hernia, occasional abdominal pain, no constipation, no diarrhea, no hematochezia, no hematemesis, no melena GU:no dysuria, no frequency, no urinary tract infection, no hematuria, no enlarged prostate, has kidney stones, no kidney disease Vascular:no pain suggestive of claudication, no pain in feet, no leg cramps, no varicose veins, no DVT, no non-healing foot ulcer Neuro:no stroke, no TIA's, no seizures, has headaches, no temporary blindness one eye, no slurred speech, no peripheral neuropathy, no chronic pain, some instability of gait, no memory/cognitive dysfunction Musculoskeletal:has arthritis, no joint swelling, no myalgias, no difficulty walking, normal mobility  Skin:no rash, no itching, no skin infections, no pressure sores or ulcerations Psych:has anxiety, no depression, no  nervousness, no unusual recent stress Eyes:no blurry vision, no floaters, no recent vision changes, wears glasses or contacts ENT:no hearing loss, no loose or painful teeth, wears dentures,  Hematologic:no easy bruising, no abnormal bleeding, no clotting disorder, no frequent epistaxis Endocrine:has diabetes, does check CBG's at home       Physical Exam:  BP 133/70 mmHg  Pulse 74  Resp 16  Ht 5\' 7"  (1.702 m)  Wt 190 lb (86.183 kg)  BMI 29.75 kg/m2  SpO2 96% General:Elderly, well-appearing HEENT:Unremarkable , NCAT, PERLA, EOMI, oropharynx clear Neck:no JVD, no bruits, no adenopathy or thyromegaly Chest:clear to auscultation, symmetrical breath sounds, no wheezes, no rhonchi  CV:RRR, grade III/VI crescendo/decrescendo murmur heard best at RSB, no diastolic murmur Abdomen:soft, non-tender, no masses or organomegaly Extremities:warm, well-perfused, pulses palpable, no LE edema Rectal/GUDeferred Neuro:Grossly non-focal and symmetrical throughout Skin:Clean and dry, no rashes, no breakdown   Diagnostic Tests:  PHYSICIAN: Mohan N. Sharyn Lull, M.D. DATE OF BIRTH: 1931-06-15  DATE OF PROCEDURE: 05/21/2014 DATE OF DISCHARGE: 05/21/2014   OPERATIVE REPORT   PROCEDURES: Right and left cardiac catheterization with selective left and right coronary angiography, insertion of Swan-Ganz catheter via right  groin using Judkins technique.  INDICATION FOR THE PROCEDURE: Mr. Hillhouse is 81 year old male with past medical history significant for coronary artery disease; history of non- Q-wave myocardial infarction in August of 2009; noted to  have multivessel CAD, refused for CABG, subsequently underwent PCI to LAD and diagonal 1 followed by PCI to left circumflex; history of severe aortic stenosis, refused for AVR, treated medically; hypertension; diabetes mellitus; hypercholesteremia; chronic kidney disease, stage II; degenerative joint disease; complains of progressive increasing shortness of breath with minimal exertion associated with feeling weak, tired, and no energy. The patient denies any PND, orthopnea or leg swelling. Denies palpitation, lightheadedness, or syncope. A 2D echo done in the office in November showed worsening aortic valve area of approximately 0.5 cm2 with mean gradient around 40 mmHg. The patient initially refused for any procedures, but now agrees for transcutaneous aortic valve replacement. The patient was seen by the Heart Team and is being evaluated for percutaneous aortic valve replacement. Discussed with the patient regarding right and left cardiac catheterization, possible PCI prior to TAVR, its risks and benefits, i.e., death, MI, stroke, need for emergency CABG, local vascular complications, and consents for the procedure.  DESCRIPTION OF PROCEDURE: After obtaining the informed consent, the patient was brought to the Cath Lab and was placed on fluoroscopy table. Right groin was prepped and draped in usual fashion. Xylocaine 1% was used for local anesthesia in the right groin. With the help of thin wall needle, 5-French arterial and 7-French venous sheaths were placed. Both the sheaths were aspirated and flushed. Next, 7-French Swan-Ganz catheter was advanced under fluoroscopic guidance up to the RA, RV, PA and wedge position. Pressures were  recorded in all those locations. RA pressure was 7, RV pressure was 37/2, mean was 9, PA pressures were 30/9, mean 21, wedge pressure was 18. Next, the Swan-Ganz catheter was pulled out. Sheaths were aspirated and flushed. Next, 5-French left Judkins catheter was advanced over the wire under fluoroscopic guidance up to the ascending aorta. Wire was pulled out. The catheter was aspirated and connected to the Manifold. Catheter was further advanced and engaged into left coronary ostium. Multiple views of the left system were taken. Next, catheter was disengaged and was pulled out over the wire and was replaced with 5-French right Judkins catheter, which was advanced over the wire under fluoroscopic guidance up to the ascending aorta. Wire was pulled out, the catheter was aspirated and connected to the Manifold. Catheter was further advanced and engaged into right coronary ostium. Multiple views of the right system were taken. Next, catheter was disengaged and was pulled out over the wire. Both the sheaths were aspirated and flushed.  FINDINGS: Left main had 20% distal stenosis. LAD has 20-25% ostial and 30-35% proximal edge stent restenosis, stented segment is widely patent. Diagonal 1 has 50-60% ostial stenosis followed by stented segment, which is widely patent. Left circumflex has 10-15% proximal stenosis, stented segment is widely patent. OM1 and OM2 were very, very small. OM3 was large, which was patent. Left circumflex tapers down in AV groove. RCA has 30-35% mid and distal stenosis. PDA is very very small. PLV branch has 75-80% distal stenosis. Distally, the vessel is very small beyond the lesion. The patient tolerated the procedure well. There were no complications. Plan is to maximize antianginal medications. The patient will be referred back to Heart Team for consideration for TAVR.     Eduardo Osier. Sharyn Lull, M.D.   ADDENDUM REPORT: 05/28/2014  16:39  CLINICAL DATA: Aortic stenosis  EXAM: Cardiac TAVR CT  TECHNIQUE: The patient was scanned on a Philips 256 scanner. A 120 kV retrospective scan was triggered in the descending thoracic aorta at 111 HU's. Gantry rotation speed was 270  msecs and collimation was .9 mm. No beta blockade or nitro were given. The 3D data set was reconstructed in 5% intervals of the R-R cycle. Systolic and diastolic phases were analyzed on a dedicated work station using MPR, MIP and VRT modes. The patient received 80 cc of contrast.  FINDINGS: Aortic Valve: Trileaflet and heavily calcified small amount of nodular calcification at the base of the non coronary leaflet  Aorta: Tortuous and unfolded aortic arch. See radiology report regarding infrarenal AAA measuring 5.1 x 4.6 cm  Sinotubular Junction: 26 mm  Ascending Thoracic Aorta: 30 mm  Aortic Arch: 28 mm  Sinus of Valsalva Measurements:  Non-coronary: 30.5 mm  Right -coronary: 29.5 mm  Left -coronary: 30.6 mm  Coronary Artery Height above Annulus:  Left Main: 12.8 mm  Right Coronary: 16 mm  Virtual Basal Annulus Measurements:  Maximum/Minimum Diameter: 27.3 mm x 19.5 mm  Perimeter: 74 mm  Area: 423 mm2  Coronary Arteries: Sufficient height above annulus for delivery  Optimum Fluoroscopic Angle for Delivery: Essentially AP LAO 1 degree Cranial 1 degree  IMPRESSION: 1) Calcified trileaflet aortic valve with annulus of 423 mm2 suitable for a 23 mm Sapien 3 valve  2) Mild nodular calcification of the base of the non coronary cusp  3) Sufficient coronary height above annulus for delivery  4) Optimum deployment angle essentially AP LAO 1 degree Cranial 1 degree  5) Tortuous and unfolded aortic arch. See radiology report regarding 5.1 x 4.6 cm infrarenal aneurysm Both may impact ease of transfemoral delivery  Charlton Haws   Electronically Signed  By: Charlton Haws M.D.   On: 05/28/2014 16:39      Study Result     EXAM: OVER-READ INTERPRETATION CT CHEST  The following report is an over-read performed by radiologist Dr. Royal Piedra [facility] on 05/28/2014. This over-read does not include interpretation of cardiac or coronary anatomy or pathology. The coronary calcium score/coronary CTA interpretation by the cardiologist is attached.  COMPARISON: No priors.  FINDINGS: A contemporaneously obtained CTA of the chest, abdomen and pelvis was performed today.  IMPRESSION: Please see separate dictation for contemporaneously performed CTA of the chest, abdomen and pelvis from today for full description of incidental noncardiac findings.  Electronically Signed: By: Trudie Reed M.D. On: 05/28/2014 12:09      Result History     CT Coronary Morp W/Cta Cor W/Score W/Ca W/Cm &/Or Wo/Cm (Order #[id number]) on 05/28/2014 - Order Result History Report          Vitals     Height Weight BMI (Calculated)    5\' 7"  (1.702 m) 190 lb (86.183 kg) 29.8      Interpretation Summary     EXAM: OVER-READ INTERPRETATION CT CHEST  The following report is an over-read performed by radiologist Dr. Royal Piedra [facility] on 05/28/2014. This over-read does not include interpretation of cardiac or coronary anatomy or pathology. The coronary calcium score/coronary CTA interpretation by the cardiologist is attached.  COMPARISON: No priors.  FINDINGS: A contemporaneously obtained CTA of the chest, abdomen and pelvis was performed today.  IMPRESSION: Please see separate dictation for contemporaneously performed CTA of the chest, abdomen and pelvis from today for full description of incidental noncardiac findings.  Electronically Signed: By: Trudie Reed M.D. On: 05/28/2014 12:09     Addendum by Wendall Stade, MD on Tue May 28, 2014 4:42 PM      ADDENDUM REPORT: 05/28/2014 16:39  CLINICAL DATA: Aortic stenosis  EXAM: Cardiac TAVR CT  TECHNIQUE: The patient was scanned on a Philips 256 scanner. A 120 kV retrospective scan was triggered in the descending thoracic aorta at 111 HU's. Gantry rotation speed was 270 msecs and collimation was .9 mm. No beta blockade or nitro were given. The 3D data set was reconstructed in 5% intervals of the R-R cycle. Systolic and diastolic phases were analyzed on a dedicated work station using MPR, MIP and VRT modes. The patient received 80 cc of contrast.  FINDINGS: Aortic Valve: Trileaflet and heavily calcified small amount of nodular calcification at the base of the non coronary leaflet  Aorta: Tortuous and unfolded aortic arch. See radiology report regarding infrarenal AAA measuring 5.1 x 4.6 cm  Sinotubular Junction: 26 mm  Ascending Thoracic Aorta: 30 mm  Aortic Arch: 28 mm  Sinus of Valsalva Measurements:  Non-coronary: 30.5 mm  Right -coronary: 29.5 mm  Left -coronary: 30.6 mm  Coronary Artery Height above Annulus:  Left Main: 12.8 mm  Right Coronary: 16 mm  Virtual Basal Annulus Measurements:  Maximum/Minimum Diameter: 27.3 mm x 19.5 mm  Perimeter: 74 mm  Area: 423 mm2  Coronary Arteries: Sufficient height above annulus for delivery  Optimum Fluoroscopic Angle for Delivery: Essentially AP LAO 1 degree Cranial 1 degree  IMPRESSION: 1) Calcified trileaflet aortic valve with annulus of 423 mm2 suitable for a 23 mm Sapien 3 valve  2) Mild nodular calcification of the base of the non coronary cusp  3) Sufficient coronary height above annulus for delivery  4) Optimum deployment angle essentially AP LAO 1 degree Cranial 1 degree  5) Tortuous and unfolded aortic arch. See radiology report regarding 5.1 x 4.6 cm infrarenal aneurysm Both may impact ease of transfemoral delivery  Charlton Haws   Electronically  Signed  By: Charlton Haws M.D.  On: 05/28/2014 16:39      CLINICAL DATA: 81 year old male with severe aortic stenosis. Preprocedural study prior to potential transcatheter aortic valve replacement (TAVR).  EXAM: CT ANGIOGRAPHY CHEST, ABDOMEN AND PELVIS  TECHNIQUE: Multidetector CT imaging through the chest, abdomen and pelvis was performed using the standard protocol during bolus administration of intravenous contrast. Multiplanar reconstructed images and MIPs were obtained and reviewed to evaluate the vascular anatomy.  CONTRAST: 70mL OMNIPAQUE IOHEXOL 350 MG/ML SOLN  COMPARISON: CT of the abdomen and pelvis 12/22/2009.  FINDINGS: CTA CHEST FINDINGS  Mediastinum/Lymph Nodes: Heart size is borderline enlarged. There is no significant pericardial fluid, thickening or pericardial calcification. There is atherosclerosis of the thoracic aorta, the great vessels of the mediastinum and the coronary arteries, including calcified atherosclerotic plaque in the left main, left anterior descending, left circumflex and right coronary arteries. Severely calcified aortic valve. Mild calcifications of the mitral valve and annulus. No pathologically enlarged mediastinal or hilar lymph nodes. Esophagus is unremarkable in appearance. No axillary lymphadenopathy.  Lungs/Pleura: 3 mm subpleural nodule in the periphery of the right upper lobe (image 33 of series 403). No acute consolidative airspace disease. No pleural effusions. Mild scarring in the lung bases bilaterally.  Musculoskeletal/Soft Tissues: There are no aggressive appearing lytic or blastic lesions noted in the visualized portions of the skeleton.  CTA ABDOMEN AND PELVIS FINDINGS  Hepatobiliary: No cystic or solid hepatic lesions. No intra or extrahepatic biliary ductal dilatation. High attenuation material lying dependently in the gallbladder may reflect tiny gallstones and/or biliary sludge. No  current findings to suggest an acute cholecystitis at this time.  Pancreas: No pancreatic mass. No pancreatic ductal dilatation. No pancreatic or peripancreatic fluid or inflammatory changes.  Spleen: Unremarkable.  Adrenals/Urinary Tract: Bilateral adrenal glands are normal in appearance. Mild multifocal cortical thinning in the kidneys bilaterally. Several sub cm low-attenuation lesions are noted in the kidneys bilaterally, too small to definitively characterize, but statistically favored to represent tiny cysts.  Stomach/Bowel: The appearance of the stomach is normal. No pathologic dilatation of small bowel or colon. A few scattered colonic diverticulae are noted, particularly in the sigmoid colon, without surrounding inflammatory changes to suggest an acute diverticulitis at this time.  Vascular/Lymphatic: Vascular findings and measurements pertinent to potential TAVR procedure, as detailed below. In addition, there is fusiform aneurysmal dilatation of the infrarenal abdominal aorta, which measures up to 5.1 x 4.6 cm. Two right renal arteries bilaterally.  Reproductive: Prostate gland and seminal vesicles are unremarkable in appearance.  Other: No significant volume of ascites. No pneumoperitoneum.  Musculoskeletal: There are no aggressive appearing lytic or blastic lesions noted in the visualized portions of the skeleton.  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  Right Common Femoral Artery  -  Minimal Diameter - 9.4 x 9.2 mm  Tortuosity - mild  Calcification - mild  LEFT PELVIS:  Left Common Iliac Artery -  Minimal Diameter - 12.1 x 12.7 mm  Tortuosity - mild  Calcification - mild  Left External Iliac Artery -  Minimal Diameter - 8.5 x 9.0 mm  Tortuosity - moderate  Calcification - mild  Left Common Femoral Artery -  Minimal Diameter - 8.0 x 9.2 mm  Tortuosity - mild  Calcification - mild  Review of the MIP images confirms the above findings.  IMPRESSION: 1. Vascular findings and measurements pertinent to potential TAVR procedure, as above. This patient does have suitable pelvic arterial access, on the left side. 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 2. In addition, there is fusiform aneurysmal dilatation of the infrarenal abdominal aorta which measures up to 5.1 x 4.6 cm. Recommend followup by abdomen and pelvis CTA in 3-6 months, and vascular surgery referral/consultation if not already obtained. This recommendation follows ACR consensus guidelines: White Paper of the ACR Incidental Findings Committee II on Vascular Findings. J Am Coll Radiol 2013; 10:789-794. 3. 3 mm subpleural nodule in the periphery of the right upper lobe is statistically most likely to represent a subpleural lymph node. If the patient is at high risk for bronchogenic carcinoma, follow-up chest CT at 1 year is recommended. If the patient is at low risk, no follow-up is needed. This recommendation follows the consensus statement: Guidelines for Management of Small Pulmonary Nodules Detected on CT Scans: A Statement from the Fleischner Society as published in Radiology 2005; 237:395-400. 4. Mild colonic diverticulosis without findings to suggest acute diverticulitis at this time.   Electronically Signed  By: Trudie Reedaniel Entrikin M.D.  On: 05/28/2014 13:00      STS Risk Calculator for Isolated AVR: Risk of Mortality: 4.4%  Morbidity or Mortality: 24.953%  Long Length of Stay: 11.81%  Short Length of Stay: 21.386%  Permanent Stroke: 1.862%  Prolonged Ventilation: 16.127%  DSW Infection: 0.4%  Renal Failure: 8.934%   Impression:  81 year-old gentleman with Stage D, severe symptomatic aortic stenosis. This has been present for a while and he has refused surgical AVR in the past but his symptoms are progressively worsening and he is unable to do much activity at this time. His cardiac cath shows patent stents and otherwise non-obstructive coronary disease. I agree that aortic valve replacement is indicated for this patient. I have reviewed the natural history of aortic stenosis with the patient and his wife who is present today. We have discussed the limitations of medical therapy and the poor prognosis associated with symptomatic aortic stenosis. We have also reviewed potential treatment options, including palliative medical therapy, conventional surgical aortic valve replacement, and transcatheter aortic valve replacement. We discussed treatment options in the context of this patient's specific comorbid medical conditions. The patient is at least at moderate risk of surgical AVR and I think his surgical risk is much higher than that predicted by the STS risk score due to his age and current declining functional status. In addition, he was unwilling to have CABG when he was diagnosed with multivessel CAD and he refuses to go through open cardiac surgery now. However, he is willing to undergo TAVR as a treatment alternative. I think TAVR would be the best treatment option for this patient and would give him the best chance of making a functional recovery at his age in his current condition.  I have personally reviewed his cath, echo, gated cardiac CT, and CTA of the chest, abdomen and pelvis. He is a candidate for TAVR with a 23 mm Edwards S3  valve via a left transfemoral route.   I have discussed what types of management strategies would be attempted intraoperatively in the event of life-threatening complications, including whether or not the patient would be considered a candidate for the use of cardiopulmonary bypass and/or conversion to open sternotomy for attempted surgical intervention. The patient has been advised of a variety of complications that might develop including but not limited to risks of death, stroke, paravalvular leak, aortic dissection or other major vascular complications, aortic annulus rupture, device embolization, cardiac rupture or perforation, mitral regurgitation, acute myocardial infarction, arrhythmia, heart block or bradycardia requiring permanent pacemaker placement, congestive heart failure, respiratory failure, renal failure, pneumonia, infection, other late complications related to structural valve deterioration or migration, or other complications that might ultimately cause a temporary or permanent loss of functional independence or other long term morbidity. The patient provides full informed consent for the procedure as described and all questions were answered.   Plan:  Left transfemoral TAVR on Tuesday 07/02/2014.   Alleen Borne, MD

## 2014-07-02 ENCOUNTER — Inpatient Hospital Stay (HOSPITAL_COMMUNITY): Payer: Medicare Other

## 2014-07-02 ENCOUNTER — Inpatient Hospital Stay (HOSPITAL_COMMUNITY): Payer: Medicare Other | Admitting: Certified Registered"

## 2014-07-02 ENCOUNTER — Encounter (HOSPITAL_COMMUNITY)
Admission: RE | Disposition: A | Discharge status: Home Health | Payer: Medicare Other | Source: Ambulatory Visit | Attending: Cardiovascular Disease

## 2014-07-02 ENCOUNTER — Encounter (HOSPITAL_COMMUNITY): Payer: Self-pay | Admitting: *Deleted

## 2014-07-02 ENCOUNTER — Inpatient Hospital Stay (HOSPITAL_COMMUNITY)
Admission: RE | Admit: 2014-07-02 | Discharge: 2014-07-05 | DRG: 267 | Disposition: A | Discharge status: Home Health | Payer: Medicare Other | Source: Ambulatory Visit | Attending: Cardiovascular Disease | Admitting: Cardiovascular Disease

## 2014-07-02 DIAGNOSIS — Z79899 Other long term (current) drug therapy: Secondary | ICD-10-CM

## 2014-07-02 DIAGNOSIS — I5032 Chronic diastolic (congestive) heart failure: Secondary | ICD-10-CM | POA: Diagnosis present

## 2014-07-02 DIAGNOSIS — I440 Atrioventricular block, first degree: Secondary | ICD-10-CM | POA: Diagnosis present

## 2014-07-02 DIAGNOSIS — I2510 Atherosclerotic heart disease of native coronary artery without angina pectoris: Secondary | ICD-10-CM | POA: Diagnosis present

## 2014-07-02 DIAGNOSIS — R001 Bradycardia, unspecified: Secondary | ICD-10-CM | POA: Diagnosis present

## 2014-07-02 DIAGNOSIS — I252 Old myocardial infarction: Secondary | ICD-10-CM

## 2014-07-02 DIAGNOSIS — Z7902 Long term (current) use of antithrombotics/antiplatelets: Secondary | ICD-10-CM

## 2014-07-02 DIAGNOSIS — I10 Essential (primary) hypertension: Secondary | ICD-10-CM | POA: Diagnosis present

## 2014-07-02 DIAGNOSIS — Z955 Presence of coronary angioplasty implant and graft: Secondary | ICD-10-CM | POA: Diagnosis not present

## 2014-07-02 DIAGNOSIS — Z8249 Family history of ischemic heart disease and other diseases of the circulatory system: Secondary | ICD-10-CM | POA: Diagnosis not present

## 2014-07-02 DIAGNOSIS — Z006 Encounter for examination for normal comparison and control in clinical research program: Secondary | ICD-10-CM

## 2014-07-02 DIAGNOSIS — Z7982 Long term (current) use of aspirin: Secondary | ICD-10-CM | POA: Diagnosis not present

## 2014-07-02 DIAGNOSIS — I350 Nonrheumatic aortic (valve) stenosis: Secondary | ICD-10-CM | POA: Diagnosis present

## 2014-07-02 DIAGNOSIS — E119 Type 2 diabetes mellitus without complications: Secondary | ICD-10-CM | POA: Diagnosis present

## 2014-07-02 DIAGNOSIS — E785 Hyperlipidemia, unspecified: Secondary | ICD-10-CM | POA: Diagnosis present

## 2014-07-02 DIAGNOSIS — I359 Nonrheumatic aortic valve disorder, unspecified: Secondary | ICD-10-CM | POA: Diagnosis not present

## 2014-07-02 DIAGNOSIS — Z953 Presence of xenogenic heart valve: Secondary | ICD-10-CM

## 2014-07-02 HISTORY — PX: TEE WITHOUT CARDIOVERSION: SHX5443

## 2014-07-02 HISTORY — PX: TRANSCATHETER AORTIC VALVE REPLACEMENT, TRANSFEMORAL: SHX6400

## 2014-07-02 HISTORY — DX: Pneumonia, unspecified organism: J18.9

## 2014-07-02 HISTORY — DX: Type 2 diabetes mellitus without complications: E11.9

## 2014-07-02 HISTORY — DX: Major depressive disorder, single episode, unspecified: F32.9

## 2014-07-02 HISTORY — DX: ST elevation (STEMI) myocardial infarction involving other coronary artery of inferior wall: I21.19

## 2014-07-02 HISTORY — DX: Unspecified osteoarthritis, unspecified site: M19.90

## 2014-07-02 HISTORY — DX: Calculus of kidney: N20.0

## 2014-07-02 HISTORY — DX: Depression, unspecified: F32.A

## 2014-07-02 LAB — GLUCOSE, CAPILLARY
Glucose-Capillary: 146 mg/dL — ABNORMAL HIGH (ref 65–99)
Glucose-Capillary: 128 mg/dL — ABNORMAL HIGH (ref 65–99)
Glucose-Capillary: 100 mg/dL — ABNORMAL HIGH (ref 65–99)
Glucose-Capillary: 95 mg/dL (ref 65–99)
Glucose-Capillary: 104 mg/dL — ABNORMAL HIGH (ref 65–99)
Glucose-Capillary: 102 mg/dL — ABNORMAL HIGH (ref 65–99)
Glucose-Capillary: 113 mg/dL — ABNORMAL HIGH (ref 65–99)
Glucose-Capillary: 150 mg/dL — ABNORMAL HIGH (ref 65–99)

## 2014-07-02 LAB — POCT I-STAT, CHEM 8
BUN: 11 mg/dL (ref 6–20)
BUN: 9 mg/dL (ref 6–20)
CALCIUM ION: 1.2 mmol/L (ref 1.13–1.30)
Calcium, Ion: 1.11 mmol/L — ABNORMAL LOW (ref 1.13–1.30)
Chloride: 102 mmol/L (ref 101–111)
Chloride: 103 mmol/L (ref 101–111)
Creatinine, Ser: 0.9 mg/dL (ref 0.61–1.24)
Creatinine, Ser: 1 mg/dL (ref 0.61–1.24)
Glucose, Bld: 173 mg/dL — ABNORMAL HIGH (ref 65–99)
Glucose, Bld: 175 mg/dL — ABNORMAL HIGH (ref 65–99)
HCT: 39 % (ref 39.0–52.0)
HEMATOCRIT: 33 % — AB (ref 39.0–52.0)
Hemoglobin: 13.3 g/dL (ref 13.0–17.0)
Hemoglobin: 11.2 g/dL — ABNORMAL LOW (ref 13.0–17.0)
Potassium: 4.2 mmol/L (ref 3.5–5.1)
Potassium: 4 mmol/L (ref 3.5–5.1)
Sodium: 140 mmol/L (ref 135–145)
Sodium: 139 mmol/L (ref 135–145)
TCO2: 26 mmol/L (ref 0–100)
TCO2: 22 mmol/L (ref 0–100)

## 2014-07-02 LAB — POCT I-STAT 3, ART BLOOD GAS (G3+)
ACID-BASE DEFICIT: 2 mmol/L (ref 0.0–2.0)
BICARBONATE: 24.2 meq/L — AB (ref 20.0–24.0)
O2 Saturation: 97 %
Patient temperature: 35.9
TCO2: 26 mmol/L (ref 0–100)
pCO2 arterial: 43.7 mmHg (ref 35.0–45.0)
pH, Arterial: 7.346 — ABNORMAL LOW (ref 7.350–7.450)
pO2, Arterial: 92 mmHg (ref 80.0–100.0)

## 2014-07-02 LAB — CBC
HCT: 34.2 % — ABNORMAL LOW (ref 39.0–52.0)
HEMOGLOBIN: 11.3 g/dL — AB (ref 13.0–17.0)
MCH: 30.1 pg (ref 26.0–34.0)
MCHC: 33 g/dL (ref 30.0–36.0)
MCV: 91 fL (ref 78.0–100.0)
PLATELETS: 138 10*3/uL — AB (ref 150–400)
RBC: 3.76 MIL/uL — ABNORMAL LOW (ref 4.22–5.81)
RDW: 14.2 % (ref 11.5–15.5)
WBC: 6.2 10*3/uL (ref 4.0–10.5)

## 2014-07-02 LAB — POCT I-STAT 4, (NA,K, GLUC, HGB,HCT)
Glucose, Bld: 146 mg/dL — ABNORMAL HIGH (ref 65–99)
HCT: 33 % — ABNORMAL LOW (ref 39.0–52.0)
Hemoglobin: 11.2 g/dL — ABNORMAL LOW (ref 13.0–17.0)
Potassium: 3.7 mmol/L (ref 3.5–5.1)
Sodium: 139 mmol/L (ref 135–145)

## 2014-07-02 LAB — PROTIME-INR
INR: 1.21 (ref 0.00–1.49)
Prothrombin Time: 15.5 seconds — ABNORMAL HIGH (ref 11.6–15.2)

## 2014-07-02 LAB — PREPARE RBC (CROSSMATCH)

## 2014-07-02 LAB — APTT: aPTT: 35 seconds (ref 24–37)

## 2014-07-02 SURGERY — IMPLANTATION, AORTIC VALVE, TRANSCATHETER, FEMORAL APPROACH
Anesthesia: General | Site: Chest

## 2014-07-02 MED ORDER — ACETAMINOPHEN 160 MG/5ML PO SOLN: 650.0000 mg | Freq: Once | ORAL | Status: AC

## 2014-07-02 MED ORDER — PROPOFOL 10 MG/ML IV BOLUS
INTRAVENOUS | Status: AC
  Filled 2014-07-02: qty 20

## 2014-07-02 MED ORDER — CHLORHEXIDINE GLUCONATE 4 % EX LIQD: 30.0000 mL | CUTANEOUS | Status: DC

## 2014-07-02 MED ORDER — ALBUMIN HUMAN 5 % IV SOLN: 250.0000 mL | INTRAVENOUS | Status: DC | PRN

## 2014-07-02 MED ORDER — FENTANYL CITRATE (PF) 250 MCG/5ML IJ SOLN
INTRAMUSCULAR | Status: AC
  Filled 2014-07-02: qty 5

## 2014-07-02 MED ORDER — ACETAMINOPHEN 500 MG PO TABS
1000.0000 mg | ORAL_TABLET | Freq: Four times a day (QID) | ORAL | Status: DC
  Administered 2014-07-03 (×3): 1000 mg via ORAL
  Filled 2014-07-02 (×6): qty 2

## 2014-07-02 MED ORDER — ROCURONIUM BROMIDE 100 MG/10ML IV SOLN
INTRAVENOUS | Status: DC | PRN
  Administered 2014-07-02: 40 mg via INTRAVENOUS
  Administered 2014-07-02: 10 mg via INTRAVENOUS

## 2014-07-02 MED ORDER — LIDOCAINE HCL (CARDIAC) 20 MG/ML IV SOLN
INTRAVENOUS | Status: AC
  Filled 2014-07-02: qty 5

## 2014-07-02 MED ORDER — TRAMADOL HCL 50 MG PO TABS
50.0000 mg | ORAL_TABLET | ORAL | Status: DC | PRN
Start: 2014-07-02 — End: 2014-07-03

## 2014-07-02 MED ORDER — DEXTROSE 5 % IV SOLN
1.5000 g | Freq: Two times a day (BID) | INTRAVENOUS | Status: DC
  Administered 2014-07-02 – 2014-07-03 (×3): 1.5 g via INTRAVENOUS
  Filled 2014-07-02 (×5): qty 1.5

## 2014-07-02 MED ORDER — EPHEDRINE SULFATE 50 MG/ML IJ SOLN
INTRAMUSCULAR | Status: AC
  Filled 2014-07-02: qty 1

## 2014-07-02 MED ORDER — CHLORHEXIDINE GLUCONATE 4 % EX LIQD: 60.0000 mL | Freq: Once | CUTANEOUS | Status: DC

## 2014-07-02 MED ORDER — SODIUM CHLORIDE 0.9 % IV SOLN: INTRAVENOUS | Status: DC

## 2014-07-02 MED ORDER — MIDAZOLAM HCL 2 MG/2ML IJ SOLN
INTRAMUSCULAR | Status: AC
  Filled 2014-07-02: qty 2

## 2014-07-02 MED ORDER — PANTOPRAZOLE SODIUM 40 MG PO TBEC: 40.0000 mg | DELAYED_RELEASE_TABLET | Freq: Every day | ORAL | Status: DC

## 2014-07-02 MED ORDER — PROPOFOL 10 MG/ML IV BOLUS
INTRAVENOUS | Status: DC | PRN
  Administered 2014-07-02: 70 mg via INTRAVENOUS

## 2014-07-02 MED ORDER — PROTAMINE SULFATE 10 MG/ML IV SOLN
INTRAVENOUS | Status: DC | PRN
  Administered 2014-07-02: 20 mg via INTRAVENOUS
  Administered 2014-07-02 (×2): 10 mg via INTRAVENOUS
  Administered 2014-07-02 (×3): 20 mg via INTRAVENOUS

## 2014-07-02 MED ORDER — MIDAZOLAM HCL 2 MG/2ML IJ SOLN: 2.0000 mg | INTRAMUSCULAR | Status: DC | PRN

## 2014-07-02 MED ORDER — SODIUM CHLORIDE 0.9 % IJ SOLN
3.0000 mL | Freq: Two times a day (BID) | INTRAMUSCULAR | Status: DC
  Administered 2014-07-02 – 2014-07-03 (×2): 3 mL via INTRAVENOUS

## 2014-07-02 MED ORDER — ONDANSETRON HCL 4 MG/2ML IJ SOLN: 4.0000 mg | Freq: Four times a day (QID) | INTRAMUSCULAR | Status: DC | PRN

## 2014-07-02 MED ORDER — SODIUM CHLORIDE 0.9 % IR SOLN
Status: DC | PRN
  Administered 2014-07-02: 500 mL

## 2014-07-02 MED ORDER — HEPARIN SODIUM (PORCINE) 1000 UNIT/ML IJ SOLN
INTRAMUSCULAR | Status: DC | PRN
  Administered 2014-07-02: 10000 [IU] via INTRAVENOUS

## 2014-07-02 MED ORDER — ROCURONIUM BROMIDE 50 MG/5ML IV SOLN
INTRAVENOUS | Status: AC
  Filled 2014-07-02: qty 1

## 2014-07-02 MED ORDER — LACTATED RINGERS IV SOLN
INTRAVENOUS | Status: DC | PRN
  Administered 2014-07-02: 07:00:00 via INTRAVENOUS

## 2014-07-02 MED ORDER — LIDOCAINE HCL (CARDIAC) 20 MG/ML IV SOLN
INTRAVENOUS | Status: DC | PRN
  Administered 2014-07-02: 50 mg via INTRAVENOUS

## 2014-07-02 MED ORDER — ASPIRIN EC 81 MG PO TBEC
81.0000 mg | DELAYED_RELEASE_TABLET | Freq: Every day | ORAL | Status: DC
  Administered 2014-07-03 – 2014-07-05 (×3): 81 mg via ORAL
  Filled 2014-07-02 (×3): qty 1

## 2014-07-02 MED ORDER — MORPHINE SULFATE 2 MG/ML IJ SOLN: 1.0000 mg | INTRAMUSCULAR | Status: AC | PRN

## 2014-07-02 MED ORDER — SODIUM CHLORIDE 0.9 % IV SOLN
250.0000 mL | INTRAVENOUS | Status: DC | PRN
Start: 2014-07-02 — End: 2014-07-03

## 2014-07-02 MED ORDER — LACTATED RINGERS IV SOLN: 500.0000 mL | Freq: Once | INTRAVENOUS | Status: AC | PRN

## 2014-07-02 MED ORDER — SODIUM CHLORIDE 0.9 % IJ SOLN
INTRAMUSCULAR | Status: AC
  Filled 2014-07-02: qty 10

## 2014-07-02 MED ORDER — OXYCODONE HCL 5 MG PO TABS: 5.0000 mg | ORAL_TABLET | ORAL | Status: DC | PRN

## 2014-07-02 MED ORDER — NEOSTIGMINE METHYLSULFATE 10 MG/10ML IV SOLN
INTRAVENOUS | Status: DC | PRN
  Administered 2014-07-02: 4 mg via INTRAVENOUS

## 2014-07-02 MED ORDER — EPHEDRINE SULFATE 50 MG/ML IJ SOLN
INTRAMUSCULAR | Status: DC | PRN
  Administered 2014-07-02 (×2): 10 mg via INTRAVENOUS

## 2014-07-02 MED ORDER — VANCOMYCIN HCL IN DEXTROSE 1-5 GM/200ML-% IV SOLN
1000.0000 mg | Freq: Once | INTRAVENOUS | Status: AC
  Administered 2014-07-02: 1000 mg via INTRAVENOUS
  Filled 2014-07-02: qty 200

## 2014-07-02 MED ORDER — FENTANYL CITRATE (PF) 100 MCG/2ML IJ SOLN
INTRAMUSCULAR | Status: DC | PRN
  Administered 2014-07-02 (×2): 50 ug via INTRAVENOUS
  Administered 2014-07-02 (×2): 25 ug via INTRAVENOUS

## 2014-07-02 MED ORDER — INSULIN REGULAR BOLUS VIA INFUSION
0.0000 [IU] | Freq: Three times a day (TID) | INTRAVENOUS | Status: DC
  Filled 2014-07-02: qty 10

## 2014-07-02 MED ORDER — ROSUVASTATIN CALCIUM 20 MG PO TABS
20.0000 mg | ORAL_TABLET | Freq: Every day | ORAL | Status: DC
  Administered 2014-07-03 – 2014-07-04 (×2): 20 mg via ORAL
  Filled 2014-07-02 (×3): qty 1

## 2014-07-02 MED ORDER — DEXMEDETOMIDINE HCL IN NACL 200 MCG/50ML IV SOLN: 0.1000 ug/kg/h | INTRAVENOUS | Status: DC

## 2014-07-02 MED ORDER — MORPHINE SULFATE 2 MG/ML IJ SOLN: 2.0000 mg | INTRAMUSCULAR | Status: DC | PRN

## 2014-07-02 MED ORDER — ONDANSETRON HCL 4 MG/2ML IJ SOLN
INTRAMUSCULAR | Status: AC
  Filled 2014-07-02: qty 2

## 2014-07-02 MED ORDER — ONDANSETRON HCL 4 MG/2ML IJ SOLN
INTRAMUSCULAR | Status: DC | PRN
  Administered 2014-07-02: 4 mg via INTRAVENOUS

## 2014-07-02 MED ORDER — ARTIFICIAL TEARS OP OINT
TOPICAL_OINTMENT | OPHTHALMIC | Status: AC
  Filled 2014-07-02: qty 3.5

## 2014-07-02 MED ORDER — GLYCOPYRROLATE 0.2 MG/ML IJ SOLN
INTRAMUSCULAR | Status: DC | PRN
  Administered 2014-07-02: 0.6 mg via INTRAVENOUS

## 2014-07-02 MED ORDER — SODIUM CHLORIDE 0.9 % IV SOLN
INTRAVENOUS | Status: DC
  Filled 2014-07-02: qty 2.5

## 2014-07-02 MED ORDER — ACETAMINOPHEN 160 MG/5ML PO SOLN
1000.0000 mg | Freq: Four times a day (QID) | ORAL | Status: DC
  Filled 2014-07-02: qty 40

## 2014-07-02 MED ORDER — HEPARIN SODIUM (PORCINE) 1000 UNIT/ML IJ SOLN
INTRAMUSCULAR | Status: AC
  Filled 2014-07-02: qty 1

## 2014-07-02 MED ORDER — SODIUM CHLORIDE 0.9 % IV SOLN: 1.0000 mL/kg/h | INTRAVENOUS | Status: AC

## 2014-07-02 MED ORDER — POTASSIUM CHLORIDE 10 MEQ/50ML IV SOLN
10.0000 meq | INTRAVENOUS | Status: AC
  Administered 2014-07-02 (×3): 10 meq via INTRAVENOUS

## 2014-07-02 MED ORDER — 0.9 % SODIUM CHLORIDE (POUR BTL) OPTIME
TOPICAL | Status: DC | PRN
  Administered 2014-07-02: 1000 mL

## 2014-07-02 MED ORDER — NITROGLYCERIN IN D5W 200-5 MCG/ML-% IV SOLN: 0.0000 ug/min | INTRAVENOUS | Status: DC

## 2014-07-02 MED ORDER — FAMOTIDINE IN NACL 20-0.9 MG/50ML-% IV SOLN
20.0000 mg | Freq: Two times a day (BID) | INTRAVENOUS | Status: DC
  Administered 2014-07-02: 20 mg via INTRAVENOUS

## 2014-07-02 MED ORDER — CLOPIDOGREL BISULFATE 75 MG PO TABS
75.0000 mg | ORAL_TABLET | Freq: Every day | ORAL | Status: DC
  Administered 2014-07-03 – 2014-07-05 (×3): 75 mg via ORAL
  Filled 2014-07-02 (×3): qty 1

## 2014-07-02 MED ORDER — ACETAMINOPHEN 650 MG RE SUPP
650.0000 mg | Freq: Once | RECTAL | Status: AC
  Administered 2014-07-02: 650 mg via RECTAL

## 2014-07-02 MED ORDER — DEXTROSE 5 % IV SOLN
0.0000 ug/min | INTRAVENOUS | Status: DC
  Filled 2014-07-02: qty 2

## 2014-07-02 MED ORDER — GLYCOPYRROLATE 0.2 MG/ML IJ SOLN
INTRAMUSCULAR | Status: AC
  Filled 2014-07-02: qty 3

## 2014-07-02 MED ORDER — CETYLPYRIDINIUM CHLORIDE 0.05 % MT LIQD
7.0000 mL | Freq: Two times a day (BID) | OROMUCOSAL | Status: DC
  Administered 2014-07-03: 7 mL via OROMUCOSAL

## 2014-07-02 MED ORDER — IODIXANOL 320 MG/ML IV SOLN
INTRAVENOUS | Status: DC | PRN
  Administered 2014-07-02: 40.1 mL via INTRAVENOUS

## 2014-07-02 MED ORDER — PROTAMINE SULFATE 10 MG/ML IV SOLN
INTRAVENOUS | Status: AC
  Filled 2014-07-02: qty 25

## 2014-07-02 MED ORDER — SODIUM CHLORIDE 0.9 % IJ SOLN: 3.0000 mL | INTRAMUSCULAR | Status: DC | PRN

## 2014-07-02 SURGICAL SUPPLY — 90 items
ADH SKN CLS APL DERMABOND .7 (GAUZE/BANDAGES/DRESSINGS) ×2
ATTRACTOMAT 16X20 MAGNETIC DRP (DRAPES) IMPLANT
BAG BANDED W/RUBBER/TAPE 36X54 (MISCELLANEOUS) ×4 IMPLANT
BAG DECANTER FOR FLEXI CONT (MISCELLANEOUS) IMPLANT
BAG EQP BAND 135X91 W/RBR TAPE (MISCELLANEOUS) ×2
BAG SNAP BAND KOVER 36X36 (MISCELLANEOUS) ×8 IMPLANT
BLADE 10 SAFETY STRL DISP (BLADE) ×4 IMPLANT
BLADE STERNUM SYSTEM 6 (BLADE) ×4 IMPLANT
BLADE SURG ROTATE 9660 (MISCELLANEOUS) IMPLANT
CABLE PACING FASLOC BIEGE (MISCELLANEOUS) ×2 IMPLANT
CABLE PACING FASLOC BLUE (MISCELLANEOUS) ×4 IMPLANT
CANISTER SUCTION 2500CC (MISCELLANEOUS) IMPLANT
CANNULA FEM VENOUS REMOTE 22FR (CANNULA) IMPLANT
CANNULA OPTISITE PERFUSION 16F (CANNULA) IMPLANT
CANNULA OPTISITE PERFUSION 18F (CANNULA) IMPLANT
CATH ANGIO 5F BER2 65CM (CATHETERS) ×2 IMPLANT
CATH S G BIP PACING (SET/KITS/TRAYS/PACK) ×4 IMPLANT
CLIP TI MEDIUM 24 (CLIP) ×4 IMPLANT
CLIP TI WIDE RED SMALL 24 (CLIP) ×4 IMPLANT
COVER DOME SNAP 22 D (MISCELLANEOUS) ×4 IMPLANT
COVER MAYO STAND STRL (DRAPES) ×4 IMPLANT
COVER TABLE BACK 60X90 (DRAPES) ×4 IMPLANT
CRADLE DONUT ADULT HEAD (MISCELLANEOUS) ×4 IMPLANT
DERMABOND ADVANCED (GAUZE/BANDAGES/DRESSINGS) ×2
DERMABOND ADVANCED .7 DNX12 (GAUZE/BANDAGES/DRESSINGS) ×2 IMPLANT
DRAPE INCISE IOBAN 66X45 STRL (DRAPES) IMPLANT
DRAPE SLUSH MACHINE 52X66 (DRAPES) ×4 IMPLANT
DRAPE TABLE COVER HEAVY DUTY (DRAPES) ×4 IMPLANT
DRSG TEGADERM 4X4.75 (GAUZE/BANDAGES/DRESSINGS) ×4 IMPLANT
ELECT REM PT RETURN 9FT ADLT (ELECTROSURGICAL) ×8
ELECTRODE REM PT RTRN 9FT ADLT (ELECTROSURGICAL) ×4 IMPLANT
FELT TEFLON 6X6 (MISCELLANEOUS) ×4 IMPLANT
FEMORAL VENOUS CANN RAP (CANNULA) IMPLANT
GAUZE SPONGE 4X4 12PLY STRL (GAUZE/BANDAGES/DRESSINGS) ×4 IMPLANT
GLOVE ECLIPSE 7.5 STRL STRAW (GLOVE) ×4 IMPLANT
GLOVE ECLIPSE 8.0 STRL XLNG CF (GLOVE) ×8 IMPLANT
GLOVE EUDERMIC 7 POWDERFREE (GLOVE) ×4 IMPLANT
GLOVE ORTHO TXT STRL SZ7.5 (GLOVE) ×4 IMPLANT
GOWN STRL REUS W/ TWL LRG LVL3 (GOWN DISPOSABLE) ×6 IMPLANT
GOWN STRL REUS W/ TWL XL LVL3 (GOWN DISPOSABLE) ×12 IMPLANT
GOWN STRL REUS W/TWL LRG LVL3 (GOWN DISPOSABLE) ×12
GOWN STRL REUS W/TWL XL LVL3 (GOWN DISPOSABLE) ×24
GUIDEWIRE SAF TJ AMPL .035X180 (WIRE) ×4 IMPLANT
GUIDEWIRE WHOLEY .035 145 JTIP (WIRE) ×2 IMPLANT
INSERT FOGARTY 61MM (MISCELLANEOUS) ×4 IMPLANT
INSERT FOGARTY SM (MISCELLANEOUS) ×8 IMPLANT
INSERT FOGARTY XLG (MISCELLANEOUS) IMPLANT
KIT BASIN OR (CUSTOM PROCEDURE TRAY) ×4 IMPLANT
KIT DILATOR VASC 18G NDL (KITS) IMPLANT
KIT ROOM TURNOVER OR (KITS) ×4 IMPLANT
KIT SUCTION CATH 14FR (SUCTIONS) ×8 IMPLANT
NDL PERC 18GX7CM (NEEDLE) ×2 IMPLANT
NEEDLE PERC 18GX7CM (NEEDLE) ×4 IMPLANT
NS IRRIG 1000ML POUR BTL (IV SOLUTION) ×12 IMPLANT
PACK AORTA (CUSTOM PROCEDURE TRAY) ×4 IMPLANT
PAD ARMBOARD 7.5X6 YLW CONV (MISCELLANEOUS) ×8 IMPLANT
PAD ELECT DEFIB RADIOL ZOLL (MISCELLANEOUS) ×4 IMPLANT
PATCH TACHOSII LRG 9.5X4.8 (VASCULAR PRODUCTS) IMPLANT
SPONGE GAUZE 4X4 12PLY STER LF (GAUZE/BANDAGES/DRESSINGS) ×2 IMPLANT
SPONGE LAP 4X18 X RAY DECT (DISPOSABLE) ×4 IMPLANT
STOPCOCK MORSE 400PSI 3WAY (MISCELLANEOUS) ×4 IMPLANT
SUT ETHIBOND X763 2 0 SH 1 (SUTURE) ×4 IMPLANT
SUT GORETEX CV 4 TH 22 36 (SUTURE) ×4 IMPLANT
SUT GORETEX CV4 TH-18 (SUTURE) ×12 IMPLANT
SUT GORETEX TH-18 36 INCH (SUTURE) ×8 IMPLANT
SUT MNCRL AB 3-0 PS2 18 (SUTURE) ×4 IMPLANT
SUT PROLENE 3 0 SH1 36 (SUTURE) IMPLANT
SUT PROLENE 4 0 RB 1 (SUTURE) ×4
SUT PROLENE 4-0 RB1 .5 CRCL 36 (SUTURE) ×2 IMPLANT
SUT PROLENE 5 0 C 1 36 (SUTURE) ×8 IMPLANT
SUT PROLENE 6 0 C 1 30 (SUTURE) ×8 IMPLANT
SUT SILK  1 MH (SUTURE) ×2
SUT SILK 1 MH (SUTURE) ×2 IMPLANT
SUT SILK 2 0 SH CR/8 (SUTURE) IMPLANT
SUT VIC AB 2-0 CT1 27 (SUTURE) ×8
SUT VIC AB 2-0 CT1 TAPERPNT 27 (SUTURE) ×2 IMPLANT
SUT VIC AB 2-0 CTX 36 (SUTURE) IMPLANT
SUT VIC AB 3-0 SH 27 (SUTURE) ×4
SUT VIC AB 3-0 SH 27X BRD (SUTURE) IMPLANT
SUT VIC AB 3-0 SH 8-18 (SUTURE) ×8 IMPLANT
SUT VIC AB 3-0 X1 27 (SUTURE) ×2 IMPLANT
SYR 30ML LL (SYRINGE) ×8 IMPLANT
SYR 50ML LL SCALE MARK (SYRINGE) ×4 IMPLANT
SYRINGE 10CC LL (SYRINGE) ×2 IMPLANT
TAPE CLOTH SURG 4X10 WHT LF (GAUZE/BANDAGES/DRESSINGS) ×2 IMPLANT
TOWEL OR 17X26 10 PK STRL BLUE (TOWEL DISPOSABLE) ×8 IMPLANT
TRAY FOLEY IC TEMP SENS 14FR (CATHETERS) ×4 IMPLANT
TUBE SUCT INTRACARD DLP 20F (MISCELLANEOUS) IMPLANT
TUBING HIGH PRESSURE 120CM (CONNECTOR) ×4 IMPLANT
VALVE HEART TRANSCATH SZ3 26MM (Prosthesis & Implant Heart) ×2 IMPLANT

## 2014-07-02 NOTE — Progress Notes (Deleted)
  Echocardiogram 2D Echocardiogram has been performed.  Michael Wolfe, Michael Wolfe 07/02/2014, 9:45 AM

## 2014-07-02 NOTE — Anesthesia Preprocedure Evaluation (Addendum)
Anesthesia Evaluation  Patient identified by MRN, date of birth, ID band Patient awake    Reviewed: Allergy & Precautions, NPO status , Patient's Chart, lab work & pertinent test results, reviewed documented beta blocker date and time   History of Anesthesia Complications Negative for: history of anesthetic complications  Airway Mallampati: I  TM Distance: >3 FB Neck ROM: Limited    Dental  (+) Edentulous Upper, Edentulous Lower, Dental Advisory Given   Pulmonary neg pulmonary ROS,  breath sounds clear to auscultation        Cardiovascular hypertension, Pt. on medications and Pt. on home beta blockers - angina+ CAD, + Past MI and + Cardiac Stents (stent x2) Rhythm:Regular Rate:Normal + Systolic murmurs 1/304/26 ECHO: EF 55-60%, severe AS, AVA 0.5cm2   Neuro/Psych negative neurological ROS     GI/Hepatic negative GI ROS, Neg liver ROS,   Endo/Other  diabetes (glu 146), Oral Hypoglycemic AgentsMorbid obesity  Renal/GU negative Renal ROS     Musculoskeletal   Abdominal (+) + obese,   Peds  Hematology negative hematology ROS (+)   Anesthesia Other Findings   Reproductive/Obstetrics                           Anesthesia Physical Anesthesia Plan  ASA: IV  Anesthesia Plan: General   Post-op Pain Management:    Induction: Intravenous  Airway Management Planned: Oral ETT  Additional Equipment: Arterial line, CVP, PA Cath, 3D TEE and Ultrasound Guidance Line Placement  Intra-op Plan:   Post-operative Plan: Possible Post-op intubation/ventilation  Informed Consent: I have reviewed the patients History and Physical, chart, labs and discussed the procedure including the risks, benefits and alternatives for the proposed anesthesia with the patient or authorized representative who has indicated his/her understanding and acceptance.     Plan Discussed with: CRNA and Surgeon  Anesthesia Plan  Comments: (Plan routine monitors, A line, PA cath, GETA with TEE and probable extubation)        Anesthesia Quick Evaluation

## 2014-07-02 NOTE — Progress Notes (Signed)
Pt stated he checked his blood sugar at home around 2 AM and it was in the 200's.  He stated since his CBG was HIGH, he needed to take some apple juice.  Pt consumed 1/2 cup apple juice at 2 AM.  Educated pt about how that was not the correct protocol for high blood sugars.  Dr. Ivin Bootyrews made aware.    Pt needs continued diabetic education.

## 2014-07-02 NOTE — Interval H&P Note (Signed)
History and Physical Interval Note:  07/02/2014 7:30 AM  Michael Wolfe  has presented today for surgery, with the diagnosis of SEVERE AORTIC STENOSIS  The various methods of treatment have been discussed with the patient and family. After consideration of risks, benefits and other options for treatment, the patient has consented to  Procedure(s) with comments: TRANSCATHETER AORTIC VALVE REPLACEMENT, TRANSFEMORAL (N/A) - TAVR-TF APPROACH (23MM VALVE) TRANSESOPHAGEAL ECHOCARDIOGRAM (TEE) (N/A) as a surgical intervention .  The patient's history has been reviewed, patient examined, no change in status, stable for surgery.  I have reviewed the patient's chart and labs.  Questions were answered to the patient's satisfaction.     Alleen BorneBryan K Kennen Stammer

## 2014-07-02 NOTE — Transfer of Care (Signed)
Immediate Anesthesia Transfer of Care Note  Patient: Michael Wolfe  Procedure(s) Performed: Procedure(s) with comments: TRANSCATHETER AORTIC VALVE REPLACEMENT, TRANSFEMORAL (N/A) - TAVR-TF APPROACH (23MM VALVE) TRANSESOPHAGEAL ECHOCARDIOGRAM (TEE) (N/A)  Patient Location: ICU  Anesthesia Type:General  Level of Consciousness: awake  Airway & Oxygen Therapy: Patient Spontanous Breathing and Patient connected to face mask oxygen  Post-op Assessment: Report given to RN  Post vital signs: Reviewed and stable  Last Vitals:  Filed Vitals:   07/02/14 0729  BP:   Pulse:   Temp:   Resp: 20    Complications: No apparent anesthesia complications

## 2014-07-02 NOTE — Op Note (Signed)
CARDIOTHORACIC SURGERY OPERATIVE NOTE  Date of Procedure:  07/02/2014  Preoperative Diagnosis: Severe Aortic Stenosis   Postoperative Diagnosis: Same   Procedure:    Transcatheter Aortic Valve Replacement - Left Transfemoral Approach  Edwards Sapien 3 Transcatheter Heart Valve (size 26 mm, model # 9600TFX, serial # [id number])   Co-Surgeons:  Alleen BorneBryan K. Basia Mcginty, MD and Tonny BollmanMichael Cooper, MD  Assistants:   Salvatore Decentlarence H. Cornelius Moraswen, MD and Verne Carrowhristopher McAlhany, MD  Anesthesiologist:  Jairo Benarswell Jackson, MD  Echocardiographer:  Tobias AlexanderKatarina Nelson, MD  Pre-operative Echo Findings:   severe aortic stenosis  normal left ventricular systolic function  Mild mitral regurgitation  Post-operative Echo Findings:  no paravalvular leak  normal left ventricular systolic function  Unchanged mild MR     DETAILS OF THE OPERATIVE PROCEDURE  The majority of the procedure is documented separately in a procedure note by Dr. Excell Seltzerooper.   TRANSFEMORAL ACCESS:   A small incision is made in the left groin immediately over the common femoral artery. The subcutaneous tissues are divided with electrocautery and the anterior surface of the common femoral artery is identified. Sharp dissection is utilized to free up the artery proximally and distally and the vessel is encircled with a vessel loop.  A pair of CV-4 Gore-tex sutures are place as diamond-shaped purse-strings on the anterior surface of the femoral artery.  The patient is heparinized systemically and ACT verified > 250 seconds.  The common femoral artery is punctured using an  18 gauge needle and a soft J-tipped guidewire is passed into the common iliac artery under fluoroscopic guidance.  A 6 Fr straight diagnostic catheter is placed over the guidewire and the guidewire is removed.  An Amplatz super stiff guidewire is passed through the sheath into the descending thoracic aorta and the introducing diagnostic catheter is removed.  Serial dilators are passed over  the guidewire under continuous fluoroscopic guidance, making certain that each dilator passes easily all of the way into the distal abdominal aorta.  A 14 Fr Truitt MerleEdwards E-sheath is passed over the guidewire into the abdominal aorta.  The introducing dilator is removed, the sheath is flushed with heparinized saline, and the sheath is secured to the skin.    FEMORAL SHEATH REMOVAL AND ARTERIAL CLOSURE:  After the completion of successful valve deployment as documented separately by Dr. Excell Seltzerooper, the femoral artery sheath is removed and the arteriotomy is closed using the previously placed Gore-tex purse-string sutures. Once the repair has been completed protamine was administered to reverse the anticoagulation. A digitally-subtracted arteriogram is obtained from just above the aortic bifurcation to below the arteriotomy to confirm the integrity of the vascular repair.  The incision is irrigated with saline solution and subsequently closed in multiple layers using absorbable suture.  The skin incision is closed using a subcuticular skin closure.     Alleen BorneBryan K. Zaccheaus Storlie MD 07/02/2014 1:54 PM

## 2014-07-02 NOTE — Progress Notes (Signed)
  Echocardiogram Echocardiogram Transesophageal has been performed.  Arvil ChacoFoster, Sobia Karger 07/02/2014, 9:50 AM

## 2014-07-02 NOTE — Anesthesia Procedure Notes (Addendum)
Anesthesia Procedure Note PA catheter:  Routine monitors. Timeout, sterile prep, drape, FBP R neck.  Trendelenburg position.  1% Lido local, finder and trocar RIJ 1st pass with US guidance.  Cordis placed over J wire. PA catheter in easily.  Sterile dressing applied.  Patient tolerated well, VSS.  Jenita Seashore, MD 06:55-07:08 Procedure Name: Intubation Date/Time: 07/02/2014 8:02 AM Performed by: Barrington Ellison Pre-anesthesia Checklist: Patient identified, Emergency Drugs available, Suction available, Patient being monitored and Timeout performed Patient Re-evaluated:Patient Re-evaluated prior to inductionOxygen Delivery Method: Circle system utilized Preoxygenation: Pre-oxygenation with 100% oxygen Intubation Type: IV induction Ventilation: Mask ventilation without difficulty and Oral airway inserted - appropriate to patient size Laryngoscope Size: Mac and 3 Grade View: Grade I Tube type: Oral Tube size: 8.0 mm Number of attempts: 1 Airway Equipment and Method: Stylet Placement Confirmation: ETT inserted through vocal cords under direct vision,  positive ETCO2 and breath sounds checked- equal and bilateral Secured at: 22 cm Tube secured with: Tape Dental Injury: Teeth and Oropharynx as per pre-operative assessment

## 2014-07-02 NOTE — Op Note (Signed)
[facility]  TAVR OPERATIVE NOTE   Date of Procedure:  07/02/2014  Preoperative Diagnosis: Severe Aortic Stenosis   Postoperative Diagnosis: Same   Procedure:    Transcatheter Aortic Valve Replacement - Transfemoral Approach  Edwards Sapien 3 THV (size 26 mm, model # 9600TFX, serial # [id number])   Co-Surgeons:  Evelene Croon, MD and Tonny Bollman, MD  Assistants:   Tressie Stalker, MD and Verne Carrow, MD  Anesthesiologist:  Jairo Ben, MD  Echocardiographer:  Tobias Alexander, MD  Pre-operative Echo Findings:  Severe aortic stenosis  Normal left ventricular systolic function  Mild MR  Post-operative Echo Findings:  No paravalvular leak  Normal left ventricular systolic function  Mild MR (unchanged)  BRIEF CLINICAL NOTE AND INDICATIONS FOR SURGERY 81 year old male with long-standing history of coronary artery disease, aortic stenosis, hypertension, infrarenal AAA, type 2 diabetes mellitus, and hyperlipidemia presenting for TAVR. The patient's cardiac history dates back to 2003 at which time he suffered an acute inferior wall myocardial infarction. Cardiac catheterization at that time revealed moderate diffuse coronary artery disease. He was treated medically without intervention. In 2009 the patient presented with unstable angina. Cardiac catheterization performed at that time demonstrated three-vessel coronary artery disease. The patient was referred for possible surgical revascularization and evaluated by Dr. Donata Clay. The patient declined surgery and was subsequently treated using multivessel PCI and stenting of both the left anterior descending coronary artery and the left circumflex coronary artery. Over the past 2 years the patient has developed aortic stenosis that has progressed on serial follow-up echocardiograms. Over the last 6 months the patient has developed symptoms of exertional shortness of breath and chest discomfort. Most recent  echocardiogram performed 05/10/2014 reveals severe aortic stenosis with peak velocity across the aortic valve measured approximately 4.5 m/s corresponding to peak to peak and mean transvalvular gradient estimated 80 and 47 mmHg, respectively. Left ventricular systolic function remains preserved with ejection fraction estimated 55-60%. Follow-up cardiac catheterization performed by Dr. Sharyn Lull 05/21/2014 demonstrates three-vessel coronary artery disease with continued patency in the stents in both the left anterior descending and left circumflex coronary artery territories. There was otherwise moderate diffuse nonobstructive coronary artery disease. He now has developed marked limitation from exertional dyspnea with NYHA functional Class III symptoms.  During the course of the patient's preoperative work up they have been evaluated comprehensively by a multidisciplinary team of specialists coordinated through the Multid[facility] in the [facility].  They have been demonstrated to suffer from symptomatic severe aortic stenosis as noted above. The patient has been counseled extensively as to the relative risks and benefits of all options for the treatment of severe aortic stenosis including long term medical therapy, conventional surgery for aortic valve replacement, and transcatheter aortic valve replacement.  The patient has been independently evaluated by two cardiac surgeons including Dr Cornelius Moras and Dr. Laneta Simmers, and they are felt to be at high risk for conventional surgical aortic valve replacement based upon a predicted risk of mortality using the Society of Thoracic Surgeons risk calculator of 4.4%. Both surgeons indicated the patient would be a poor candidate for conventional surgery (predicted risk of mortality >15% and/or predicted risk of permanent morbidity >50%) because of comorbidities including poor functional status, limited mobility, refusal of conventional  surgery.   Based upon review of all of the patient's preoperative diagnostic tests they are felt to be candidate for transcatheter aortic valve replacement using the transfemoral approach as an alternative to high risk conventional  surgery.    Following the decision to proceed with transcatheter aortic valve replacement, a discussion has been held regarding what types of management strategies would be attempted intraoperatively in the event of life-threatening complications, including whether or not the patient would be considered a candidate for the use of cardiopulmonary bypass and/or conversion to open sternotomy for attempted surgical intervention.  The patient has been advised of a variety of complications that might develop peculiar to this approach including but not limited to risks of death, stroke, paravalvular leak, aortic dissection or other major vascular complications, aortic annulus rupture, device embolization, cardiac rupture or perforation, acute myocardial infarction, arrhythmia, heart block or bradycardia requiring permanent pacemaker placement, congestive heart failure, respiratory failure, renal failure, pneumonia, infection, other late complications related to structural valve deterioration or migration, or other complications that might ultimately cause a temporary or permanent loss of functional independence or other long term morbidity.  The patient provides full informed consent for the procedure as described and all questions were answered preoperatively.    DETAILS OF THE OPERATIVE PROCEDURE  PREPARATION:   The patient is brought to the operating room on the above mentioned date and central monitoring was established by the anesthesia team including placement of Swan-Ganz catheter and radial arterial line. The patient is placed in the supine position on the operating table.  Intravenous antibiotics are administered. General endotracheal anesthesia is induced uneventfully. A Foley  catheter is placed.  Baseline transesophageal echocardiogram was performed. The patient's chest, abdomen, both groins, and both lower extremities are prepared and draped in a sterile manner. A time out procedure is performed.   PERIPHERAL ACCESS:   Using the modified Seldinger technique, femoral arterial and venous access was obtained with placement of 6 Fr sheaths on the right side.  A pigtail diagnostic catheter was passed through the right femoral arterial sheath under fluoroscopic guidance into the aortic root.  A temporary transvenous pacemaker catheter was passed through the right femoral venous sheath under fluoroscopic guidance into the right ventricle.  The pacemaker was tested to ensure stable lead placement and pacemaker capture. Aortic root angiography was performed in order to determine the optimal angiographic angle for valve deployment.   TRANSFEMORAL ACCESS:  A left femoral arterial cutdown was performed by Dr Laneta SimmersBartle. Please see his separate operative note for details. The patient was heparinized systemically and ACT verified > 250 seconds.    A 14 Fr transfemoral E-sheath was introduced into the left femoral artery after progressively dilating over an Amplatz superstiff wire. An AL-1 catheter was used to direct a straight-tip exchange length wire across the native aortic valve into the left ventricle. This was exchanged out for a pigtail catheter and position was confirmed in the LV apex. Simultaneous LV and Ao pressures were recorded.  The pigtail catheter was then exchanged for an Amplatz Extra-stiff wire in the LV apex. At that point, BAV was performed using a 23 mm valvuloplasty balloon.  Once optimal position was achieved, BAV was done under rapid ventricular pacing at 180 bpm. The patient recovered well hemodynamically.   TRANSCATHETER HEART VALVE DEPLOYMENT:  An Edwards Sapien 3 THV (size 26 mm) was prepared and crimped per manufacturer's guidelines, and the proper orientation  of the valve is confirmed on the Cov[employer] Health CareEdwards Commander delivery system. The valve was advanced through the introducer sheath using normal technique until in an appropriate position in the abdominal aorta beyond the sheath tip. The balloon was then retracted and using the fine-tuning wheel was centered on  the valve. The valve was then advanced across the aortic arch using appropriate flexion of the catheter. The valve was carefully positioned across the aortic valve annulus. The Commander catheter was retracted using normal technique. Once final position of the valve has been confirmed by angiographic assessment, the valve is deployed while temporarily holding ventilation and during rapid ventricular pacing to maintain systolic blood pressure < 50 mmHg and pulse pressure < 10 mmHg. The balloon inflation is held for >3 seconds after reaching full deployment volume. Once the balloon has fully deflated the balloon is retracted into the ascending aorta and valve function is assessed using TEE. There is felt to be no paravalvular leak and no central aortic insufficiency.  The patient's hemodynamic recovery following valve deployment is good.  The deployment balloon and guidewire are both removed. Echo demostrated acceptable post-procedural gradients, stable mitral valve function, and no AI.   PROCEDURE COMPLETION:  The sheath was then removed and arteriotomy repaired by Dr Laneta Simmers. Please see his separate report for details.  Protamine was administered once femoral arterial repair was complete. The temporary pacemaker, pigtail catheters and femoral sheaths were removed with manual pressure used for hemostasis.   The patient tolerated the procedure well and is transported to the surgical intensive care in stable condition. There were no immediate intraoperative complications. All sponge instrument and needle counts are verified correct at completion of the operation.   No blood products were administered during the  operation.  The patient received a total of 40 mL of intravenous contrast during the procedure.  Tonny Bollman MD 07/02/2014 9:38 AM

## 2014-07-02 NOTE — Anesthesia Postprocedure Evaluation (Signed)
  Anesthesia Post-op Note  Patient: Michael Wolfe  Procedure(s) Performed: Procedure(s) with comments: TRANSCATHETER AORTIC VALVE REPLACEMENT, TRANSFEMORAL (N/A) - TAVR-TF APPROACH (23MM VALVE) TRANSESOPHAGEAL ECHOCARDIOGRAM (TEE) (N/A)  Patient Location: SICU  Anesthesia Type:General  Level of Consciousness: awake, alert , oriented and patient cooperative  Airway and Oxygen Therapy: Patient Spontanous Breathing and Patient connected to nasal cannula oxygen  Post-op Pain: none  Post-op Assessment: Post-op Vital signs reviewed, Patient's Cardiovascular Status Stable, Respiratory Function Stable, Patent Airway, No signs of Nausea or vomiting and Pain level controlled  Post-op Vital Signs: Reviewed and stable  Last Vitals:  Filed Vitals:   07/02/14 1345  BP: 132/64  Pulse: 54  Temp: 36.1 C  Resp: 16    Complications: No apparent anesthesia complications

## 2014-07-03 ENCOUNTER — Inpatient Hospital Stay (HOSPITAL_COMMUNITY): Payer: Medicare Other

## 2014-07-03 ENCOUNTER — Ambulatory Visit (HOSPITAL_COMMUNITY): Payer: Medicare Other

## 2014-07-03 ENCOUNTER — Encounter (HOSPITAL_COMMUNITY): Payer: Self-pay | Admitting: Cardiovascular Disease

## 2014-07-03 DIAGNOSIS — I350 Nonrheumatic aortic (valve) stenosis: Secondary | ICD-10-CM

## 2014-07-03 DIAGNOSIS — I359 Nonrheumatic aortic valve disorder, unspecified: Secondary | ICD-10-CM

## 2014-07-03 LAB — CBC
HCT: 35.1 % — ABNORMAL LOW (ref 39.0–52.0)
Hemoglobin: 11.5 g/dL — ABNORMAL LOW (ref 13.0–17.0)
MCH: 30.2 pg (ref 26.0–34.0)
MCHC: 32.8 g/dL (ref 30.0–36.0)
MCV: 92.1 fL (ref 78.0–100.0)
Platelets: 121 10*3/uL — ABNORMAL LOW (ref 150–400)
RBC: 3.81 MIL/uL — ABNORMAL LOW (ref 4.22–5.81)
RDW: 14.5 % (ref 11.5–15.5)
WBC: 7.1 10*3/uL (ref 4.0–10.5)

## 2014-07-03 LAB — BASIC METABOLIC PANEL
Anion gap: 8 (ref 5–15)
BUN: 12 mg/dL (ref 6–20)
CALCIUM: 8 mg/dL — AB (ref 8.9–10.3)
CO2: 28 mmol/L (ref 22–32)
CREATININE: 1.23 mg/dL (ref 0.61–1.24)
Chloride: 100 mmol/L — ABNORMAL LOW (ref 101–111)
GFR calc non Af Amer: 54 mL/min — ABNORMAL LOW (ref >60)
Glucose, Bld: 123 mg/dL — ABNORMAL HIGH (ref 65–99)
POTASSIUM: 4 mmol/L (ref 3.5–5.1)
Sodium: 136 mmol/L (ref 135–145)

## 2014-07-03 LAB — GLUCOSE, CAPILLARY
GLUCOSE-CAPILLARY: 104 mg/dL — AB (ref 65–99)
GLUCOSE-CAPILLARY: 111 mg/dL — AB (ref 65–99)
GLUCOSE-CAPILLARY: 112 mg/dL — AB (ref 65–99)
GLUCOSE-CAPILLARY: 116 mg/dL — AB (ref 65–99)
GLUCOSE-CAPILLARY: 117 mg/dL — AB (ref 65–99)
GLUCOSE-CAPILLARY: 124 mg/dL — AB (ref 65–99)
GLUCOSE-CAPILLARY: 140 mg/dL — AB (ref 65–99)
GLUCOSE-CAPILLARY: 142 mg/dL — AB (ref 65–99)
GLUCOSE-CAPILLARY: 143 mg/dL — AB (ref 65–99)
GLUCOSE-CAPILLARY: 158 mg/dL — AB (ref 65–99)
GLUCOSE-CAPILLARY: 191 mg/dL — AB (ref 65–99)
GLUCOSE-CAPILLARY: 97 mg/dL (ref 65–99)
Glucose-Capillary: 102 mg/dL — ABNORMAL HIGH (ref 65–99)
Glucose-Capillary: 103 mg/dL — ABNORMAL HIGH (ref 65–99)
Glucose-Capillary: 118 mg/dL — ABNORMAL HIGH (ref 65–99)
Glucose-Capillary: 123 mg/dL — ABNORMAL HIGH (ref 65–99)
Glucose-Capillary: 125 mg/dL — ABNORMAL HIGH (ref 65–99)
Glucose-Capillary: 194 mg/dL — ABNORMAL HIGH (ref 65–99)
Glucose-Capillary: 93 mg/dL (ref 65–99)

## 2014-07-03 LAB — MAGNESIUM: MAGNESIUM: 1.6 mg/dL — AB (ref 1.7–2.4)

## 2014-07-03 MED ORDER — FAMOTIDINE 20 MG PO TABS
20.0000 mg | ORAL_TABLET | Freq: Two times a day (BID) | ORAL | Status: DC
  Administered 2014-07-03 – 2014-07-05 (×4): 20 mg via ORAL
  Filled 2014-07-03 (×6): qty 1

## 2014-07-03 MED ORDER — DOCUSATE SODIUM 100 MG PO CAPS
200.0000 mg | ORAL_CAPSULE | Freq: Every day | ORAL | Status: DC
  Administered 2014-07-03 – 2014-07-05 (×3): 200 mg via ORAL
  Filled 2014-07-03 (×3): qty 2

## 2014-07-03 MED ORDER — SODIUM CHLORIDE 0.9 % IJ SOLN
3.0000 mL | Freq: Two times a day (BID) | INTRAMUSCULAR | Status: DC
  Administered 2014-07-03 – 2014-07-05 (×4): 3 mL via INTRAVENOUS

## 2014-07-03 MED ORDER — INSULIN ASPART 100 UNIT/ML ~~LOC~~ SOLN
0.0000 [IU] | SUBCUTANEOUS | Status: DC
  Administered 2014-07-03: 3 [IU] via SUBCUTANEOUS

## 2014-07-03 MED ORDER — ONDANSETRON HCL 4 MG PO TABS: 4.0000 mg | ORAL_TABLET | Freq: Four times a day (QID) | ORAL | Status: DC | PRN

## 2014-07-03 MED ORDER — TRAMADOL HCL 50 MG PO TABS: 50.0000 mg | ORAL_TABLET | ORAL | Status: DC | PRN

## 2014-07-03 MED ORDER — INSULIN ASPART 100 UNIT/ML ~~LOC~~ SOLN
0.0000 [IU] | Freq: Three times a day (TID) | SUBCUTANEOUS | Status: DC
  Administered 2014-07-03 – 2014-07-04 (×3): 2 [IU] via SUBCUTANEOUS

## 2014-07-03 MED ORDER — GLIMEPIRIDE 2 MG PO TABS
2.0000 mg | ORAL_TABLET | Freq: Every day | ORAL | Status: DC
  Administered 2014-07-03 – 2014-07-05 (×3): 2 mg via ORAL
  Filled 2014-07-03 (×4): qty 1

## 2014-07-03 MED ORDER — ONDANSETRON HCL 4 MG/2ML IJ SOLN: 4.0000 mg | Freq: Four times a day (QID) | INTRAMUSCULAR | Status: DC | PRN

## 2014-07-03 MED ORDER — SODIUM CHLORIDE 0.9 % IV SOLN
250.0000 mL | INTRAVENOUS | Status: DC | PRN
Start: 2014-07-03 — End: 2014-07-05

## 2014-07-03 MED ORDER — MOVING RIGHT ALONG BOOK
Freq: Once | Status: AC
  Administered 2014-07-03: 16:00:00
  Filled 2014-07-03: qty 1

## 2014-07-03 MED ORDER — AMLODIPINE BESYLATE 5 MG PO TABS
5.0000 mg | ORAL_TABLET | Freq: Two times a day (BID) | ORAL | Status: DC
  Administered 2014-07-03 – 2014-07-05 (×4): 5 mg via ORAL
  Filled 2014-07-03 (×6): qty 1

## 2014-07-03 MED ORDER — SODIUM CHLORIDE 0.9 % IJ SOLN: 3.0000 mL | INTRAMUSCULAR | Status: DC | PRN

## 2014-07-03 MED ORDER — ACETAMINOPHEN 325 MG PO TABS
650.0000 mg | ORAL_TABLET | Freq: Four times a day (QID) | ORAL | Status: DC | PRN
  Administered 2014-07-04: 650 mg via ORAL
  Filled 2014-07-03: qty 2

## 2014-07-03 MED FILL — Heparin Sodium (Porcine) Inj 1000 Unit/ML: INTRAMUSCULAR | Qty: 30 | Status: AC

## 2014-07-03 MED FILL — Magnesium Sulfate Inj 50%: INTRAMUSCULAR | Qty: 10 | Status: AC

## 2014-07-03 MED FILL — Potassium Chloride Inj 2 mEq/ML: INTRAVENOUS | Qty: 40 | Status: AC

## 2014-07-03 NOTE — Progress Notes (Signed)
1 Day Post-Op Procedure(s) (LRB): TRANSCATHETER AORTIC VALVE REPLACEMENT, TRANSFEMORAL (N/A) TRANSESOPHAGEAL ECHOCARDIOGRAM (TEE) (N/A) Subjective:  No complaints  Objective: Vital signs in last 24 hours: Temp:  [96.4 F (35.8 C)-98.3 F (36.8 C)] 98.2 F (36.8 C) (06/08 0823) Pulse Rate:  [50-141] 57 (06/08 0700) Cardiac Rhythm:  [-] Sinus bradycardia (06/08 0400) Resp:  [12-31] 15 (06/08 0700) BP: (99-141)/(34-71) 125/63 mmHg (06/08 0700) SpO2:  [91 %-100 %] 100 % (06/08 0700) Arterial Line BP: (122-175)/(65-143) 133/76 mmHg (06/07 1400) Weight:  [89.812 kg (198 lb)] 89.812 kg (198 lb) (06/08 0500)  Hemodynamic parameters for last 24 hours: PAP: (40-48)/(19-26) 46/23 mmHg CO:  [4.9 L/min] 4.9 L/min CI:  [2.5 L/min/m2] 2.5 L/min/m2  Intake/Output from previous day: 06/07 0701 - 06/08 0700 In: 2792.7 [I.V.:2542.7; IV Piggyback:250] Out: 1490 [Urine:1440; Blood:50] Intake/Output this shift:    General appearance: alert and cooperative Neurologic: intact Heart: regular rate and rhythm, S1, S2 normal, no murmur, click, rub or gallop Lungs: clear to auscultation bilaterally Extremities: extremities normal, atraumatic, no cyanosis or edema Wound: left groin incision ok. right groin puncture sites ok.  Lab Results:  Recent Labs  07/02/14 1100 07/03/14 0430  WBC 6.2 7.1  HGB 11.3* 11.5*  HCT 34.2* 35.1*  PLT 138* 121*   BMET:  Recent Labs  07/02/14 0854 07/02/14 1049 07/03/14 0430  NA 139 139 136  K 4.0 3.7 4.0  CL 103  --  100*  CO2  --   --  28  GLUCOSE 175* 146* 123*  BUN 9  --  12  CREATININE 1.00  --  1.23  CALCIUM  --   --  8.0*    PT/INR:  Recent Labs  07/02/14 1100  LABPROT 15.5*  INR 1.21   ABG    Component Value Date/Time   PHART 7.346* 07/02/2014 1049   HCO3 24.2* 07/02/2014 1049   TCO2 26 07/02/2014 1049   ACIDBASEDEF 2.0 07/02/2014 1049   O2SAT 97.0 07/02/2014 1049   CBG (last 3)   Recent Labs  07/02/14 2357 07/03/14 0159  07/03/14 0515  GLUCAP 102* 118* 111*    Assessment/Plan: S/P Procedure(s) (LRB): TRANSCATHETER AORTIC VALVE REPLACEMENT, TRANSFEMORAL (N/A) TRANSESOPHAGEAL ECHOCARDIOGRAM (TEE) (N/A)  He has been hemodynamically stable without any further heart block. He does get bradycardic when sleeping and it is probably best to hold off on beta blocker for now.  DC foley, sleeve and A-line 2D echo today ASA and Plavix Transfer to 2W and mobilize.   LOS: 1 day    Alleen BorneBryan K Naphtali Riede 07/03/2014

## 2014-07-03 NOTE — Progress Notes (Signed)
Utilization Review Completed.Kajal Scalici T6/08/2014  

## 2014-07-03 NOTE — Progress Notes (Signed)
07/03/2014 1700 Pt. Ambulated 33540ft.  With front-wheeled walker and on RA.  Pt. Tolerated well. Kathryne HitchAllen, Nashalie Sallis C

## 2014-07-03 NOTE — Progress Notes (Signed)
  Echocardiogram 2D Echocardiogram has been performed.  Leta JunglingCooper, Arel Tippen M 07/03/2014, 10:55 AM

## 2014-07-03 NOTE — Progress Notes (Signed)
07/03/2014 1450 Received to room 2w22 a transfer from 2S.  Pt is A&O.  No c/o voiced.  Tele monitor applied and CCMD notified.  Oriented to room, call light and bed.  Call bell in reachand wife at bedside. Kathryne HitchAllen, Christian Borgerding C

## 2014-07-03 NOTE — Progress Notes (Signed)
    Subjective:  Feels well without chest pain or dyspnea.  Objective:  Vital Signs in the last 24 hours: Temp:  [96.6 F (35.9 C)-98.3 F (36.8 C)] 98.2 F (36.8 C) (06/08 0823) Pulse Rate:  [50-118] 60 (06/08 1200) Resp:  [12-27] 18 (06/08 1200) BP: (93-141)/(34-70) 118/46 mmHg (06/08 1200) SpO2:  [94 %-100 %] 100 % (06/08 1200) Arterial Line BP: (133-167)/(68-143) 133/76 mmHg (06/07 1400) Weight:  [198 lb (89.812 kg)] 198 lb (89.812 kg) (06/08 0500)  Intake/Output from previous day: 06/07 0701 - 06/08 0700 In: 2792.7 [I.V.:2542.7; IV Piggyback:250] Out: 1520 [Urine:1470; Blood:50]  Physical Exam: Pt is alert and oriented, NAD HEENT: normal Neck: JVP - normal Ext: no C/C/E, distal pulses intact and equal, bilateral groin sites are clear Skin: warm/dry no rash   Lab Results:  Recent Labs  07/02/14 1100 07/03/14 0430  WBC 6.2 7.1  HGB 11.3* 11.5*  PLT 138* 121*    Recent Labs  07/02/14 0854 07/02/14 1049 07/03/14 0430  NA 139 139 136  K 4.0 3.7 4.0  CL 103  --  100*  CO2  --   --  28  GLUCOSE 175* 146* 123*  BUN 9  --  12  CREATININE 1.00  --  1.23   No results for input(s): TROPONINI in the last 72 hours.  Invalid input(s): CK, MB  Cardiac Studies: 2D Echo POD #1: Study Conclusions  - Left ventricle: The cavity size was normal. Wall thickness was increased in a pattern of moderate LVH. The estimated ejection fraction was 60%. Wall motion was normal; there were no regional wall motion abnormalities. Findings consistent with left ventricular diastolic dysfunction. Doppler parameters are consistent with high ventricular filling pressure. - Aortic valve: Tissue prosthetic valve is working well. Mean gradient (S): 18 mm Hg. Peak gradient (S): 34 mm Hg. - Left atrium: The atrium was mildly dilated. - Right ventricle: The cavity size was normal. Systolic function was normal.  Tele: Sinus rhythm, sinus brady, long first degree AVB but  no high-grade heart block  Assessment/Plan:  1. Severe symptomatic aortic stenosis POD #1 from TAVR 2. Chronic diastolic CHF, NYHA Class 3 3. CAD, stable without angina 4. First degree AVB/Bradycardia 5. Type 2 DM  Pt looks great. Will move to 2W tele bed. No beta-blocker with PR>300 ms and episodes of bradycardia. Continue ASA and plavix. Post-op echo shows no paravalvular leak and acceptable gradients. Mobilize today. Cardiac rehab.   Tonny Bollmanooper, Damaree Sargent, M.D. 07/03/2014, 12:16 PM

## 2014-07-03 NOTE — Progress Notes (Signed)
Pt transferred to 2W22 with belongings. Report given to receiving RN and all questions answered. VSS during transfer.  Receiving RN at bedside. Pt assisted to chair in new room. Family at bedside.

## 2014-07-04 ENCOUNTER — Encounter (HOSPITAL_COMMUNITY): Payer: Self-pay | Admitting: General Practice

## 2014-07-04 ENCOUNTER — Ambulatory Visit (HOSPITAL_COMMUNITY): Payer: Medicare Other

## 2014-07-04 LAB — GLUCOSE, CAPILLARY
GLUCOSE-CAPILLARY: 159 mg/dL — AB (ref 65–99)
GLUCOSE-CAPILLARY: 80 mg/dL (ref 65–99)
Glucose-Capillary: 110 mg/dL — ABNORMAL HIGH (ref 65–99)
Glucose-Capillary: 141 mg/dL — ABNORMAL HIGH (ref 65–99)

## 2014-07-04 MED ORDER — METOPROLOL SUCCINATE ER 25 MG PO TB24
25.0000 mg | ORAL_TABLET | Freq: Every day | ORAL | Status: DC
  Administered 2014-07-04 – 2014-07-05 (×2): 25 mg via ORAL
  Filled 2014-07-04 (×2): qty 1

## 2014-07-04 NOTE — Progress Notes (Signed)
07/04/2014 1330 Pt. Ambulated in hallway with NT approximately 350 ft. Pt. Tolerated well. Encouraged further ambulation today with assistance. Pt. Verbalized understanding.  Brandolyn Shortridge, Blanchard Kelch

## 2014-07-04 NOTE — Progress Notes (Signed)
CARDIAC REHAB PHASE I   PRE:  Rate/Rhythm: 88 SR  BP:  Sitting: 123/61        SaO2: 99 RA  MODE:  Ambulation: 550 ft   POST:  Rate/Rhythm: 110 sT  BP:  Sitting: 138/75         SaO2: 98 RA  According to nursing staff, pt nearly fell yesterday. PT to see later today. Very pleasant, talkative this morning, up in chair, ready to walk. Pt ambulated 550 on RA, mildly unsteady gait, rolling walker, gait belt, assist x1. Pt denies CP, dizziness, SOB. Gave pt IS and reviewed use. Pt demonstrated and verbalized understanding. Pt to recliner after walk, chair alarm on, call bell within reach.   [phone]  Joylene Grapes, RN, BSN 07/04/2014 8:58 AM

## 2014-07-04 NOTE — Progress Notes (Addendum)
[address]       [place] [address]             [phone]      2 Days Post-Op Procedure(s) (LRB): TRANSCATHETER AORTIC VALVE REPLACEMENT, TRANSFEMORAL (N/A) TRANSESOPHAGEAL ECHOCARDIOGRAM (TEE) (N/A)   Subjective:  Mr. Serpa has no complaints.  States he is doing fairly well.  He did have a fall yesterday, but nursing staff caught him.  Objective: Vital signs in last 24 hours: Temp:  [97.5 F (36.4 C)-99 F (37.2 C)] 98.7 F (37.1 C) (06/09 0423) Pulse Rate:  [57-79] 79 (06/09 0423) Cardiac Rhythm:  [-] Normal sinus rhythm;Heart block (06/09 0740) Resp:  [16-21] 18 (06/09 0423) BP: (109-145)/(42-70) 115/59 mmHg (06/09 0423) SpO2:  [92 %-100 %] 94 % (06/09 0423) Weight:  [198 lb 10.2 oz (90.1 kg)] 198 lb 10.2 oz (90.1 kg) (06/09 0423)  Intake/Output from previous day: 06/08 0701 - 06/09 0700 In: 231.5 [P.O.:120; I.V.:111.5] Out: 140 [Urine:140]  General appearance: alert, cooperative and no distress Heart: regular rate and rhythm Lungs: clear to auscultation bilaterally Abdomen: soft, non-tender; bowel sounds normal; no masses,  no organomegaly Extremities: edema trace Wound: clean and dry  Lab Results:  Recent Labs  07/02/14 1100 07/03/14 0430  WBC 6.2 7.1  HGB 11.3* 11.5*  HCT 34.2* 35.1*  PLT 138* 121*   BMET:  Recent Labs  07/02/14 0854 07/02/14 1049 07/03/14 0430  NA 139 139 136  K 4.0 3.7 4.0  CL 103  --  100*  CO2  --   --  28  GLUCOSE 175* 146* 123*  BUN 9  --  12  CREATININE 1.00  --  1.23  CALCIUM  --   --  8.0*    PT/INR:  Recent Labs  07/02/14 1100  LABPROT 15.5*  INR 1.21   ABG    Component Value Date/Time   PHART 7.346* 07/02/2014 1049   HCO3 24.2* 07/02/2014 1049   TCO2 26 07/02/2014 1049   ACIDBASEDEF 2.0 07/02/2014 1049   O2SAT 97.0 07/02/2014 1049   CBG (last 3)   Recent Labs  07/03/14 1634 07/03/14 2118 07/04/14 0631  GLUCAP 140* 103* 110*    Assessment/Plan: S/P Procedure(s)  (LRB): TRANSCATHETER AORTIC VALVE REPLACEMENT, TRANSFEMORAL (N/A) TRANSESOPHAGEAL ECHOCARDIOGRAM (TEE) (N/A)  1. CV- Tachycardia, appears to be started, home Lopressor restarted at half dose per Cardiology 2. Pulm- no acute issues 3. DM- controlled, on amaryl 4. Deconditioning- PT consult placed 5. Disop- patient doing well, some Tachycardia, lopressor restarted, Care per Cardiology  LOS: 2 days    Lowella Dandy 07/04/2014   Chart reviewed, patient examined, agree with above. He is impulsive has some balance problems with walking yesterday. Hopefully this will improve rapidly with mobilization.   Postop echo ok             *[place]*         *[facility]*            [address]            [place], [place] [address]              [phone]  ------------------------------------------------------------------- Transthoracic Echocardiography  Patient:  [place], Souter MR #:    [id number] Study Date: 07/03/2014 Gender:   M Age:    81 Height:   172.7 cm Weight:   89.8 kg BSA:    2.1 m^2 Pt. Status: Room:    2S08C  ADMITTING  Tonny Bollman ATTENDING  Tonny Bollman ORDERING   Tonny Bollman REFERRING  Tonny Bollman PERFORMING  [facility], Inpatient SONOGRAPHER Leta Jungling, RDCS  cc:  ------------------------------------------------------------------- LV EF: 60%  ------------------------------------------------------------------- Indications:   Aortic stenosis 424.1.  ------------------------------------------------------------------- History:  PMH:  Coronary artery disease. Risk factors: Hypertension. Diabetes mellitus.  ------------------------------------------------------------------- Study Conclusions  - Left ventricle: The cavity size was normal. Wall thickness was increased in a pattern of moderate LVH. The estimated  ejection fraction was 60%. Wall motion was normal; there were no regional wall motion abnormalities. Findings consistent with left ventricular diastolic dysfunction. Doppler parameters are consistent with high ventricular filling pressure. - Aortic valve: Tissue prosthetic valve is working well. Mean gradient (S): 18 mm Hg. Peak gradient (S): 34 mm Hg. - Left atrium: The atrium was mildly dilated. - Right ventricle: The cavity size was normal. Systolic function was normal.  ------------------------------------------------------------------- Labs, prior tests, procedures, and surgery: (07/02/2014).   Aortic valve replacement with a bioprosthetic valve. Transthoracic echocardiography. M-mode, complete 2D, spectral Doppler, and color Doppler. Birthdate: Patient birthdate: September 17, 1931. Age: Patient is 81 yr old. Sex: Gender: male. BMI: 30.1 kg/m^2. Blood pressure:   118/51 Patient status: Inpatient. Study date: Study date: 07/03/2014. Study time: 10:11 AM. Location: ICU/CCU  -------------------------------------------------------------------  ------------------------------------------------------------------- Left ventricle: The cavity size was normal. Wall thickness was increased in a pattern of moderate LVH. The estimated ejection fraction was 60%. Wall motion was normal; there were no regional wall motion abnormalities. Findings consistent with left ventricular diastolic dysfunction. Doppler parameters are consistent with high ventricular filling pressure.  ------------------------------------------------------------------- Aortic valve: Tissue prosthetic valve is working well. Doppler: VTI ratio of LVOT to aortic valve: 0.56. Valve area (VTI): 0.75 cm^2. Indexed valve area (VTI): 0.36 cm^2/m^2. Peak velocity ratio of LVOT to aortic valve: 0.5. Valve area (Vmax): 0.67 cm^2. Indexed valve area (Vmax): 0.32 cm^2/m^2. Mean velocity ratio of LVOT  to aortic valve: 0.55. Valve area (Vmean): 0.73 cm^2. Indexed valve area (Vmean): 0.35 cm^2/m^2.  Mean gradient (S): 18 mm Hg. Peak gradient (S): 34 mm Hg.  ------------------------------------------------------------------- Aorta: Aortic root: The aortic root was normal in size.  ------------------------------------------------------------------- Mitral valve:  Structurally normal valve.  Leaflet separation was normal. Doppler: Transvalvular velocity was within the normal range. There was no evidence for stenosis. There was no regurgitation.  Peak gradient (D): 4 mm Hg.  ------------------------------------------------------------------- Left atrium: The atrium was mildly dilated.  ------------------------------------------------------------------- Right ventricle: The cavity size was normal. Systolic function was normal.  ------------------------------------------------------------------- Pulmonic valve:  The valve appears to be grossly normal. Doppler: There was no significant regurgitation.  ------------------------------------------------------------------- Tricuspid valve:  Structurally normal valve.  Leaflet separation was normal. Doppler: Transvalvular velocity was within the normal range. There was no regurgitation.  ------------------------------------------------------------------- Right atrium: Poorly visualized.  ------------------------------------------------------------------- Pericardium: There was no pericardial effusion.  ------------------------------------------------------------------- Measurements  Left ventricle              Value     Reference LV ID, ED, PLAX chordal      (L)   40.7 mm    43 - 52 LV ID, ES, PLAX chordal          27.6 mm    23 - 38 LV fx shortening, PLAX chordal      32  %    >=29 LV PW thickness, ED            15.6 mm    --------- IVS/LV  PW ratio, ED  1.1      <=1.3 Stroke volume, 2D             40  ml    --------- Stroke volume/bsa, 2D           19  ml/m^2  --------- LV e&', lateral              4.03 cm/s   --------- LV E/e&', lateral             23.23     --------- LV e&', medial               4.35 cm/s   --------- LV E/e&', medial              21.52     --------- LV e&', average              4.19 cm/s   --------- LV E/e&', average             22.34     ---------  Ventricular septum            Value     Reference IVS thickness, ED             17.1 mm    ---------  LVOT                   Value     Reference LVOT ID, S                13  mm    --------- LVOT area                 1.33 cm^2   --------- LVOT peak velocity, S           147  cm/s   --------- LVOT mean velocity, S           99.4 cm/s   --------- LVOT VTI, S                30  cm    --------- LVOT peak gradient, S           9   mm Hg  ---------  Aortic valve               Value     Reference Aortic valve peak velocity, S       292  cm/s   --------- Aortic valve mean velocity, S       182  cm/s   --------- Aortic valve VTI, S            53.3 cm    --------- Aortic mean gradient, S          18  mm Hg  --------- Aortic peak gradient, S          34  mm Hg  --------- VTI ratio, LVOT/AV            0.56      --------- Aortic valve area, VTI          0.75 cm^2   --------- Aortic valve area/bsa, VTI        0.36 cm^2/m^2 --------- Velocity ratio, peak, LVOT/AV       0.5      --------- Aortic valve  area, peak velocity     0.67 cm^2   --------- Aortic valve area/bsa, peak        0.32 cm^2/m^2 --------- velocity Velocity ratio, mean, LVOT/AV  0.55      --------- Aortic valve area, mean velocity     0.73 cm^2   --------- Aortic valve area/bsa, mean        0.35 cm^2/m^2 --------- velocity  Aorta                   Value     Reference Aortic root ID, ED            21  mm    ---------  Left atrium                Value     Reference LA ID, A-P, ES              42  mm    --------- LA ID/bsa, A-P              2   cm/m^2  <=2.2 LA volume, ES, 1-p A4C          101  ml    --------- LA volume/bsa, ES, 1-p A4C        48.1 ml/m^2  --------- LA volume, ES, 1-p A2C          77.1 ml    --------- LA volume/bsa, ES, 1-p A2C        36.7 ml/m^2  ---------  Mitral valve               Value     Reference Mitral E-wave peak velocity        93.6 cm/s   --------- Mitral A-wave peak velocity        124  cm/s   --------- Mitral deceleration time         211  ms    150 - 230 Mitral peak gradient, D          4   mm Hg  --------- Mitral E/A ratio, peak          0.8      ---------  Systemic veins              Value     Reference Estimated CVP               3   mm Hg  ---------  Right ventricle              Value     Reference TAPSE                   20.9 mm    --------- RV s&', lateral, S             10.6 cm/s   ---------  Legend: (L) and (H) mark values outside specified reference range.  ------------------------------------------------------------------- Prepared and Electronically Authenticated by  Willa Rough, MD 2016-06-08T11:48:33     Plan home tomorrow if ambulating better and safe to go home with his family.

## 2014-07-04 NOTE — Progress Notes (Signed)
    Subjective:  Doing ok. Feels unsteady when he is up on his feet. No chest pain or shortness of breath.  Objective:  Vital Signs in the last 24 hours: Temp:  [97.5 F (36.4 C)-99 F (37.2 C)] 98.7 F (37.1 C) (06/09 0423) Pulse Rate:  [57-79] 79 (06/09 0423) Resp:  [15-21] 18 (06/09 0423) BP: (93-145)/(42-70) 115/59 mmHg (06/09 0423) SpO2:  [92 %-100 %] 94 % (06/09 0423) Weight:  [198 lb 10.2 oz (90.1 kg)] 198 lb 10.2 oz (90.1 kg) (06/09 0423)  Intake/Output from previous day: 06/08 0701 - 06/09 0700 In: 231.5 [P.O.:120; I.V.:111.5] Out: 140 [Urine:140]  Physical Exam: Pt is alert and oriented, elderly male in  NAD HEENT: normal Neck: JVP - normal Lungs: CTA bilaterally CV: tachy and regular with 2/6 early systolic murmur at the LSB Abd: soft, NT, Positive BS, no hepatomegaly Ext: no C/C/E, distal pulses intact and equal Skin: warm/dry no rash   Lab Results:  Recent Labs  07/02/14 1100 07/03/14 0430  WBC 6.2 7.1  HGB 11.3* 11.5*  PLT 138* 121*    Recent Labs  07/02/14 0854 07/02/14 1049 07/03/14 0430  NA 139 139 136  K 4.0 3.7 4.0  CL 103  --  100*  CO2  --   --  28  GLUCOSE 175* 146* 123*  BUN 9  --  12  CREATININE 1.00  --  1.23   No results for input(s): TROPONINI in the last 72 hours.  Invalid input(s): CK, MB  Cardiac Studies: 2D Echo (post-op study): Study Conclusions  - Left ventricle: The cavity size was normal. Wall thickness was increased in a pattern of moderate LVH. The estimated ejection fraction was 60%. Wall motion was normal; there were no regional wall motion abnormalities. Findings consistent with left ventricular diastolic dysfunction. Doppler parameters are consistent with high ventricular filling pressure. - Aortic valve: Tissue prosthetic valve is working well. Mean gradient (S): 18 mm Hg. Peak gradient (S): 34 mm Hg. - Left atrium: The atrium was mildly dilated. - Right ventricle: The cavity size was  normal. Systolic function was normal.  Tele: Sinus rhythm/sinus tach  Assessment/Plan:  1. Severe symptomatic aortic stenosis POD #2 from TAVR 2. Chronic diastolic CHF, NYHA Class 3 - stable 3. CAD, stable without angina 4. First degree AVB/Bradycardia - now with episodes of tachycardia - appears sinus on tele review with heart rate up to 120 bpm 5. Type 2 DM - cbg's controlled glucose 110 this am  Pt appears stable. He is having episodes of tachycardia and I think this is sinus - difficult to tell with his long PR. Will add back Toprol XL at half of his home dose today. He has gait unsteadiness - will order PT eval he may benefit from HHPT. Anticipate dc home tomorrow am if rhythm stable.   Tonny Bollman, M.D. 07/04/2014, 6:54 AM

## 2014-07-05 LAB — GLUCOSE, CAPILLARY: Glucose-Capillary: 117 mg/dL — ABNORMAL HIGH (ref 65–99)

## 2014-07-05 MED ORDER — FAMOTIDINE 20 MG PO TABS: 20.0000 mg | ORAL_TABLET | Freq: Two times a day (BID) | ORAL | Status: DC | PRN

## 2014-07-05 MED ORDER — DOCUSATE SODIUM 100 MG PO CAPS: 200.0000 mg | ORAL_CAPSULE | Freq: Every day | ORAL | Status: DC

## 2014-07-05 MED ORDER — METOPROLOL SUCCINATE ER 25 MG PO TB24: 25.0000 mg | ORAL_TABLET | Freq: Every day | ORAL | Status: DC

## 2014-07-05 NOTE — Discharge Summary (Signed)
CARDIOLOGY DISCHARGE SUMMARY   Patient ID: Michael Wolfe MRN: [id number] DOB/AGE: 1931/05/22 81 y.o.  Admit date: 07/02/2014 Discharge date: 07/05/2014  PCP: Rinaldo Cloud, MD Primary Cardiologist: Dr. Sharyn Lull TAVR M.D.: Dr. Excell Seltzer  Primary Discharge Diagnosis:  Severe aortic stenosis  Secondary Discharge Diagnosis:  Diabetes Hypertension  Consults: Cardiothoracic surgery  Procedures:   Transcatheter Aortic Valve Replacement - Transfemoral Approach with transesophageal echocardiogram Edwards Sapien 3 THV (size 26 mm, model # 9600TFX, serial # [id number])   Hospital Course: Michael Wolfe is a 81 y.o. male with a history of severe aortic stenosis, diabetes, and hypertension. He was evaluated for TAVR and this was felt to be the best option. He came to the hospital for the procedure on 07/02/2014.  He had a transcatheter aortic valve replacement with an intraoperative TEE, left transfemoral approach and tolerated the procedure well. Chest x-rays were performed with the results below and he had no significant volume overload after the procedure. He was followed by Dr. Laneta Simmers and Dr. Excell Seltzer during his hospital stay.   He was hemodynamically stable but had some problems with bradycardia, especially while asleep. For this reason, he was not initially on a beta blocker, but now tolerates a low dose.  A postoperative 2-D echocardiogram was performed on 06/08, results are below. The mean gradient was 18 mmHg.  He was seen by cardiac rehabilitation and his activity was increased successfully. He was evaluated by physical therapy, but physical therapy after discharge was not indicated, he is to continue cardiac rehabilitation. He will do phase II cardiac rehabilitation after discharge.  On 07/05/2014, he was seen by Dr. Excell Seltzer and all data were reviewed. He was having no bleeding issues on the aspirin and Plavix. He is to restart his Glucophage at discharge and is on his  home dose of Amaryl as well. No further inpatient workup is indicated and he is considered stable for discharge, to follow up as an outpatient.  He will follow-up with Dr. Laneta Simmers in approximately 2 weeks and get a repeat echocardiogram with an office visit in approximately one month. Follow-up otherwise will be per Dr. Sharyn Lull.  Labs:   Lab Results  Component Value Date   WBC 7.1 07/03/2014   HGB 11.5* 07/03/2014   HCT 35.1* 07/03/2014   MCV 92.1 07/03/2014   PLT 121* 07/03/2014     Recent Labs Lab 07/03/14 0430  NA 136  K 4.0  CL 100*  CO2 28  BUN 12  CREATININE 1.23  CALCIUM 8.0*  GLUCOSE 123*      Radiology:  Dg Chest Port 1 View 07/03/2014   CLINICAL DATA:  Aortic valve replacement.  EXAM: PORTABLE CHEST - 1 VIEW  COMPARISON:  07/02/2014.  FINDINGS: Interim removal Swan-Ganz catheter. Right IJ sheath in good anatomic position. Mediastinum hilar structures normal. Aortic valve replacement. Cardiomegaly with normal pulmonary vascularity. Low lung volumes with mild bibasilar atelectasis. No pneumothorax.  IMPRESSION: 1. Interim removal Swan-Ganz catheter. Right IJ sheath in stable position. 2. Stable cardiomegaly.  No pulmonary venous congestion. 3. Mild bibasilar subsegmental atelectasis.   Electronically Signed   By: Maisie Fus  Register   On: 07/03/2014 07:53   Dg Chest Port 1 View 07/02/2014   CLINICAL DATA:  Post transcatheter aortic valve replacement  EXAM: PORTABLE CHEST - 1 VIEW  COMPARISON:  Chest x-ray of 06/28/2014 and CT chest of 05/28/2014  FINDINGS: The lungs are not as well aerated postoperatively. Minimal volume loss is noted. A Swan-Ganz catheter is present  with the tip in the right main pulmonary artery. No pneumothorax is seen. Cardiomegaly is stable.  IMPRESSION: Slightly diminished aeration. Swan-Ganz catheter tip in the region of the right main pulmonary artery.   Electronically Signed   By: Dwyane Dee M.D.   On: 07/02/2014 11:53    EKG: 07/02/2014 Sinus rhythm,  first-degree AV block  Echo: 07/03/2014 Conclusions - Left ventricle: The cavity size was normal. Wall thickness was increased in a pattern of moderate LVH. The estimated ejection fraction was 60%. Wall motion was normal; there were no regional wall motion abnormalities. Findings consistent with left ventricular diastolic dysfunction. Doppler parameters are consistent with high ventricular filling pressure. - Aortic valve: Tissue prosthetic valve is working well. Mean gradient (S): 18 mm Hg. Peak gradient (S): 34 mm Hg. - Left atrium: The atrium was mildly dilated. - Right ventricle: The cavity size was normal. Systolic function was normal.  FOLLOW UP PLANS AND APPOINTMENTS No Known Allergies   Medication List    STOP taking these medications        lisinopril 40 MG tablet  Commonly known as:  PRINIVIL,ZESTRIL      TAKE these medications        acetaminophen 500 MG tablet  Commonly known as:  TYLENOL  Take 500 mg by mouth every 6 (six) hours as needed for pain.     amLODipine 5 MG tablet  Commonly known as:  NORVASC  Take 5 mg by mouth 2 (two) times daily.     aspirin 81 MG tablet  Take 81 mg by mouth daily.     clopidogrel 75 MG tablet  Commonly known as:  PLAVIX  Take 75 mg by mouth daily.     docusate sodium 100 MG capsule  Commonly known as:  COLACE  Take 2 capsules (200 mg total) by mouth daily.     famotidine 20 MG tablet  Commonly known as:  PEPCID  Take 1 tablet (20 mg total) by mouth 2 (two) times daily as needed for heartburn or indigestion.     fish oil-omega-3 fatty acids 1000 MG capsule  Take 3 g by mouth daily.     glimepiride 2 MG tablet  Commonly known as:  AMARYL  Take 2 mg by mouth daily.     metFORMIN 500 MG tablet  Commonly known as:  GLUCOPHAGE  Take 500-1,000 mg by mouth 2 (two) times daily with a meal. Takes 2 tablets in am and 1 tablet in pm     metoprolol succinate 25 MG 24 hr tablet  Commonly known as:  TOPROL-XL    Take 1 tablet (25 mg total) by mouth daily. Take with or immediately following a meal.     OVER THE COUNTER MEDICATION  Take 1-2 tablets by mouth at bedtime as needed. For sleep. Equate brand Nighttime Sleep Aid     rosuvastatin 20 MG tablet  Commonly known as:  CRESTOR  Take 20 mg by mouth daily.        Discharge Instructions    Amb Referral to Cardiac Rehabilitation    Complete by:  As directed   Congestive Heart Failure: If diagnosis is Heart Failure, patient MUST meet each of the CMS criteria: 1. Left Ventricular Ejection Fraction </= 35% 2. NYHA class II-IV symptoms despite being on optimal heart failure therapy for at least 6 weeks. 3. Stable = have not had a recent (<6 weeks) or planned (<6 months) major cardiovascular hospitalization or procedure  Program Details: - Physician supervised  classes - 1-3 classes per week over a 12-18 week period, generally for a total of 36 sessions  Physician Certification: I certify that the above Cardiac Rehabilitation treatment is medically necessary and is medically approved by me for treatment of this patient. The patient is willing and cooperative, able to ambulate and medically stable to participate in exercise rehabilitation. The participant's progress and Individualized Treatment Plan will be reviewed by the Medical Director, Cardiac Rehab staff and as indicated by the Referring/Ordering Physician.  Diagnosis:  Valve Replacement/Repair Comment - TAVR  Valve:  Aortic     Call MD for:  redness, tenderness, or signs of infection (pain, swelling, redness, odor or green/yellow discharge around incision site)    Complete by:  As directed      Call MD for:  temperature >100.4    Complete by:  As directed      Diet - low sodium heart healthy    Complete by:  As directed      Diet Carb Modified    Complete by:  As directed      Increase activity slowly    Complete by:  As directed           Follow-up Information    Follow up with  Alleen Borne, MD On 07/16/2014.   Specialty:  Cardiothoracic Surgery   Why:  See MD at 3:00 pm, please arrive about 15 minutes early for paperwork.   Contact information:   [address] [employer] [address] [place] [address] [phone]       Follow up with [facility] On 08/12/2014.   Why:  2-D echocardiogram at 11:30 AM, please arrive about 15 minutes early for paperwork.   Contact information:   [address] [place] [address]       Follow up with Jacolyn Reedy, PA-C On 08/12/2014.   Specialty:  Cardiology   Why:  See provider at 1:15 PM   Contact information:   [address] [place] [address] [phone]       Schedule an appointment as soon as possible for a visit with Rinaldo Cloud, MD.   Specialty:  Cardiology   Contact information:   [address] [place] [address] [phone]       BRING ALL MEDICATIONS WITH YOU TO FOLLOW UP APPOINTMENTS  Time spent with patient to include physician time: 41 min Signed: Theodore Demark, PA-C 07/05/2014, 3:47 PM Co-Sign MD

## 2014-07-05 NOTE — Care Management Note (Signed)
Case Management Note  Patient Details  Name: Michael Wolfe MRN: [id number] Date of Birth: Feb 01, 1931  Subjective/Objective:  Pt admitted s/p AVR                Action/Plan: PTA pt lived at home- PT eval ordered and no f/u recommended other than Cardiac rehab  Expected Discharge Date:       07/05/14           Expected Discharge Plan:  Home w Home Health Services  In-House Referral:     Discharge planning Services  CM Consult  Post Acute Care Choice:    Choice offered to:     DME Arranged:    DME Agency:     HH Arranged:    HH Agency:     Status of Service:  Completed, signed off  Medicare Important Message Given:  Yes Date Medicare IM Given:  07/05/14 Medicare IM give by:  Donn Pierini RN BSN  Date Additional Medicare IM Given:    Additional Medicare Important Message give by:     If discussed at Long Length of Stay Meetings, dates discussed:    Additional Comments:  Darrold Span, RN [phone] 07/05/2014, 3:09 PM

## 2014-07-05 NOTE — Progress Notes (Signed)
    Subjective:  Doing well this morning. No complaints. Denies chest pain or shortness of breath.  Objective:  Vital Signs in the last 24 hours: Temp:  [98.1 F (36.7 C)-98.4 F (36.9 C)] 98.4 F (36.9 C) (06/10 0337) Pulse Rate:  [67-70] 69 (06/10 0337) Resp:  [18] 18 (06/10 0337) BP: (123-138)/(54-68) 135/54 mmHg (06/10 0337) SpO2:  [93 %-98 %] 94 % (06/10 0337) Weight:  [193 lb 12.6 oz (87.9 kg)] 193 lb 12.6 oz (87.9 kg) (06/10 0337)  Intake/Output from previous day: 06/09 0701 - 06/10 0700 In: 723 [P.O.:720; I.V.:3] Out: -   Physical Exam: Pt is alert and oriented, pleasant elderly man in NAD HEENT: normal Neck: JVP - normal Lungs: CTA bilaterally CV: RRR soft systolic ejection murmur at the right upper sternal border Abd: soft, NT, Positive BS, no hepatomegaly Ext: no C/C/E, distal pulses intact and equal Skin: warm/dry no rash   Lab Results:  Recent Labs  07/02/14 1100 07/03/14 0430  WBC 6.2 7.1  HGB 11.3* 11.5*  PLT 138* 121*    Recent Labs  07/02/14 0854 07/02/14 1049 07/03/14 0430  NA 139 139 136  K 4.0 3.7 4.0  CL 103  --  100*  CO2  --   --  28  GLUCOSE 175* 146* 123*  BUN 9  --  12  CREATININE 1.00  --  1.23   No results for input(s): TROPONINI in the last 72 hours.  Invalid input(s): CK, MB  Tele: Personally reviewed: Sinus rhythm, no arrhythmia identified  Assessment/Plan:  1. Severe symptomatic aortic stenosis POD #3 from TAVR 2. Chronic diastolic CHF, NYHA Class 3 - stable and ambulatory without symptoms after TAVR 3. CAD, stable without angina 4. First degree AVB/Bradycardia - rhythm has stabilized. He is back on low-dose metoprolol succinate. Would discharge on Toprol-XL 25 mg daily 5. Type 2 DM - cbg's 80-159 last 24 hours. Okay to resume metformin at discharge. Continue glimepiride.  Disposition: The patient appears stable for discharge. He did have some gait instability and is awaiting physical therapy assessment. I suspect  he will be fine for discharge home, possibly with home physical therapy. He should continue on dual antiplatelets therapy with aspirin and Plavix. Will contact him with a 30 day valve clinic appointment and follow-up echocardiogram that same date. He should have a transition of care visit in the next 1-2 weeks as well. Long-term follow-up will be with Dr. Sharyn Lull.   Tonny Bollman, M.D. 07/05/2014, 8:31 AM

## 2014-07-05 NOTE — Progress Notes (Signed)
Pt ambulated around unit this morning with NT using walker. Pt tolerated well and returned to chair in room. RN will cont to monitor.

## 2014-07-05 NOTE — Evaluation (Signed)
Physical Therapy Evaluation Patient Details Name: Michael Wolfe MRN: [id number] DOB: 05-19-1931 Today's Date: 07/05/2014   History of Present Illness  Patient is an 81 yo male admitted 07/02/14 with severe AS, now s/p TAVR.  PMH:  CAD, MI, AS, HTN, DM, OA  Clinical Impression  Patient is functioning at his baseline level.  Able to ambulate 375' with no assistive device with no loss of balance.  Patient to have f/u in Cardiac Rehab Phase II Program.  No f/u PT needs identified at this time.    Follow Up Recommendations No PT follow up;Supervision/Assistance - 24 hour;Other (comment) (Cardiac Rehab Phase II Program f/u)    Equipment Recommendations  None recommended by PT    Recommendations for Other Services       Precautions / Restrictions Precautions Precautions: Fall Precaution Comments: Impulsive Restrictions Weight Bearing Restrictions: No      Mobility  Bed Mobility               General bed mobility comments: Patient in chair as PT entered room  Transfers Overall transfer level: Needs assistance Equipment used: None Transfers: Sit to/from Stand Sit to Stand: Supervision         General transfer comment: Cues to move slowly for safety  Ambulation/Gait Ambulation/Gait assistance: Supervision Ambulation Distance (Feet): 375 Feet Assistive device: None Gait Pattern/deviations: Step-through pattern;Decreased stride length   Gait velocity interpretation: at or above normal speed for age/gender General Gait Details: Patient able to ambulate at good gait speed with no loss of balance noted.  No dyspnea with gait.  Stairs            Wheelchair Mobility    Modified Rankin (Stroke Patients Only)       Balance Overall balance assessment: Needs assistance Sitting-balance support: No upper extremity supported;Feet supported Sitting balance-Leahy Scale: Good     Standing balance support: No upper extremity supported Standing balance-Leahy Scale:  Good                               Pertinent Vitals/Pain Pain Assessment: No/denies pain    Home Living Family/patient expects to be discharged to:: Private residence Living Arrangements: Spouse/significant other Available Help at Discharge: Family;Available 24 hours/day Type of Home: House Home Access: Stairs to enter Entrance Stairs-Rails: Doctor, general practice of Steps: 8 Home Layout: Two level;Able to live on main level with bedroom/bathroom Home Equipment: Dan Humphreys - 2 wheels;Walker - 4 wheels;Bedside commode;Shower seat      Prior Function Level of Independence: Independent;Needs assistance      ADL's / Homemaking Assistance Needed: Wife assists with bathing - washing patient's back.  Comments: Drives     Hand Dominance        Extremity/Trunk Assessment   Upper Extremity Assessment: Overall WFL for tasks assessed           Lower Extremity Assessment: Overall WFL for tasks assessed      Cervical / Trunk Assessment: Normal  Communication   Communication: No difficulties (Very talkative)  Cognition Arousal/Alertness: Awake/alert Behavior During Therapy: Impulsive;Restless Overall Cognitive Status: Within Functional Limits for tasks assessed (At baseline, but is impulsive, decreased safety awareness at)                      General Comments      Exercises        Assessment/Plan    PT Assessment Patent does not need any further  PT services (Patient to have f/u in Cardiac Rehab Phase II program)  PT Diagnosis Difficulty walking   PT Problem List    PT Treatment Interventions     PT Goals (Current goals can be found in the Care Plan section) Acute Rehab PT Goals PT Goal Formulation: All assessment and education complete, DC therapy    Frequency     Barriers to discharge        Co-evaluation               End of Session   Activity Tolerance: Patient tolerated treatment well Patient left: in chair;with  call bell/phone within reach;with chair alarm set;with family/visitor present Nurse Communication: Mobility status         Time: 6283-1517 PT Time Calculation (min) (ACUTE ONLY): 36 min   Charges:   PT Evaluation $Initial PT Evaluation Tier I: 1 Procedure PT Treatments $Gait Training: 8-22 mins   PT G CodesVena Austria Jul 20, 2014, 12:48 PM Durenda Hurt. Renaldo Fiddler, [facility] Acute Rehab Services Pager [phone]

## 2014-07-05 NOTE — Progress Notes (Signed)
Discharged to home with family office visits in place teaching done  

## 2014-07-05 NOTE — Progress Notes (Signed)
CARDIAC REHAB PHASE I   Pt just finished ambulating with nursing staff this morning, tolerated well per staff and pt. PT to see prior to discharge home today. Completed TAVR discharge education. Reviewed IS, activity restrictions, exercise guidelines, heart healthy diet, sodium restrictions, carb counting and phase 2 cardiac rehab. Pt verbalized understanding. Pt states he is going to do the best he can to "eat what's good for me" but that he "won't be perfect." Pt states he completed phase 2 cardiac rehab when he had his stents, agreeable to phase 2 again. Will send referral to [facility] per pt request. Pt in recliner, chair alarm on, call bell within reach.   [phone]  Joylene Grapes, RN, BSN 07/05/2014 9:59 AM

## 2014-07-08 LAB — TYPE AND SCREEN
ABO/RH(D): AB POS
Antibody Screen: NEGATIVE
Unit division: 0
Unit division: 0

## 2014-07-16 ENCOUNTER — Ambulatory Visit (INDEPENDENT_AMBULATORY_CARE_PROVIDER_SITE_OTHER): Payer: Self-pay | Admitting: Surgery

## 2014-07-16 ENCOUNTER — Encounter: Payer: Self-pay | Admitting: Surgery

## 2014-07-16 VITALS — BP 122/66 | HR 65 | Resp 20 | Ht 67.0 in | Wt 193.0 lb

## 2014-07-16 DIAGNOSIS — I350 Nonrheumatic aortic (valve) stenosis: Secondary | ICD-10-CM

## 2014-07-16 DIAGNOSIS — Z952 Presence of prosthetic heart valve: Secondary | ICD-10-CM

## 2014-07-16 DIAGNOSIS — Z954 Presence of other heart-valve replacement: Secondary | ICD-10-CM

## 2014-07-16 NOTE — Progress Notes (Signed)
HPI:  Patient returns for routine postoperative follow-up having undergone left transfemoral TAVR on 07/02/2014. The patient's early postoperative recovery while in the hospital was notable for an uncomplicated postop course. Since hospital discharge the patient reports that he is feeling well and is back riding his lawn mower.   Current Outpatient Prescriptions  Medication Sig Dispense Refill  . acetaminophen (TYLENOL) 500 MG tablet Take 500 mg by mouth every 6 (six) hours as needed for pain.    Marland Kitchen amLODipine (NORVASC) 5 MG tablet Take 5 mg by mouth 2 (two) times daily.    Marland Kitchen aspirin 81 MG tablet Take 81 mg by mouth daily.    . clopidogrel (PLAVIX) 75 MG tablet Take 75 mg by mouth daily.    Marland Kitchen docusate sodium (COLACE) 100 MG capsule Take 2 capsules (200 mg total) by mouth daily. 10 capsule 0  . famotidine (PEPCID) 20 MG tablet Take 1 tablet (20 mg total) by mouth 2 (two) times daily as needed for heartburn or indigestion.    . fish oil-omega-3 fatty acids 1000 MG capsule Take 3 g by mouth daily.     Marland Kitchen glimepiride (AMARYL) 2 MG tablet Take 2 mg by mouth daily.     . metFORMIN (GLUCOPHAGE) 500 MG tablet Take 500-1,000 mg by mouth 2 (two) times daily with a meal. Takes 2 tablets in am and 1 tablet in pm    . metoprolol succinate (TOPROL-XL) 25 MG 24 hr tablet Take 1 tablet (25 mg total) by mouth daily. Take with or immediately following a meal. 30 tablet 11  . OVER THE COUNTER MEDICATION Take 1-2 tablets by mouth at bedtime as needed. For sleep. Equate brand Nighttime Sleep Aid    . rosuvastatin (CRESTOR) 20 MG tablet Take 20 mg by mouth daily.     No current facility-administered medications for this visit.    Physical Exam: BP 122/66 mmHg  Pulse 65  Resp 20  Ht 5\' 7"  (1.702 m)  Wt 193 lb (87.544 kg)  BMI 30.22 kg/m2  SpO2 97% He looks well Lungs are clear Cardiac exam shows a regular rate and rhythm with normal heart sounds. The groin incision is healing well. The right  groin catheter sites look good. There is no peripheral edema   Diagnostic Tests:  POD 1 echo        *[place]*         *[facility]*            [address]            [place], [place] [address]              [phone]  ------------------------------------------------------------------- Transthoracic Echocardiography  Patient:  Michael Wolfe, Michael Wolfe MR #:    [id number] Study Date: 07/03/2014 Gender:   M Age:    82 Height:   172.7 cm Weight:   89.8 kg BSA:    2.1 m^2 Pt. Status: Room:    2S08C  ADMITTING  Tonny Bollman ATTENDING  Tonny Bollman ORDERING   Tonny Bollman REFERRING  Tonny Bollman PERFORMING  [facility], Inpatient SONOGRAPHER Leta Jungling, RDCS  cc:  ------------------------------------------------------------------- LV EF: 60%  ------------------------------------------------------------------- Indications:   Aortic stenosis 424.1.  ------------------------------------------------------------------- History:  PMH:  Coronary artery disease. Risk factors: Hypertension. Diabetes mellitus.  ------------------------------------------------------------------- Study Conclusions  - Left ventricle: The cavity size was normal. Wall thickness was increased in a pattern of moderate LVH. The estimated ejection fraction was 60%. Wall motion  was normal; there were no regional wall motion abnormalities. Findings consistent with left ventricular diastolic dysfunction. Doppler parameters are consistent with high ventricular filling pressure. - Aortic valve: Tissue prosthetic valve is working well. Mean gradient (S): 18 mm Hg. Peak gradient (S): 34 mm Hg. - Left atrium: The atrium was mildly dilated. - Right ventricle: The cavity size was normal. Systolic function was  normal.  ------------------------------------------------------------------- Labs, prior tests, procedures, and surgery: (07/02/2014).   Aortic valve replacement with a bioprosthetic valve. Transthoracic echocardiography. M-mode, complete 2D, spectral Doppler, and color Doppler. Birthdate: Patient birthdate: Jul 20, 1931. Age: Patient is 81 yr old. Sex: Gender: male. BMI: 30.1 kg/m^2. Blood pressure:   118/51 Patient status: Inpatient. Study date: Study date: 07/03/2014. Study time: 10:11 AM. Location: ICU/CCU  -------------------------------------------------------------------  ------------------------------------------------------------------- Left ventricle: The cavity size was normal. Wall thickness was increased in a pattern of moderate LVH. The estimated ejection fraction was 60%. Wall motion was normal; there were no regional wall motion abnormalities. Findings consistent with left ventricular diastolic dysfunction. Doppler parameters are consistent with high ventricular filling pressure.  ------------------------------------------------------------------- Aortic valve: Tissue prosthetic valve is working well. Doppler: VTI ratio of LVOT to aortic valve: 0.56. Valve area (VTI): 0.75 cm^2. Indexed valve area (VTI): 0.36 cm^2/m^2. Peak velocity ratio of LVOT to aortic valve: 0.5. Valve area (Vmax): 0.67 cm^2. Indexed valve area (Vmax): 0.32 cm^2/m^2. Mean velocity ratio of LVOT to aortic valve: 0.55. Valve area (Vmean): 0.73 cm^2. Indexed valve area (Vmean): 0.35 cm^2/m^2.  Mean gradient (S): 18 mm Hg. Peak gradient (S): 34 mm Hg.  ------------------------------------------------------------------- Aorta: Aortic root: The aortic root was normal in size.  ------------------------------------------------------------------- Mitral valve:  Structurally normal valve.  Leaflet separation was normal. Doppler: Transvalvular velocity was within the  normal range. There was no evidence for stenosis. There was no regurgitation.  Peak gradient (D): 4 mm Hg.  ------------------------------------------------------------------- Left atrium: The atrium was mildly dilated.  ------------------------------------------------------------------- Right ventricle: The cavity size was normal. Systolic function was normal.  ------------------------------------------------------------------- Pulmonic valve:  The valve appears to be grossly normal. Doppler: There was no significant regurgitation.  ------------------------------------------------------------------- Tricuspid valve:  Structurally normal valve.  Leaflet separation was normal. Doppler: Transvalvular velocity was within the normal range. There was no regurgitation.  ------------------------------------------------------------------- Right atrium: Poorly visualized.  ------------------------------------------------------------------- Pericardium: There was no pericardial effusion.  ------------------------------------------------------------------- Measurements  Left ventricle              Value     Reference LV ID, ED, PLAX chordal      (L)   40.7 mm    43 - 52 LV ID, ES, PLAX chordal          27.6 mm    23 - 38 LV fx shortening, PLAX chordal      32  %    >=29 LV PW thickness, ED            15.6 mm    --------- IVS/LV PW ratio, ED            1.1      <=1.3 Stroke volume, 2D             40  ml    --------- Stroke volume/bsa, 2D           19  ml/m^2  --------- LV e&', lateral              4.03 cm/s   --------- LV E/e&', lateral  23.23     --------- LV e&', medial               4.35 cm/s   --------- LV E/e&', medial              21.52     --------- LV e&',  average              4.19 cm/s   --------- LV E/e&', average             22.34     ---------  Ventricular septum            Value     Reference IVS thickness, ED             17.1 mm    ---------  LVOT                   Value     Reference LVOT ID, S                13  mm    --------- LVOT area                 1.33 cm^2   --------- LVOT peak velocity, S           147  cm/s   --------- LVOT mean velocity, S           99.4 cm/s   --------- LVOT VTI, S                30  cm    --------- LVOT peak gradient, S           9   mm Hg  ---------  Aortic valve               Value     Reference Aortic valve peak velocity, S       292  cm/s   --------- Aortic valve mean velocity, S       182  cm/s   --------- Aortic valve VTI, S            53.3 cm    --------- Aortic mean gradient, S          18  mm Hg  --------- Aortic peak gradient, S          34  mm Hg  --------- VTI ratio, LVOT/AV            0.56      --------- Aortic valve area, VTI          0.75 cm^2   --------- Aortic valve area/bsa, VTI        0.36 cm^2/m^2 --------- Velocity ratio, peak, LVOT/AV       0.5      --------- Aortic valve area, peak velocity     0.67 cm^2   --------- Aortic valve area/bsa, peak        0.32 cm^2/m^2 --------- velocity Velocity ratio, mean, LVOT/AV       0.55      --------- Aortic valve area, mean velocity     0.73 cm^2   --------- Aortic valve area/bsa, mean        0.35 cm^2/m^2 --------- velocity  Aorta                   Value     Reference Aortic root ID, ED            21  mm     ---------  Left atrium                Value     Reference LA ID, A-P, ES              42  mm    --------- LA ID/bsa, A-P              2   cm/m^2  <=2.2 LA volume, ES, 1-p A4C          101  ml    --------- LA volume/bsa, ES, 1-p A4C        48.1 ml/m^2  --------- LA volume, ES, 1-p A2C          77.1 ml    --------- LA volume/bsa, ES, 1-p A2C        36.7 ml/m^2  ---------  Mitral valve               Value     Reference Mitral E-wave peak velocity        93.6 cm/s   --------- Mitral A-wave peak velocity        124  cm/s   --------- Mitral deceleration time         211  ms    150 - 230 Mitral peak gradient, D          4   mm Hg  --------- Mitral E/A ratio, peak          0.8      ---------  Systemic veins              Value     Reference Estimated CVP               3   mm Hg  ---------  Right ventricle              Value     Reference TAPSE                   20.9 mm    --------- RV s&', lateral, S             10.6 cm/s   ---------  Legend: (L) and (H) mark values outside specified reference range.  ------------------------------------------------------------------- Prepared and Electronically Authenticated by  Willa Rough, MD 2016-06-08T11:48:33   CLINICAL DATA: 81 year old male with severe aortic stenosis. Preprocedural study prior to potential transcatheter aortic valve replacement (TAVR).  EXAM: CT ANGIOGRAPHY CHEST, ABDOMEN AND PELVIS  TECHNIQUE: Multidetector CT imaging through the chest, abdomen and pelvis was performed using the standard protocol during bolus administration of intravenous contrast. Multiplanar reconstructed images and MIPs were obtained and reviewed to  evaluate the vascular anatomy.  CONTRAST: 70mL OMNIPAQUE IOHEXOL 350 MG/ML SOLN  COMPARISON: CT of the abdomen and pelvis 12/22/2009.  FINDINGS: CTA CHEST FINDINGS  Mediastinum/Lymph Nodes: Heart size is borderline enlarged. There is no significant pericardial fluid, thickening or pericardial calcification. There is atherosclerosis of the thoracic aorta, the great vessels of the mediastinum and the coronary arteries, including calcified atherosclerotic plaque in the left main, left anterior descending, left circumflex and right coronary arteries. Severely calcified aortic valve. Mild calcifications of the mitral valve and annulus. No pathologically enlarged mediastinal or hilar lymph nodes. Esophagus is unremarkable in appearance. No axillary lymphadenopathy.  Lungs/Pleura: 3 mm subpleural nodule in the periphery of the right upper lobe (image 33 of series 403). No acute consolidative airspace disease. No pleural effusions. Mild scarring in the lung bases bilaterally.  Musculoskeletal/Soft Tissues: There  are no aggressive appearing lytic or blastic lesions noted in the visualized portions of the skeleton.  CTA ABDOMEN AND PELVIS FINDINGS  Hepatobiliary: No cystic or solid hepatic lesions. No intra or extrahepatic biliary ductal dilatation. High attenuation material lying dependently in the gallbladder may reflect tiny gallstones and/or biliary sludge. No current findings to suggest an acute cholecystitis at this time.  Pancreas: No pancreatic mass. No pancreatic ductal dilatation. No pancreatic or peripancreatic fluid or inflammatory changes.  Spleen: Unremarkable.  Adrenals/Urinary Tract: Bilateral adrenal glands are normal in appearance. Mild multifocal cortical thinning in the kidneys bilaterally. Several sub cm low-attenuation lesions are noted in the kidneys bilaterally, too small to definitively characterize, but statistically favored to represent tiny  cysts.  Stomach/Bowel: The appearance of the stomach is normal. No pathologic dilatation of small bowel or colon. A few scattered colonic diverticulae are noted, particularly in the sigmoid colon, without surrounding inflammatory changes to suggest an acute diverticulitis at this time.  Vascular/Lymphatic: Vascular findings and measurements pertinent to potential TAVR procedure, as detailed below. In addition, there is fusiform aneurysmal dilatation of the infrarenal abdominal aorta, which measures up to 5.1 x 4.6 cm. Two right renal arteries bilaterally.  Reproductive: Prostate gland and seminal vesicles are unremarkable in appearance.  Other: No significant volume of ascites. No pneumoperitoneum.  Musculoskeletal: There are no aggressive appearing lytic or blastic lesions noted in the visualized portions of the skeleton.  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  Right Common Femoral Artery -  Minimal Diameter - 9.4 x 9.2 mm  Tortuosity - mild  Calcification - mild  LEFT PELVIS:  Left Common Iliac Artery -  Minimal Diameter - 12.1 x 12.7 mm  Tortuosity - mild  Calcification - mild  Left External Iliac Artery -  Minimal Diameter - 8.5 x 9.0 mm  Tortuosity - moderate  Calcification - mild  Left Common Femoral Artery -  Minimal Diameter - 8.0 x 9.2 mm  Tortuosity - mild  Calcification - mild  Review of the MIP images confirms the above findings.  IMPRESSION: 1.  Vascular findings and measurements pertinent to potential TAVR procedure, as above. This patient does have suitable pelvic arterial access, on the left side. 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 2. In addition, there is fusiform aneurysmal dilatation of the infrarenal abdominal aorta which measures up to 5.1 x 4.6 cm. Recommend followup by abdomen and pelvis CTA in 3-6 months, and vascular surgery referral/consultation if not already obtained. This recommendation follows ACR consensus guidelines: White Paper of the ACR Incidental Findings Committee II on Vascular Findings. J Am Coll Radiol 2013; 10:789-794. 3. 3 mm subpleural nodule in the periphery of the right upper lobe is statistically most likely to represent a subpleural lymph node. If the patient is at high risk for bronchogenic carcinoma, follow-up chest CT at 1 year is recommended. If the patient is at low risk, no follow-up is needed.  This recommendation follows the consensus statement: Guidelines for Management of Small Pulmonary Nodules Detected on CT Scans: A Statement from the Fleischner Society as published in Radiology 2005; 237:395-400. 4. Mild colonic diverticulosis without findings to suggest acute diverticulitis at this time.   Electronically Signed  By: Trudie Reed M.D.  On: 05/28/2014 13:00  Impression:  He is doing well following TAVR. He feels much better and is getting back to normal activity. He does have a 5.1 cm infrarenal AAA that was noted on CTA. He had a CT in 2011 and it was noted to be 4.0 cm but not followed up. He will need vascular surgery evaluation.  Plan:  1. He will be referred to Dr. Durene Cal for evaluation and treatment of his AAA.  2. He has a follow up appt with Dr. Earmon Phoenix office.   Alleen Borne, MD [facility] [phone]

## 2014-08-07 ENCOUNTER — Encounter: Payer: Self-pay | Admitting: Surgery

## 2014-08-09 ENCOUNTER — Ambulatory Visit (INDEPENDENT_AMBULATORY_CARE_PROVIDER_SITE_OTHER): Payer: Medicare Other | Admitting: Surgery

## 2014-08-09 ENCOUNTER — Telehealth: Payer: Self-pay

## 2014-08-09 ENCOUNTER — Ambulatory Visit (HOSPITAL_COMMUNITY)
Admission: RE | Admit: 2014-08-09 | Discharge: 2014-08-09 | Disposition: A | Discharge status: Home / Self Care | Payer: Medicare Other | Source: Ambulatory Visit | Attending: Surgery | Admitting: Surgery

## 2014-08-09 ENCOUNTER — Encounter: Payer: Self-pay | Admitting: Surgery

## 2014-08-09 ENCOUNTER — Other Ambulatory Visit: Payer: Self-pay

## 2014-08-09 VITALS — BP 141/72 | HR 67 | Resp 16 | Ht 68.0 in | Wt 199.0 lb

## 2014-08-09 DIAGNOSIS — I714 Abdominal aortic aneurysm, without rupture, unspecified: Secondary | ICD-10-CM

## 2014-08-09 DIAGNOSIS — Z01810 Encounter for preprocedural cardiovascular examination: Secondary | ICD-10-CM | POA: Insufficient documentation

## 2014-08-09 NOTE — Addendum Note (Signed)
Addended by: Adria DillELDRIDGE-LEWIS, Ruperto Kiernan L on: 08/09/2014 01:41 PM   Modules accepted: Orders

## 2014-08-09 NOTE — Telephone Encounter (Signed)
Opened in error

## 2014-08-09 NOTE — Progress Notes (Signed)
Filed Vitals:   08/09/14 1249 08/09/14 1255  BP: 164/75 141/72  Pulse: 68 67  Resp: 16   Height: 5\' 8"  (1.727 m)   Weight: 199 lb (90.266 kg)

## 2014-08-09 NOTE — Progress Notes (Signed)
Patient name: Michael Wolfe MRN: [id number] DOB: 03-Feb-1931 Sex: male   Referred by: Dr. Laneta Simmers  Reason for referral:  Chief Complaint  Patient presents with  . New Evaluation    aaa referred by Dr Laneta Simmers    HISTORY OF PRESENT ILLNESS: This is an 81 year old gentleman who was found to have a 5.1 x 4.6 cm infrarenal abdominal aortic aneurysm on a recent CT scan.  Previously it was noted to be 4 cm in 2011.  The patient is asymptomatic and denies abdominal pain.  He has recently undergone transfemoral aortic valve replacement.  He has a history of coronary artery disease and has undergone stenting in the past.  He suffers and hyperlipidemia which is managed with a statin.  He is medically managed for hypertension.  He suffers from diabetes.  His hemoglobin A1c was 7.4 in June 2016.  He is a nonsmoker  Past Medical History  Diagnosis Date  . Hypertension   . Coronary artery disease   . Hyperlipidemia   . Severe aortic stenosis 05/06/2014  . Constipation   . Type II diabetes mellitus   . Osteoarthritis   . Arthritis     "left shoulder" (07/04/2014)  . Depression   . Kidney stones X 1  . Inferior myocardial infarction 2003    /notes 06/25/2014  . Pneumonia 1935; 1990's X 1    Past Surgical History  Procedure Laterality Date  . Coronary angiogram  09/12/2012    Procedure: CORONARY ANGIOGRAM;  Surgeon: Robynn Pane, MD;  Location: [facility] C[facility] LAB;  Service: Cardiovascular;;  . Left and right heart catheterization with coronary angiogram N/A 05/21/2014    Procedure: LEFT AND RIGHT HEART CATHETERIZATION WITH CORONARY ANGIOGRAM;  Surgeon: Rinaldo Cloud, MD;  Location[facility] LAB;  Service: Cardiovascular;  Laterality: N/A;  . Colonoscopy    . Lithotripsy    . Transcatheter aortic valve replacement, transfemoral N/A 07/02/2014    Procedure: TRANSCATHETER AORTIC VALVE REPLACEMENT, TRANSFEMORAL;  Surgeon: Tonny Bollman, MD;  Location: [facility] OR;  Service: Open Heart Surgery;  Laterality:  N/A;  TAVR-TF APPROACH ( VALVE)  . Tee without cardioversion N/A 07/02/2014    Procedure: TRANSESOPHAGEAL ECHOCARDIOGRAM (TEE);  Surgeon: Tonny Bollman, MD;  Location: [facility] OR;  Service: Open Heart Surgery;  Laterality: N/A;  . Cardiac valve replacement    . Cardiac catheterization  2003; 2016    Hattie Perch 06/25/2014  . Coronary angioplasty with stent placement  2009    /notes 06/25/2014    History   Social History  . Marital Status: Married    Spouse Name: N/A  . Number of Children: N/A  . Years of Education: N/A   Occupational History  . Not on file.   Social History Main Topics  . Smoking status: Never Smoker   . Smokeless tobacco: Never Used  . Alcohol Use: No  . Drug Use: No  . Sexual Activity: No   Other Topics Concern  . Not on file   Social History Narrative    Family History  Problem Relation Age of Onset  . Hypertension Father   . AAA (abdominal aortic aneurysm) Father   . Cancer Mother     Allergies as of 08/09/2014  . (No Known Allergies)    Current Outpatient Prescriptions on File Prior to Visit  Medication Sig Dispense Refill  . acetaminophen (TYLENOL) 500 MG tablet Take 500 mg by mouth every 6 (six) hours as needed for pain.    Marland Kitchen amLODipine (NORVASC) 5  MG tablet Take 5 mg by mouth 2 (two) times daily.    Marland Kitchen. aspirin 81 MG tablet Take 81 mg by mouth daily.    . clopidogrel (PLAVIX) 75 MG tablet Take 75 mg by mouth daily.    Marland Kitchen. docusate sodium (COLACE) 100 MG capsule Take 2 capsules (200 mg total) by mouth daily. 10 capsule 0  . famotidine (PEPCID) 20 MG tablet Take 1 tablet (20 mg total) by mouth 2 (two) times daily as needed for heartburn or indigestion.    . fish oil-omega-3 fatty acids 1000 MG capsule Take 3 g by mouth daily.     Marland Kitchen. glimepiride (AMARYL) 2 MG tablet Take 2 mg by mouth daily.     . metFORMIN (GLUCOPHAGE) 500 MG tablet Take 500-1,000 mg by mouth 2 (two) times daily with a meal. Takes 2 tablets in am and 1 tablet in pm    . metoprolol  succinate (TOPROL-XL) 25 MG 24 hr tablet Take 1 tablet (25 mg total) by mouth daily. Take with or immediately following a meal. 30 tablet 11  . OVER THE COUNTER MEDICATION Take 1-2 tablets by mouth at bedtime as needed. For sleep. Equate brand Nighttime Sleep Aid    . rosuvastatin (CRESTOR) 20 MG tablet Take 20 mg by mouth daily.     No current facility-administered medications on file prior to visit.     REVIEW OF SYSTEMS: Cardiovascular: Positive for chest pain, shortness of breath when lying flat, and leg swelling. Pulmonary: Positive for productive cough, negative for asthma or wheezing. Neurologic: No weakness, paresthesias, aphasia, or amaurosis. No dizziness. Hematologic: No bleeding problems or clotting disorders. Musculoskeletal: No joint pain or joint swelling. Gastrointestinal: No blood in stool or hematemesis Genitourinary: No dysuria or hematuria. Psychiatric:: No history of major depression. Integumentary: No rashes or ulcers. Constitutional: No fever or chills.  PHYSICAL EXAMINATION:  Filed Vitals:   08/09/14 1249  BP: 164/75  Pulse: 68  Resp: 16  Height: 5\' 8"  (1.727 m)  Weight: 199 lb (90.266 kg)   Body mass index is 30.26 kg/(m^2). General: The patient appears their stated age.   HEENT:  No gross abnormalities Pulmonary: Respirations are non-labored Abdomen: Soft and non-tender  Musculoskeletal: There are no major deformities.   Neurologic: No focal weakness or paresthesias are detected, Skin: There are no ulcer or rashes noted. Psychiatric: The patient has normal affect. Cardiovascular: There is a regular rate and rhythm without significant murmur appreciated.  No carotid bruits.  Palpable pedal pulses bilaterally Healed left longitudinal femoral incision  Diagnostic Studies: I have reviewed his CT angiogram.  The patient has a 5.1 cm infrarenal abdominal aortic aneurysm    Assessment:  Abdominal aortic aneurysm Plan: Treatment options were  reviewed with the patient.  We have elected to proceed with endovascular repair of his abdominal aortic aneurysm.  I discussed trying to do this percutaneously, however I may need to open his left femoral incision.  We discussed the risks and benefits of the operation which include but are not limited to death, cardiac pelvic complications, intestinal ischemia, lower extremity ischemia.  He wishes to proceed.  His operation has been scheduled for Thursday, July 28. I'm going to stop his Plavix 5 days prior to his operation.  I'm going to check preoperative carotid Doppler studies today.  There is not lab availability to get lower extremity Dopplers today to look for a popliteal aneurysm.  I will have this performed while he is in the hospital.  Eldridge Abrahams, M.D. [facility] Office: [phone] Pager:  [phone]

## 2014-08-12 ENCOUNTER — Other Ambulatory Visit: Payer: Self-pay | Admitting: Cardiovascular Disease

## 2014-08-12 ENCOUNTER — Ambulatory Visit (HOSPITAL_COMMUNITY): Payer: Medicare Other | Attending: Cardiology

## 2014-08-12 ENCOUNTER — Other Ambulatory Visit: Payer: Self-pay

## 2014-08-12 ENCOUNTER — Other Ambulatory Visit (HOSPITAL_COMMUNITY): Payer: Self-pay | Admitting: Cardiovascular Disease

## 2014-08-12 ENCOUNTER — Encounter: Payer: Self-pay | Admitting: Physician Assistant

## 2014-08-12 ENCOUNTER — Ambulatory Visit (INDEPENDENT_AMBULATORY_CARE_PROVIDER_SITE_OTHER): Payer: Medicare Other | Admitting: Physician Assistant

## 2014-08-12 VITALS — BP 120/58 | HR 59 | Ht 67.0 in | Wt 197.1 lb

## 2014-08-12 DIAGNOSIS — R001 Bradycardia, unspecified: Secondary | ICD-10-CM | POA: Insufficient documentation

## 2014-08-12 DIAGNOSIS — I350 Nonrheumatic aortic (valve) stenosis: Secondary | ICD-10-CM | POA: Diagnosis not present

## 2014-08-12 DIAGNOSIS — I25119 Atherosclerotic heart disease of native coronary artery with unspecified angina pectoris: Secondary | ICD-10-CM | POA: Diagnosis not present

## 2014-08-12 DIAGNOSIS — I714 Abdominal aortic aneurysm, without rupture, unspecified: Secondary | ICD-10-CM | POA: Insufficient documentation

## 2014-08-12 DIAGNOSIS — Z952 Presence of prosthetic heart valve: Secondary | ICD-10-CM | POA: Insufficient documentation

## 2014-08-12 DIAGNOSIS — Z954 Presence of other heart-valve replacement: Secondary | ICD-10-CM | POA: Diagnosis not present

## 2014-08-12 DIAGNOSIS — I2510 Atherosclerotic heart disease of native coronary artery without angina pectoris: Secondary | ICD-10-CM | POA: Insufficient documentation

## 2014-08-12 NOTE — Progress Notes (Signed)
Cardiology Office Note   Date:  08/12/2014   ID:  Michael Wolfe, DOB Mar 08, 1931, MRN [id number]  PCP:  Rinaldo Cloud, MD  Cardiologist: Dr. Sharyn Lull TAVR:Michael Excell Seltzer, M.D.  Chief Complaint:    History of Present Illness: Michael Wolfe is a 81 y.o. male who presents for post hospital follow-up after undergoing TAVR on 07/02/14 by Dr. Excell Seltzer and Dr. Laneta Simmers for severe symptomatic aortic stenosis. He had some bradycardia especially while sleeping but was tolerating low dose. Postop 2-D echo on 07/03/14 showed a mean gradient of 18 mmHg. He was discharged on Plavix and aspirin. He also has chronic diastolic heart failure NYHA class III, CAD, diabetes mellitus type 2.  CTA of the abdomen showed a 5.1 x 4.6 cm infrarenal AAA. He was referred to Dr. Durene Cal and is scheduled for surgery Thursday, July 28. They will need to hold his Plavix 5 days prior to surgery.  He overdid it one day working with an 8 lb sledge hammer and had chest pressure that eased with rest and a heating pad lasting about 1 hour. He was also mowing his lawn and within 3 days of the TAVR. He says he hasn't noticed a difference in his symptoms but his wife says she has noticed a big difference. She says he can do more than he used to be able to do without getting short of breath.    Past Medical History  Diagnosis Date  . Hypertension   . Coronary artery disease   . Hyperlipidemia   . Severe aortic stenosis 05/06/2014  . Constipation   . Type II diabetes mellitus   . Osteoarthritis   . Arthritis     "left shoulder" (07/04/2014)  . Depression   . Kidney stones X 1  . Inferior myocardial infarction 2003    /notes 06/25/2014  . Pneumonia 1935; 1990's X 1    Past Surgical History  Procedure Laterality Date  . Coronary angiogram  09/12/2012    Procedure: CORONARY ANGIOGRAM;  Surgeon: Robynn Pane, MD;  Location: [facility] CATH LAB;  Service: Cardiovascular;;  . Left and right heart catheterization with coronary  angiogram N/A 05/21/2014    Procedure: LEFT AND RIGHT HEART CATHETERIZATION WITH CORONARY ANGIOGRAM;  Surgeon: Rinaldo Cloud, MD;  Location: [facility] CATH LAB;  Service: Cardiovascular;  Laterality: N/A;  . Colonoscopy    . Lithotripsy    . Transcatheter aortic valve replacement, transfemoral N/A 07/02/2014    Procedure: TRANSCATHETER AORTIC VALVE REPLACEMENT, TRANSFEMORAL;  Surgeon: Tonny Bollman, MD;  Location: [facility] OR;  Service: Open Heart Surgery;  Laterality: N/A;  TAVR-TF APPROACH ( VALVE)  . Tee without cardioversion N/A 07/02/2014    Procedure: TRANSESOPHAGEAL ECHOCARDIOGRAM (TEE);  Surgeon: Tonny Bollman, MD;  Location: [facility] OR;  Service: Open Heart Surgery;  Laterality: N/A;  . Cardiac valve replacement    . Cardiac catheterization  2003; 2016    Hattie Perch 06/25/2014  . Coronary angioplasty with stent placement  2009    Hattie Perch 06/25/2014     Current Outpatient Prescriptions  Medication Sig Dispense Refill  . amLODipine (NORVASC) 5 MG tablet Take 5 mg by mouth 2 (two) times daily.    Marland Kitchen aspirin 81 MG tablet Take 81 mg by mouth daily.    . clopidogrel (PLAVIX) 75 MG tablet Take 75 mg by mouth daily.    . famotidine (PEPCID) 20 MG tablet Take 1 tablet (20 mg total) by mouth 2 (two) times daily as needed for heartburn or indigestion.    Marland Kitchen  fish oil-omega-3 fatty acids 1000 MG capsule Take 3 g by mouth daily.     Marland Kitchen. glimepiride (AMARYL) 2 MG tablet Take 2 mg by mouth daily.     Marland Kitchen. lisinopril (PRINIVIL,ZESTRIL) 40 MG tablet Take 40 mg by mouth daily.    . metFORMIN (GLUCOPHAGE) 500 MG tablet Take 500-1,000 mg by mouth 2 (two) times daily with a meal. Takes 2 tablets in am and 1 tablet in pm    . metoprolol succinate (TOPROL-XL) 25 MG 24 hr tablet Take 1 tablet (25 mg total) by mouth daily. Take with or immediately following a meal. 30 tablet 11  . OVER THE COUNTER MEDICATION Take 1-2 tablets by mouth at bedtime as needed. For sleep. Equate brand Nighttime Sleep Aid    . rosuvastatin (CRESTOR) 20 MG  tablet Take 20 mg by mouth daily.     No current facility-administered medications for this visit.    Allergies:   Review of patient's allergies indicates no known allergies.    Social History:  The patient  reports that he has never smoked. He has never used smokeless tobacco. He reports that he does not drink alcohol or use illicit drugs.   Family History:  The patient's    family history includes AAA (abdominal aortic aneurysm) in his father; Cancer in his mother; Hypertension in his father.    ROS:  Please see the history of present illness.   Otherwise, review of systems are positive for chest pain as described above, dyspnea on exertion, snoring, constipation, diarrhea, anxiety depression, balance issues, chronic muscle pain, easy bruising, headaches.   All other systems are reviewed and negative.    PHYSICAL EXAM: VS:  BP 120/58 mmHg  Pulse 59  Ht 5\' 7"  (1.702 m)  Wt 197 lb 1.9 oz (89.413 kg)  BMI 30.87 kg/m2 , BMI Body mass index is 30.87 kg/(m^2). GEN: Well nourished, well developed, in no acute distress Neck: no JVD, HJR, carotid bruits, or masses Cardiac:  RRR; 2/6 systolic murmur at the left sternal border, no gallop, rubs, thrill or heave,  Respiratory:  clear to auscultation bilaterally, normal work of breathing GI: soft, nontender, nondistended, + BS MS: no deformity or atrophy Extremities: Right and left groins at catheter insertion site stable without hematoma or hemorrhage good pulses bilaterally without cyanosis, clubbing, edema, good distal pulses bilaterally.  Skin: warm and dry, no rash Neuro:  Strength and sensation are intact    EKG:  EKG is ordered today. The ekg ordered today demonstrates sinus bradycardia at 59 bpm with first-degree AV block T-wave inversion laterally, unchanged from prior tracing Recent Labs: 06/28/2014: ALT 16* 07/03/2014: BUN 12; Creatinine, Ser 1.23; Hemoglobin 11.5*; Magnesium 1.6*; Platelets 121*; Potassium 4.0; Sodium 136     Lipid Panel    Component Value Date/Time   CHOL * 08/31/2007 0340    266        ATP III CLASSIFICATION:  <200     mg/dL   Desirable  [id number]  mg/dL   Borderline High  >=308>=240    mg/dL   High   TRIG [id number]* 84/69/629508/06/2007 0340   HDL 36* 08/31/2007 0340   CHOLHDL 7.4 08/31/2007 0340   VLDL UNABLE TO CALCULATE IF TRIGLYCERIDE OVER 400 mg/dL 28/41/324408/06/2007 01020340   LDLCALC  08/31/2007 0340    UNABLE TO CALCULATE IF TRIGLYCERIDE OVER 400 mg/dL        Total Cholesterol/HDL:CHD Risk Coronary Heart Disease Risk Table  Men   Women  1/2 Average Risk   3.4   3.3      Wt Readings from Last 3 Encounters:  08/12/14 197 lb 1.9 oz (89.413 kg)  08/09/14 199 lb (90.266 kg)  07/16/14 193 lb (87.544 kg)      Other studies Reviewed: Additional studies/ records that were reviewed today include and review of the records demonstrates:  Right and Left heart catheterization 05/21/14 FINDINGS:  Left main had 20% distal stenosis.  LAD has 20-25% ostial and 30-35% proximal edge stent restenosis, stented segment is widely patent. Diagonal 1 has 50-60% ostial stenosis followed by stented segment, which is widely patent.  Left circumflex has 10-15% proximal stenosis, stented segment is widely patent.  OM1 and OM2 were very, very small.  OM3 was large, which was patent.  Left circumflex tapers down in AV groove.  RCA has 30-35% mid and distal stenosis.  PDA is very very small.  PLV branch has 75-80% distal stenosis.  Distally, the vessel is very small beyond the lesion.  The patient tolerated the procedure well.  There were no complications.  Plan is to maximize antianginal medications.  The patient will be referred back to Heart Team for consideration for TAVR.         Eduardo Osier. Sharyn Lull, M.D.   FINDINGS:  Left main had 20% distal stenosis.  LAD has 20-25% ostial and 30-35% proximal edge stent restenosis, stented segment is widely patent. Diagonal 1 has 50-60% ostial stenosis followed by  stented segment, which is widely patent.  Left circumflex has 10-15% proximal stenosis, stented segment is widely patent.  OM1 and OM2 were very, very small.  OM3 was large, which was patent.  Left circumflex tapers down in AV groove.  RCA has 30-35% mid and distal stenosis.  PDA is very very small.  PLV branch has 75-80% distal stenosis.  Distally, the vessel is very small beyond the lesion.  The patient tolerated the procedure well.  There were no complications.  Plan is to maximize antianginal medications.  The patient will be referred back to Heart Team for consideration for TAVR.         Eduardo Osier. Sharyn Lull, M.D.  CTA 05/28/14   IMPRESSION: 1. Vascular findings and measurements pertinent to potential TAVR procedure, as above. This patient does have suitable pelvic arterial access, on the left side. 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 2. In addition, there is fusiform aneurysmal dilatation of the infrarenal abdominal aorta which measures up to 5.1 x 4.6 cm. Recommend followup by abdomen and pelvis CTA in 3-6 months, and vascular surgery referral/consultation if not already obtained. This recommendation follows ACR consensus guidelines: White Paper of the ACR Incidental Findings Committee II on Vascular Findings. J Am Coll Radiol 2013; 10:789-794. 3. 3 mm subpleural nodule in the periphery of the right upper lobe is statistically most likely to represent a subpleural lymph node. If the patient is at high risk for bronchogenic carcinoma, follow-up chest CT at 1 year is recommended. If the patient is at low risk, no follow-up is needed. This recommendation follows the consensus statement: Guidelines for Management of Small Pulmonary Nodules Detected on CT Scans: A Statement from the Fleischner Society as published in Radiology 2005; 237:395-400. 4. Mild colonic diverticulosis without findings  to suggest acute diverticulitis at this time.     Echo: 07/03/2014 Conclusions - Left ventricle: The cavity size was normal.  Wall thickness was   increased in a pattern of moderate LVH. The estimated ejection   fraction was 60%. Wall motion was normal; there were no regional   wall motion abnormalities. Findings consistent with left   ventricular diastolic dysfunction. Doppler parameters are   consistent with high ventricular filling pressure. - Aortic valve: Tissue prosthetic valve is working well. Mean   gradient (S): 18 mm Hg. Peak gradient (S): 34 mm Hg. - Left atrium: The atrium was mildly dilated. - Right ventricle: The cavity size was normal. Systolic function   was normal   ASSESSMENT AND PLAN:  Severe aortic stenosis Status post TAVR 07/02/14. Patient is feeling well. Preliminary 2-D echo performed today shows mean aortic valve gradient of 18-20 mmHg. Dr. Excell Seltzer to review and recommend follow-up. Long-term follow-up with Dr.Harwani  CAD (coronary artery disease) Recent cath by Dr. Sharyn Lull please see above dictation. Continue medical therapy. Patient did overdo it working with a sledgehammer and had some chest pain eases with rest. He did not use nitroglycerin. I asked him not to do any heavy lifting or strenuous activity until he has his aneurysm repaired and is cleared for such activity.  Abdominal aortic aneurysm Patient has a 5.1 x 4.6 AAA found on CTA of the abdomen. He is scheduled for surgery 08/22/14 and has to hold his Plavix 5 days prior to surgery. Continue aspirin.  Sinus bradycardia Patient is on low dose metoprolol. He is asymptomatic. His heart rate is 59 today and stable.    Elson Clan, PA-C  08/12/2014 12:59 PM    [facility] [address] [place], [place], [place]  [address] Phone: [phone]; Fax: [phone]

## 2014-08-12 NOTE — Assessment & Plan Note (Signed)
Recent cath by Dr. Sharyn LullHarwani please see above dictation. Continue medical therapy. Patient did overdo it working with a sledgehammer and had some chest pain eases with rest. He did not use nitroglycerin. I asked him not to do any heavy lifting or strenuous activity until he has his aneurysm repaired and is cleared for such activity.

## 2014-08-12 NOTE — Assessment & Plan Note (Signed)
Status post TAVR 07/02/14. Patient is feeling well. Preliminary 2-D echo performed today shows mean aortic valve gradient of 18-20 mmHg. Dr. Excell Seltzerooper to review and recommend follow-up. Long-term follow-up with Dr.Harwani

## 2014-08-12 NOTE — Assessment & Plan Note (Signed)
Patient is on low dose metoprolol. He is asymptomatic. His heart rate is 59 today and stable.

## 2014-08-12 NOTE — Assessment & Plan Note (Signed)
Patient has a 5.1 x 4.6 AAA found on CTA of the abdomen. He is scheduled for surgery 08/22/14 and has to hold his Plavix 5 days prior to surgery. Continue aspirin.

## 2014-08-12 NOTE — Patient Instructions (Addendum)
Medication Instructions:   - no change  Labwork: - none  Testing/Procedures: - none  Follow-Up: Your physician wants you to follow-up in: 1 YEAR with Dr Excell Seltzerooper. You will receive a reminder letter in the mail two months in advance. If you don't receive a letter, please call our office to schedule the follow-up appointment.  Your physician has requested that you have an echocardiogram in 1 YEAR. Echocardiography is a painless test that uses sound waves to create images of your heart. It provides your doctor with information about the size and shape of your heart and how well your heart's chambers and valves are working. This procedure takes approximately one hour. There are no restrictions for this procedure.  Your physician recommends that you schedule a follow-up appointment in: 2 MONTHS with Dr Sharyn LullHarwani      Any Other Special Instructions Will Be Listed Below (If Applicable).

## 2014-08-14 NOTE — Pre-Procedure Instructions (Signed)
Nadara ModeJames G Milks  08/14/2014      [facility] [address]- [place], [place] - [address] [place] [address] Phone: [phone] Fax: [phone]  [facility] [address]- [place], [place] - [address] [facility] [address] [place] [place] [address] Phone: [phone] Fax: [phone]    Your procedure is scheduled on Thursday, August 22, 2014  [facility].M.  Call this number if you have problems the morning of surgery:  [phone]   Remember: Follow doctors instructions regarding clopidogrel (PLAVIX)- stop 5 days prior to surgery  Do not eat food or drink liquids after midnight Wednesday, August 21, 2014  Take these medicines the morning of surgery with A SIP OF WATER :amLODipine (NORVASC), aspirin,  metoprolol succinate (TOPROL-XL), if needed:famotidine (PEPCID) for heartburn or indigestion. DO NOT take any diabetic medication the morning of procedure such as glimepiride (AMARYL) and metFORMIN (GLUCOPHAGE) Stop taking vitamins and herbal medications such as fish oil. Do not take any NSAIDs ie: Ibuprofen, Advil, Naproxen or any medication containing Aspirin; stop now.  Do not wear jewelry, make-up or nail polish.  Do not wear lotions, powders, or perfumes.  You may not wear deodorant.  Do not shave 48 hours prior to surgery.  Men may shave face and neck.  Do not bring valuables to the hospital.  [facility] is not responsible for any belongings or valuables.  Contacts, dentures or bridgework may not be worn into surgery.  Leave your suitcase in the car.  After surgery it may be brought to your room.  For patients admitted to the hospital, discharge time will be determined by your treatment team.  Patients discharged the day of surgery will not be allowed to drive home.    Special instructions:  Special Instructions:Special Instructions: [facility] - Preparing for  Surgery  Before surgery, you can play an important role.  Because skin is not sterile, your skin needs to be as free of germs as possible.  You can reduce the number of germs on you skin by washing with CHG (chlorahexidine gluconate) soap before surgery.  CHG is an antiseptic cleaner which kills germs and bonds with the skin to continue killing germs even after washing.  Please DO NOT use if you have an allergy to CHG or antibacterial soaps.  If your skin becomes reddened/irritated stop using the CHG and inform your nurse when you arrive at Short Stay.  Do not shave (including legs and underarms) for at least 48 hours prior to the first CHG shower.  You may shave your face.  Please follow these instructions carefully:   1.  Shower with CHG Soap the night before surgery and the morning of Surgery.  2.  If you choose to wash your hair, wash your hair first as usual with your normal shampoo.  3.  After you shampoo, rinse your hair and body thoroughly to remove the Shampoo.  4.  Use CHG as you would any other liquid soap.  You can apply chg directly  to the skin and wash gently with scrungie or a clean washcloth.  5.  Apply the CHG Soap to your body ONLY FROM THE NECK DOWN.  Do not use on open wounds or open sores.  Avoid contact with your eyes, ears, mouth and genitals (private parts).  Wash genitals (private parts) with your normal soap.  6.  Wash thoroughly,  paying special attention to the area where your surgery will be performed.  7.  Thoroughly rinse your body with warm water from the neck down.  8.  DO NOT shower/wash with your normal soap after using and rinsing off the CHG Soap.  9.  Pat yourself dry with a clean towel.            10.  Wear clean pajamas.            11.  Place clean sheets on your bed the night of your first shower and do not sleep with pets.  Day of Surgery  Do not apply any lotions/deodorants the morning of surgery.  Please wear clean clothes to the hospital/surgery  center.  Please read over the following fact sheets that you were given. Pain Booklet, Coughing and Deep Breathing, Blood Transfusion Information, MRSA Information and Surgical Site Infection Prevention

## 2014-08-15 ENCOUNTER — Encounter (HOSPITAL_COMMUNITY): Payer: Self-pay

## 2014-08-15 ENCOUNTER — Encounter (HOSPITAL_COMMUNITY)
Admission: RE | Admit: 2014-08-15 | Discharge: 2014-08-15 | Disposition: A | Discharge status: Home / Self Care | Payer: Medicare Other | Source: Ambulatory Visit | Attending: Surgery | Admitting: Surgery

## 2014-08-15 DIAGNOSIS — I2510 Atherosclerotic heart disease of native coronary artery without angina pectoris: Secondary | ICD-10-CM | POA: Insufficient documentation

## 2014-08-15 DIAGNOSIS — I350 Nonrheumatic aortic (valve) stenosis: Secondary | ICD-10-CM | POA: Diagnosis not present

## 2014-08-15 DIAGNOSIS — Z7982 Long term (current) use of aspirin: Secondary | ICD-10-CM | POA: Insufficient documentation

## 2014-08-15 DIAGNOSIS — E119 Type 2 diabetes mellitus without complications: Secondary | ICD-10-CM | POA: Insufficient documentation

## 2014-08-15 DIAGNOSIS — Z79899 Other long term (current) drug therapy: Secondary | ICD-10-CM | POA: Insufficient documentation

## 2014-08-15 DIAGNOSIS — Z01812 Encounter for preprocedural laboratory examination: Secondary | ICD-10-CM | POA: Insufficient documentation

## 2014-08-15 DIAGNOSIS — Z01818 Encounter for other preprocedural examination: Secondary | ICD-10-CM | POA: Diagnosis present

## 2014-08-15 DIAGNOSIS — I252 Old myocardial infarction: Secondary | ICD-10-CM | POA: Insufficient documentation

## 2014-08-15 DIAGNOSIS — Z0183 Encounter for blood typing: Secondary | ICD-10-CM | POA: Insufficient documentation

## 2014-08-15 DIAGNOSIS — Z7902 Long term (current) use of antithrombotics/antiplatelets: Secondary | ICD-10-CM | POA: Insufficient documentation

## 2014-08-15 DIAGNOSIS — K219 Gastro-esophageal reflux disease without esophagitis: Secondary | ICD-10-CM | POA: Insufficient documentation

## 2014-08-15 DIAGNOSIS — I714 Abdominal aortic aneurysm, without rupture: Secondary | ICD-10-CM | POA: Diagnosis not present

## 2014-08-15 DIAGNOSIS — I10 Essential (primary) hypertension: Secondary | ICD-10-CM | POA: Insufficient documentation

## 2014-08-15 HISTORY — DX: Gastro-esophageal reflux disease without esophagitis: K21.9

## 2014-08-15 HISTORY — DX: Reserved for inherently not codable concepts without codable children: IMO0001

## 2014-08-15 HISTORY — DX: Headache: R51

## 2014-08-15 HISTORY — DX: Angina pectoris, unspecified: I20.9

## 2014-08-15 HISTORY — DX: Personal history of other diseases of the digestive system: Z87.19

## 2014-08-15 HISTORY — DX: Headache, unspecified: R51.9

## 2014-08-15 LAB — CBC
HCT: 39.9 % (ref 39.0–52.0)
HEMOGLOBIN: 13.5 g/dL (ref 13.0–17.0)
MCH: 30.7 pg (ref 26.0–34.0)
MCHC: 33.8 g/dL (ref 30.0–36.0)
MCV: 90.7 fL (ref 78.0–100.0)
Platelets: 165 10*3/uL (ref 150–400)
RBC: 4.4 MIL/uL (ref 4.22–5.81)
RDW: 14.1 % (ref 11.5–15.5)
WBC: 8.7 10*3/uL (ref 4.0–10.5)

## 2014-08-15 LAB — TYPE AND SCREEN
ABO/RH(D): AB POS
ANTIBODY SCREEN: NEGATIVE

## 2014-08-15 LAB — URINALYSIS, ROUTINE W REFLEX MICROSCOPIC
Bilirubin Urine: NEGATIVE
GLUCOSE, UA: 250 mg/dL — AB
HGB URINE DIPSTICK: NEGATIVE
Ketones, ur: NEGATIVE mg/dL
LEUKOCYTES UA: NEGATIVE
NITRITE: NEGATIVE
Protein, ur: NEGATIVE mg/dL
Specific Gravity, Urine: 1.02 (ref 1.005–1.030)
Urobilinogen, UA: 0.2 mg/dL (ref 0.0–1.0)
pH: 5 (ref 5.0–8.0)

## 2014-08-15 LAB — COMPREHENSIVE METABOLIC PANEL
ALBUMIN: 3.9 g/dL (ref 3.5–5.0)
ALT: 13 U/L — AB (ref 17–63)
AST: 20 U/L (ref 15–41)
Alkaline Phosphatase: 65 U/L (ref 38–126)
Anion gap: 9 (ref 5–15)
BUN: 16 mg/dL (ref 6–20)
CALCIUM: 8.8 mg/dL — AB (ref 8.9–10.3)
CO2: 22 mmol/L (ref 22–32)
Chloride: 108 mmol/L (ref 101–111)
Creatinine, Ser: 1.28 mg/dL — ABNORMAL HIGH (ref 0.61–1.24)
GFR calc Af Amer: 59 mL/min — ABNORMAL LOW (ref >60)
GFR calc non Af Amer: 51 mL/min — ABNORMAL LOW (ref >60)
Glucose, Bld: 192 mg/dL — ABNORMAL HIGH (ref 65–99)
Potassium: 3.9 mmol/L (ref 3.5–5.1)
Sodium: 139 mmol/L (ref 135–145)
TOTAL PROTEIN: 6.8 g/dL (ref 6.5–8.1)
Total Bilirubin: 0.6 mg/dL (ref 0.3–1.2)

## 2014-08-15 LAB — SURGICAL PCR SCREEN
MRSA, PCR: NEGATIVE
STAPHYLOCOCCUS AUREUS: NEGATIVE

## 2014-08-15 LAB — BLOOD GAS, ARTERIAL
ACID-BASE DEFICIT: 0.8 mmol/L (ref 0.0–2.0)
BICARBONATE: 23.6 meq/L (ref 20.0–24.0)
DRAWN BY: 421801
FIO2: 0.21 %
O2 SAT: 98 %
PCO2 ART: 40.1 mmHg (ref 35.0–45.0)
Patient temperature: 98.6
TCO2: 24.8 mmol/L (ref 0–100)
pH, Arterial: 7.386 (ref 7.350–7.450)
pO2, Arterial: 111 mmHg — ABNORMAL HIGH (ref 80.0–100.0)

## 2014-08-15 LAB — PROTIME-INR
INR: 1.03 (ref 0.00–1.49)
Prothrombin Time: 13.7 seconds (ref 11.6–15.2)

## 2014-08-15 LAB — APTT: APTT: 30 s (ref 24–37)

## 2014-08-15 LAB — GLUCOSE, CAPILLARY: Glucose-Capillary: 182 mg/dL — ABNORMAL HIGH (ref 65–99)

## 2014-08-15 NOTE — Progress Notes (Addendum)
PCP and Cardiologist is Dr Sharyn Lull Reports that he doesn't check his blood sugar. CBG today was 182. Pt wife at side. Pt states he has stopped taking everything except his aspirin, Informed him to keep taking Toprol and blood sugar meds too. That he should only stop the plavix, and follow the instructions given to him today for the morning of surgery. Both he and his wife voice understanding of instructions.

## 2014-08-16 LAB — HEMOGLOBIN A1C
Hgb A1c MFr Bld: 7.5 % — ABNORMAL HIGH (ref 4.8–5.6)
MEAN PLASMA GLUCOSE: 169 mg/dL

## 2014-08-16 NOTE — Progress Notes (Addendum)
Anesthesia Chart Review: Patient is a 81 year old male scheduled for endovascular stent graft repair of AAA on 08/22/14 by Dr. Myra Gianotti.  History includes severe AS s/p TAVR(left transfemoral approach) 07/02/14, 5.1 cm AAA, bradycardia, DM2, HTN, CAD/inferior MI '03 and '09, depression, non-smoker, GERD, hiatal hernia, headache.   Primary cardiologist reported as Dr. Sharyn Lull. TAVR cardiologist is Dr. Excell Seltzer with [facility] with He was Geni Bers, PA-C on 08/12/14. He was instructed to hold Plavix for 5 days prior to surgery but continue ASA.   Meds include amlodipine, aspirin, Plavix, Pepcid, fish 00, Amaryl, lisinopril, metformin, Toprol-XL, Crestor.  08/12/14 Echo: Study Conclusions - Left ventricle: The cavity size was normal. There was moderate concentric hypertrophy. Systolic function was vigorous. The estimated ejection fraction was in the range of 65% to 70%. Wall motion was normal; there were no regional wall motion abnormalities. Doppler parameters are consistent with abnormal left ventricular relaxation (grade 1 diastolic dysfunction). Doppler parameters are consistent with elevated ventricular end-diastolic filling pressure. - Aortic root: The aortic root was normal in size. - Mitral valve: Structurally normal valve. - Left atrium: The atrium was mildly dilated. - Right ventricle: Systolic function was normal. - Right atrium: The atrium was normal in size. - Tricuspid valve: Structurally normal valve. There was no regurgitation. - Pulmonary arteries: Systolic pressure was within the normal range. Impressions: - S/P TAVR, bioprosthetic valve is functioning well. There is no AI or paravalvular leak. Transvalvular gradients are upper normal (mean 20 mmHg, max velocity 2.6 m/sec), but unchanged from the prior study.  05/21/14 LHC/RHC (pre-TAVR): FINDINGS: Left main had 20% distal stenosis. LAD has 20-25% ostial and 30-35% proximal edge stent  restenosis, stented segment is widely patent. Diagonal 1 has 50-60% ostial stenosis followed by stented segment, which is widely patent. Left circumflex has 10-15% proximal stenosis, stented segment is widely patent. OM1 and OM2 were very, very small. OM3 was large, which was patent. Left circumflex tapers down in AV groove. RCA has 30-35% mid and distal stenosis. PDA is very very small. PLV branch has 75-80% distal stenosis. Distally, the vessel is very small beyond the lesion. 08/12/14 EKG: SB with first degree AVB, LAD, LVH with QRS widening, T wave abnormality, consider lateral ischemia.  08/09/14 Carotid duplex: < 40% BICA stenosis, significant stenosis is observed in the right external carotid artery. Bilateral vertebral artery flow is antegrade.  07/03/14 1V CXR: IMPRESSION: 1. Interim removal Swan-Ganz catheter. Right IJ sheath in stable position. 2. Stable cardiomegaly. No pulmonary venous congestion. 3. Mild bibasilar subsegmental atelectasis.  05/28/14 PFTs: FVC 2.27 (64%, FEV1 1.87 (75%), DLCOunc 9.67 (34%).  Preoperative labs noted. A1C 7.5.   Patient with recent cardiac cath and s/p TAVR with recent follow-up. Now for EVAR AAA. If no acute changes then I anticipate that he can proceed as planned.  Velna Ochs [facility] Short Stay Center/Anesthesiology Phone [phone] 08/16/2014 12:51 PM

## 2014-08-21 MED ORDER — DEXTROSE 5 % IV SOLN
1.5000 g | INTRAVENOUS | Status: AC
  Administered 2014-08-22: 1.5 g via INTRAVENOUS
  Filled 2014-08-21: qty 1.5

## 2014-08-21 MED ORDER — SODIUM CHLORIDE 0.9 % IV SOLN: INTRAVENOUS | Status: DC

## 2014-08-21 NOTE — Progress Notes (Signed)
Notified patient's wife to arrive at 820 am .

## 2014-08-22 ENCOUNTER — Inpatient Hospital Stay (HOSPITAL_COMMUNITY): Payer: Medicare Other | Admitting: Certified Registered"

## 2014-08-22 ENCOUNTER — Inpatient Hospital Stay (HOSPITAL_COMMUNITY)
Admission: RE | Admit: 2014-08-22 | Discharge: 2014-08-25 | DRG: 269 | Disposition: A | Discharge status: Home / Self Care | Payer: Medicare Other | Source: Ambulatory Visit | Attending: Surgery | Admitting: Surgery

## 2014-08-22 ENCOUNTER — Inpatient Hospital Stay (HOSPITAL_COMMUNITY): Payer: Medicare Other

## 2014-08-22 ENCOUNTER — Encounter (HOSPITAL_COMMUNITY)
Admission: RE | Disposition: A | Discharge status: Home / Self Care | Payer: Self-pay | Source: Ambulatory Visit | Attending: Surgery

## 2014-08-22 ENCOUNTER — Inpatient Hospital Stay (HOSPITAL_COMMUNITY): Payer: Medicare Other | Admitting: Vascular Surgery

## 2014-08-22 DIAGNOSIS — I10 Essential (primary) hypertension: Secondary | ICD-10-CM | POA: Diagnosis present

## 2014-08-22 DIAGNOSIS — Z7982 Long term (current) use of aspirin: Secondary | ICD-10-CM | POA: Diagnosis not present

## 2014-08-22 DIAGNOSIS — E119 Type 2 diabetes mellitus without complications: Secondary | ICD-10-CM | POA: Diagnosis present

## 2014-08-22 DIAGNOSIS — I714 Abdominal aortic aneurysm, without rupture, unspecified: Secondary | ICD-10-CM

## 2014-08-22 DIAGNOSIS — R112 Nausea with vomiting, unspecified: Secondary | ICD-10-CM

## 2014-08-22 DIAGNOSIS — I252 Old myocardial infarction: Secondary | ICD-10-CM | POA: Diagnosis not present

## 2014-08-22 DIAGNOSIS — I2510 Atherosclerotic heart disease of native coronary artery without angina pectoris: Secondary | ICD-10-CM | POA: Diagnosis present

## 2014-08-22 DIAGNOSIS — Z8249 Family history of ischemic heart disease and other diseases of the circulatory system: Secondary | ICD-10-CM

## 2014-08-22 DIAGNOSIS — Z79899 Other long term (current) drug therapy: Secondary | ICD-10-CM

## 2014-08-22 DIAGNOSIS — I708 Atherosclerosis of other arteries: Secondary | ICD-10-CM | POA: Diagnosis present

## 2014-08-22 DIAGNOSIS — E785 Hyperlipidemia, unspecified: Secondary | ICD-10-CM | POA: Diagnosis present

## 2014-08-22 DIAGNOSIS — Z9889 Other specified postprocedural states: Secondary | ICD-10-CM

## 2014-08-22 DIAGNOSIS — I350 Nonrheumatic aortic (valve) stenosis: Secondary | ICD-10-CM | POA: Diagnosis present

## 2014-08-22 DIAGNOSIS — K219 Gastro-esophageal reflux disease without esophagitis: Secondary | ICD-10-CM | POA: Diagnosis present

## 2014-08-22 DIAGNOSIS — Z7902 Long term (current) use of antithrombotics/antiplatelets: Secondary | ICD-10-CM

## 2014-08-22 DIAGNOSIS — Z8679 Personal history of other diseases of the circulatory system: Secondary | ICD-10-CM

## 2014-08-22 DIAGNOSIS — Z955 Presence of coronary angioplasty implant and graft: Secondary | ICD-10-CM

## 2014-08-22 DIAGNOSIS — Z952 Presence of prosthetic heart valve: Secondary | ICD-10-CM | POA: Diagnosis not present

## 2014-08-22 DIAGNOSIS — Z95828 Presence of other vascular implants and grafts: Secondary | ICD-10-CM

## 2014-08-22 HISTORY — PX: ABDOMINAL AORTIC ENDOVASCULAR STENT GRAFT: SHX5707

## 2014-08-22 LAB — PROTIME-INR
INR: 1.24 (ref 0.00–1.49)
Prothrombin Time: 15.7 seconds — ABNORMAL HIGH (ref 11.6–15.2)

## 2014-08-22 LAB — CBC
HEMATOCRIT: 32.5 % — AB (ref 39.0–52.0)
Hemoglobin: 11.1 g/dL — ABNORMAL LOW (ref 13.0–17.0)
MCH: 30.7 pg (ref 26.0–34.0)
MCHC: 34.2 g/dL (ref 30.0–36.0)
MCV: 90 fL (ref 78.0–100.0)
Platelets: 125 10*3/uL — ABNORMAL LOW (ref 150–400)
RBC: 3.61 MIL/uL — ABNORMAL LOW (ref 4.22–5.81)
RDW: 14 % (ref 11.5–15.5)
WBC: 8.3 10*3/uL (ref 4.0–10.5)

## 2014-08-22 LAB — GLUCOSE, CAPILLARY
GLUCOSE-CAPILLARY: 169 mg/dL — AB (ref 65–99)
Glucose-Capillary: 165 mg/dL — ABNORMAL HIGH (ref 65–99)
Glucose-Capillary: 180 mg/dL — ABNORMAL HIGH (ref 65–99)

## 2014-08-22 LAB — BASIC METABOLIC PANEL
Anion gap: 6 (ref 5–15)
BUN: 19 mg/dL (ref 6–20)
CO2: 23 mmol/L (ref 22–32)
Calcium: 8.5 mg/dL — ABNORMAL LOW (ref 8.9–10.3)
Chloride: 109 mmol/L (ref 101–111)
Creatinine, Ser: 1.33 mg/dL — ABNORMAL HIGH (ref 0.61–1.24)
GFR calc Af Amer: 57 mL/min — ABNORMAL LOW (ref >60)
GFR calc non Af Amer: 49 mL/min — ABNORMAL LOW (ref >60)
GLUCOSE: 199 mg/dL — AB (ref 65–99)
Potassium: 4.2 mmol/L (ref 3.5–5.1)
SODIUM: 138 mmol/L (ref 135–145)

## 2014-08-22 LAB — MAGNESIUM: MAGNESIUM: 1.7 mg/dL (ref 1.7–2.4)

## 2014-08-22 LAB — APTT: APTT: 36 s (ref 24–37)

## 2014-08-22 SURGERY — INSERTION, ENDOVASCULAR STENT GRAFT, AORTA, ABDOMINAL
Anesthesia: General | Site: Abdomen

## 2014-08-22 MED ORDER — LIDOCAINE HCL (CARDIAC) 20 MG/ML IV SOLN
INTRAVENOUS | Status: DC | PRN
  Administered 2014-08-22: 80 mg via INTRAVENOUS

## 2014-08-22 MED ORDER — HEMOSTATIC AGENTS (NO CHARGE) OPTIME
TOPICAL | Status: DC | PRN
  Administered 2014-08-22: 1 via TOPICAL

## 2014-08-22 MED ORDER — LACTATED RINGERS IV SOLN
INTRAVENOUS | Status: DC
  Administered 2014-08-22: 09:00:00 via INTRAVENOUS

## 2014-08-22 MED ORDER — SODIUM CHLORIDE 0.9 % IR SOLN
Status: DC | PRN
  Administered 2014-08-22 (×2): 500 mL

## 2014-08-22 MED ORDER — SUFENTANIL CITRATE 50 MCG/ML IV SOLN
INTRAVENOUS | Status: DC | PRN
  Administered 2014-08-22: 20 ug via INTRAVENOUS
  Administered 2014-08-22: 5 ug via INTRAVENOUS

## 2014-08-22 MED ORDER — ACETAMINOPHEN 650 MG RE SUPP
325.0000 mg | RECTAL | Status: DC | PRN
  Administered 2014-08-24: 650 mg via RECTAL
  Filled 2014-08-22: qty 1

## 2014-08-22 MED ORDER — SODIUM CHLORIDE 0.9 % IJ SOLN
INTRAMUSCULAR | Status: AC
  Filled 2014-08-22: qty 20

## 2014-08-22 MED ORDER — SODIUM CHLORIDE 0.9 % IV SOLN: 500.0000 mL | Freq: Once | INTRAVENOUS | Status: AC | PRN

## 2014-08-22 MED ORDER — HYDRALAZINE HCL 20 MG/ML IJ SOLN: 5.0000 mg | INTRAMUSCULAR | Status: DC | PRN

## 2014-08-22 MED ORDER — HEPARIN SODIUM (PORCINE) 1000 UNIT/ML IJ SOLN
INTRAMUSCULAR | Status: AC
  Filled 2014-08-22: qty 1

## 2014-08-22 MED ORDER — METOPROLOL TARTRATE 1 MG/ML IV SOLN
2.0000 mg | INTRAVENOUS | Status: DC | PRN
Start: 2014-08-22 — End: 2014-08-25

## 2014-08-22 MED ORDER — NEOSTIGMINE METHYLSULFATE 10 MG/10ML IV SOLN
INTRAVENOUS | Status: AC
  Filled 2014-08-22: qty 1

## 2014-08-22 MED ORDER — CHLORHEXIDINE GLUCONATE 4 % EX LIQD
60.0000 mL | Freq: Once | CUTANEOUS | Status: DC
Start: 2014-08-22 — End: 2014-08-22

## 2014-08-22 MED ORDER — AMLODIPINE BESYLATE 5 MG PO TABS
5.0000 mg | ORAL_TABLET | Freq: Two times a day (BID) | ORAL | Status: DC
  Administered 2014-08-22 – 2014-08-25 (×6): 5 mg via ORAL
  Filled 2014-08-22 (×7): qty 1

## 2014-08-22 MED ORDER — ROSUVASTATIN CALCIUM 20 MG PO TABS
20.0000 mg | ORAL_TABLET | Freq: Every day | ORAL | Status: DC
  Administered 2014-08-23 – 2014-08-25 (×3): 20 mg via ORAL
  Filled 2014-08-22 (×3): qty 1

## 2014-08-22 MED ORDER — GLYCOPYRROLATE 0.2 MG/ML IJ SOLN
INTRAMUSCULAR | Status: DC | PRN
  Administered 2014-08-22: 0.4 mg via INTRAVENOUS

## 2014-08-22 MED ORDER — PROPOFOL 10 MG/ML IV BOLUS
INTRAVENOUS | Status: AC
  Filled 2014-08-22: qty 20

## 2014-08-22 MED ORDER — SODIUM CHLORIDE 0.9 % IV SOLN
INTRAVENOUS | Status: DC
  Administered 2014-08-24: 1000 mL via INTRAVENOUS
  Administered 2014-08-24 – 2014-08-25 (×2): via INTRAVENOUS

## 2014-08-22 MED ORDER — OXYCODONE HCL 5 MG PO TABS
5.0000 mg | ORAL_TABLET | ORAL | Status: DC | PRN
  Administered 2014-08-22 – 2014-08-24 (×5): 5 mg via ORAL
  Filled 2014-08-22 (×5): qty 1

## 2014-08-22 MED ORDER — PROTAMINE SULFATE 10 MG/ML IV SOLN
INTRAVENOUS | Status: AC
  Filled 2014-08-22: qty 5

## 2014-08-22 MED ORDER — OMEGA-3-ACID ETHYL ESTERS 1 G PO CAPS
3.0000 g | ORAL_CAPSULE | Freq: Every day | ORAL | Status: DC
  Administered 2014-08-23 – 2014-08-25 (×3): 3 g via ORAL
  Filled 2014-08-22 (×4): qty 3

## 2014-08-22 MED ORDER — INSULIN ASPART 100 UNIT/ML ~~LOC~~ SOLN
0.0000 [IU] | Freq: Three times a day (TID) | SUBCUTANEOUS | Status: DC
  Administered 2014-08-23: 5 [IU] via SUBCUTANEOUS
  Administered 2014-08-23: 3 [IU] via SUBCUTANEOUS
  Administered 2014-08-23: 5 [IU] via SUBCUTANEOUS
  Administered 2014-08-24: 3 [IU] via SUBCUTANEOUS
  Administered 2014-08-24 (×2): 2 [IU] via SUBCUTANEOUS

## 2014-08-22 MED ORDER — GLIMEPIRIDE 2 MG PO TABS
2.0000 mg | ORAL_TABLET | Freq: Every day | ORAL | Status: DC
  Administered 2014-08-23 – 2014-08-25 (×2): 2 mg via ORAL
  Filled 2014-08-22 (×4): qty 1

## 2014-08-22 MED ORDER — MORPHINE SULFATE 2 MG/ML IJ SOLN: 2.0000 mg | INTRAMUSCULAR | Status: DC | PRN

## 2014-08-22 MED ORDER — FAMOTIDINE 20 MG PO TABS: 20.0000 mg | ORAL_TABLET | Freq: Two times a day (BID) | ORAL | Status: DC | PRN

## 2014-08-22 MED ORDER — DOCUSATE SODIUM 100 MG PO CAPS
100.0000 mg | ORAL_CAPSULE | Freq: Every day | ORAL | Status: DC
  Administered 2014-08-23 – 2014-08-25 (×3): 100 mg via ORAL
  Filled 2014-08-22 (×4): qty 1

## 2014-08-22 MED ORDER — PROTAMINE SULFATE 10 MG/ML IV SOLN
INTRAVENOUS | Status: DC | PRN
  Administered 2014-08-22 (×2): 20 mg via INTRAVENOUS
  Administered 2014-08-22: 10 mg via INTRAVENOUS

## 2014-08-22 MED ORDER — PHENOL 1.4 % MT LIQD: 1.0000 | OROMUCOSAL | Status: DC | PRN

## 2014-08-22 MED ORDER — PROPOFOL 10 MG/ML IV BOLUS
INTRAVENOUS | Status: DC | PRN
  Administered 2014-08-22: 80 mg via INTRAVENOUS

## 2014-08-22 MED ORDER — IODIXANOL 320 MG/ML IV SOLN
INTRAVENOUS | Status: DC | PRN
  Administered 2014-08-22: 55.66 mL via INTRAVENOUS

## 2014-08-22 MED ORDER — GUAIFENESIN-DM 100-10 MG/5ML PO SYRP
15.0000 mL | ORAL_SOLUTION | ORAL | Status: DC | PRN
  Administered 2014-08-25: 15 mL via ORAL
  Filled 2014-08-22: qty 15

## 2014-08-22 MED ORDER — ONDANSETRON HCL 4 MG/2ML IJ SOLN
INTRAMUSCULAR | Status: DC | PRN
  Administered 2014-08-22: 4 mg via INTRAVENOUS

## 2014-08-22 MED ORDER — HEPARIN SODIUM (PORCINE) 1000 UNIT/ML IJ SOLN
INTRAMUSCULAR | Status: DC | PRN
  Administered 2014-08-22: 1000 [IU] via INTRAVENOUS
  Administered 2014-08-22: 9000 [IU] via INTRAVENOUS

## 2014-08-22 MED ORDER — METOPROLOL SUCCINATE ER 25 MG PO TB24
25.0000 mg | ORAL_TABLET | Freq: Every day | ORAL | Status: DC
  Administered 2014-08-22: 25 mg via ORAL
  Filled 2014-08-22: qty 1

## 2014-08-22 MED ORDER — PHENYLEPHRINE HCL 10 MG/ML IJ SOLN
INTRAMUSCULAR | Status: DC | PRN
  Administered 2014-08-22: 40 ug via INTRAVENOUS

## 2014-08-22 MED ORDER — ROCURONIUM BROMIDE 100 MG/10ML IV SOLN
INTRAVENOUS | Status: DC | PRN
  Administered 2014-08-22: 40 mg via INTRAVENOUS

## 2014-08-22 MED ORDER — ONDANSETRON HCL 4 MG/2ML IJ SOLN
INTRAMUSCULAR | Status: AC
  Filled 2014-08-22: qty 2

## 2014-08-22 MED ORDER — METOPROLOL SUCCINATE ER 25 MG PO TB24: 25.0000 mg | ORAL_TABLET | Freq: Every day | ORAL | Status: DC

## 2014-08-22 MED ORDER — FENTANYL CITRATE (PF) 100 MCG/2ML IJ SOLN
INTRAMUSCULAR | Status: AC
  Filled 2014-08-22: qty 2

## 2014-08-22 MED ORDER — CLOPIDOGREL BISULFATE 75 MG PO TABS
75.0000 mg | ORAL_TABLET | Freq: Every day | ORAL | Status: DC
  Administered 2014-08-23 – 2014-08-25 (×3): 75 mg via ORAL
  Filled 2014-08-22 (×3): qty 1

## 2014-08-22 MED ORDER — POTASSIUM CHLORIDE CRYS ER 20 MEQ PO TBCR: 20.0000 meq | EXTENDED_RELEASE_TABLET | Freq: Every day | ORAL | Status: DC | PRN

## 2014-08-22 MED ORDER — FENTANYL CITRATE (PF) 100 MCG/2ML IJ SOLN
25.0000 ug | INTRAMUSCULAR | Status: DC | PRN
  Administered 2014-08-22: 50 ug via INTRAVENOUS

## 2014-08-22 MED ORDER — ACETAMINOPHEN 325 MG PO TABS
325.0000 mg | ORAL_TABLET | ORAL | Status: DC | PRN
  Filled 2014-08-22: qty 2

## 2014-08-22 MED ORDER — ALUM & MAG HYDROXIDE-SIMETH 200-200-20 MG/5ML PO SUSP: 15.0000 mL | ORAL | Status: DC | PRN

## 2014-08-22 MED ORDER — LISINOPRIL 40 MG PO TABS
40.0000 mg | ORAL_TABLET | Freq: Every day | ORAL | Status: DC
  Administered 2014-08-23 – 2014-08-25 (×3): 40 mg via ORAL
  Filled 2014-08-22 (×4): qty 1

## 2014-08-22 MED ORDER — GLYCOPYRROLATE 0.2 MG/ML IJ SOLN
INTRAMUSCULAR | Status: AC
  Filled 2014-08-22: qty 2

## 2014-08-22 MED ORDER — LACTATED RINGERS IV SOLN
INTRAVENOUS | Status: DC | PRN
  Administered 2014-08-22: 09:00:00 via INTRAVENOUS

## 2014-08-22 MED ORDER — METOPROLOL SUCCINATE ER 25 MG PO TB24
25.0000 mg | ORAL_TABLET | Freq: Every day | ORAL | Status: DC
  Administered 2014-08-23 – 2014-08-25 (×2): 25 mg via ORAL
  Filled 2014-08-22 (×5): qty 1

## 2014-08-22 MED ORDER — ACETAMINOPHEN 160 MG/5ML PO SOLN
325.0000 mg | ORAL | Status: DC | PRN
  Filled 2014-08-22: qty 20.3

## 2014-08-22 MED ORDER — PHENYLEPHRINE HCL 10 MG/ML IJ SOLN
10.0000 mg | INTRAVENOUS | Status: DC | PRN
  Administered 2014-08-22: 10 ug/min via INTRAVENOUS

## 2014-08-22 MED ORDER — SUFENTANIL CITRATE 50 MCG/ML IV SOLN
INTRAVENOUS | Status: AC
  Filled 2014-08-22: qty 1

## 2014-08-22 MED ORDER — ROCURONIUM BROMIDE 50 MG/5ML IV SOLN
INTRAVENOUS | Status: AC
  Filled 2014-08-22: qty 1

## 2014-08-22 MED ORDER — 0.9 % SODIUM CHLORIDE (POUR BTL) OPTIME
TOPICAL | Status: DC | PRN
  Administered 2014-08-22: 1000 mL

## 2014-08-22 MED ORDER — NEOSTIGMINE METHYLSULFATE 10 MG/10ML IV SOLN
INTRAVENOUS | Status: DC | PRN
  Administered 2014-08-22: 3 mg via INTRAVENOUS

## 2014-08-22 MED ORDER — ACETAMINOPHEN 325 MG PO TABS: 325.0000 mg | ORAL_TABLET | ORAL | Status: DC | PRN

## 2014-08-22 MED ORDER — ASPIRIN 81 MG PO CHEW
81.0000 mg | CHEWABLE_TABLET | Freq: Every day | ORAL | Status: DC
  Administered 2014-08-23 – 2014-08-25 (×3): 81 mg via ORAL
  Filled 2014-08-22 (×2): qty 1

## 2014-08-22 MED ORDER — CHLORHEXIDINE GLUCONATE 4 % EX LIQD: 60.0000 mL | Freq: Once | CUTANEOUS | Status: DC

## 2014-08-22 MED ORDER — ONDANSETRON HCL 4 MG/2ML IJ SOLN
4.0000 mg | Freq: Four times a day (QID) | INTRAMUSCULAR | Status: DC | PRN
  Administered 2014-08-24 – 2014-08-25 (×2): 4 mg via INTRAVENOUS
  Filled 2014-08-22 (×2): qty 2

## 2014-08-22 MED ORDER — BISACODYL 10 MG RE SUPP: 10.0000 mg | Freq: Every day | RECTAL | Status: DC | PRN

## 2014-08-22 MED ORDER — LABETALOL HCL 5 MG/ML IV SOLN
10.0000 mg | INTRAVENOUS | Status: DC | PRN
  Filled 2014-08-22: qty 4

## 2014-08-22 MED ORDER — LABETALOL HCL 5 MG/ML IV SOLN
INTRAVENOUS | Status: DC | PRN
  Administered 2014-08-22: 10 mg via INTRAVENOUS

## 2014-08-22 MED ORDER — DEXTROSE 5 % IV SOLN
1.5000 g | Freq: Two times a day (BID) | INTRAVENOUS | Status: AC
  Administered 2014-08-22 – 2014-08-23 (×2): 1.5 g via INTRAVENOUS
  Filled 2014-08-22 (×2): qty 1.5

## 2014-08-22 SURGICAL SUPPLY — 76 items
BAG DECANTER FOR FLEXI CONT (MISCELLANEOUS) ×1 IMPLANT
BLADE SURG CLIPPER 3M 9600 (MISCELLANEOUS) ×2 IMPLANT
CANISTER SUCTION 2500CC (MISCELLANEOUS) ×2 IMPLANT
CATH ANGIO 5F BER2 65CM (CATHETERS) ×1 IMPLANT
CATH BEACON 5.038 65CM KMP-01 (CATHETERS) IMPLANT
CATH OMNI FLUSH .035X70CM (CATHETERS) ×1 IMPLANT
CLIP TI MEDIUM 24 (CLIP) ×1 IMPLANT
CLIP TI WIDE RED SMALL 24 (CLIP) ×1 IMPLANT
COVER PROBE W GEL 5X96 (DRAPES) ×1 IMPLANT
DEVICE CLOSURE PERCLS PRGLD 6F (VASCULAR PRODUCTS) IMPLANT
DRAPE ZERO GRAVITY STERILE (DRAPES) ×2 IMPLANT
DRSG TEGADERM 2-3/8X2-3/4 SM (GAUZE/BANDAGES/DRESSINGS) ×2 IMPLANT
DRYSEAL FLEXSHEATH 12FR 33CM (SHEATH) ×1
DRYSEAL FLEXSHEATH 16FR 33CM (SHEATH) ×1
ELECT CAUTERY BLADE 6.4 (BLADE) ×1 IMPLANT
ELECT REM PT RETURN 9FT ADLT (ELECTROSURGICAL) ×4
ELECTRODE REM PT RTRN 9FT ADLT (ELECTROSURGICAL) ×2 IMPLANT
EXCLUDER TNK 26X14.5MMX12CM (Endovascular Graft) IMPLANT
EXCLUDER TRUNK 26X14.5MMX12CM (Endovascular Graft) ×2 IMPLANT
GAUZE SPONGE 2X2 8PLY STRL LF (GAUZE/BANDAGES/DRESSINGS) ×2 IMPLANT
GLOVE BIO SURGEON STRL SZ7 (GLOVE) ×1 IMPLANT
GLOVE BIO SURGEON STRL SZ7.5 (GLOVE) ×1 IMPLANT
GLOVE BIOGEL PI IND STRL 6.5 (GLOVE) IMPLANT
GLOVE BIOGEL PI IND STRL 7.5 (GLOVE) ×1 IMPLANT
GLOVE BIOGEL PI IND STRL 8 (GLOVE) IMPLANT
GLOVE BIOGEL PI INDICATOR 6.5 (GLOVE) ×1
GLOVE BIOGEL PI INDICATOR 7.5 (GLOVE) ×2
GLOVE BIOGEL PI INDICATOR 8 (GLOVE) ×1
GLOVE SS BIOGEL STRL SZ 6.5 (GLOVE) IMPLANT
GLOVE SUPERSENSE BIOGEL SZ 6.5 (GLOVE) ×1
GLOVE SURG SS PI 7.5 STRL IVOR (GLOVE) ×2 IMPLANT
GOWN STRL REUS W/ TWL LRG LVL3 (GOWN DISPOSABLE) ×2 IMPLANT
GOWN STRL REUS W/ TWL XL LVL3 (GOWN DISPOSABLE) ×1 IMPLANT
GOWN STRL REUS W/TWL LRG LVL3 (GOWN DISPOSABLE) ×4
GOWN STRL REUS W/TWL XL LVL3 (GOWN DISPOSABLE) ×2
GRAFT BALLN CATH 65CM (STENTS) IMPLANT
HEMOSTAT SNOW SURGICEL 2X4 (HEMOSTASIS) IMPLANT
KIT BASIN OR (CUSTOM PROCEDURE TRAY) ×2 IMPLANT
KIT ROOM TURNOVER OR (KITS) ×2 IMPLANT
LEG CONTRALATERAL 16X16X13.5 (Endovascular Graft) ×2 IMPLANT
LEG CONTRALATERAL 16X16X9.5 (Endovascular Graft) ×1 IMPLANT
LIQUID BAND (GAUZE/BANDAGES/DRESSINGS) ×3 IMPLANT
LOOP VESSEL MAXI BLUE (MISCELLANEOUS) ×3 IMPLANT
NDL PERC 18GX7CM (NEEDLE) ×1 IMPLANT
NEEDLE PERC 18GX7CM (NEEDLE) ×4 IMPLANT
NS IRRIG 1000ML POUR BTL (IV SOLUTION) ×2 IMPLANT
PACK ENDOVASCULAR (PACKS) ×2 IMPLANT
PAD ARMBOARD 7.5X6 YLW CONV (MISCELLANEOUS) ×4 IMPLANT
PENCIL BUTTON HOLSTER BLD 10FT (ELECTRODE) ×1 IMPLANT
PERCLOSE PROGLIDE 6F (VASCULAR PRODUCTS) ×8
SHEATH AVANTI 11CM 8FR (MISCELLANEOUS) ×1 IMPLANT
SHEATH BRITE TIP 8FR 23CM (MISCELLANEOUS) ×1 IMPLANT
SHEATH DRYSEAL FLEX 12FR 33CM (SHEATH) IMPLANT
SHEATH DRYSEAL FLEX 16FR 33CM (SHEATH) IMPLANT
SHIELD RADPAD SCOOP 12X17 (MISCELLANEOUS) ×1 IMPLANT
SPONGE GAUZE 2X2 STER 10/PKG (GAUZE/BANDAGES/DRESSINGS) ×2
SPONGE INTESTINAL PEANUT (DISPOSABLE) ×1 IMPLANT
SPONGE LAP 18X18 X RAY DECT (DISPOSABLE) ×1 IMPLANT
STENT GRAFT BALLN CATH 65CM (STENTS) ×1
STENT GRAFT CONTRALAT 16X13.5 (Endovascular Graft) IMPLANT
STOPCOCK MORSE 400PSI 3WAY (MISCELLANEOUS) ×2 IMPLANT
SUT ETHILON 3 0 PS 1 (SUTURE) IMPLANT
SUT PROLENE 5 0 C 1 24 (SUTURE) ×3 IMPLANT
SUT PROLENE 6 0 BV (SUTURE) ×1 IMPLANT
SUT VIC AB 2-0 CT1 27 (SUTURE) ×2
SUT VIC AB 2-0 CT1 TAPERPNT 27 (SUTURE) IMPLANT
SUT VIC AB 2-0 CTX 36 (SUTURE) ×1 IMPLANT
SUT VIC AB 3-0 SH 27 (SUTURE) ×2
SUT VIC AB 3-0 SH 27X BRD (SUTURE) IMPLANT
SUT VICRYL 4-0 PS2 18IN ABS (SUTURE) ×4 IMPLANT
SYR 30ML LL (SYRINGE) ×2 IMPLANT
SYR BULB IRRIGATION 50ML (SYRINGE) ×1 IMPLANT
TRAY FOLEY W/METER SILVER 16FR (SET/KITS/TRAYS/PACK) ×2 IMPLANT
TUBING HIGH PRESSURE 120CM (CONNECTOR) ×2 IMPLANT
WIRE AMPLATZ SS-J .035X180CM (WIRE) ×2 IMPLANT
WIRE BENTSON .035X145CM (WIRE) ×2 IMPLANT

## 2014-08-22 NOTE — H&P (View-Only) (Signed)
   Patient name: Michael Wolfe MRN: [id number] DOB: 09/13/1931 Sex: male   Referred by: Dr. Bartle  Reason for referral:  Chief Complaint  Patient presents with  . New Evaluation    aaa referred by Dr Bartle    HISTORY OF PRESENT ILLNESS: This is an 81-year-old gentleman who was found to have a 5.1 x 4.6 cm infrarenal abdominal aortic aneurysm on a recent CT scan.  Previously it was noted to be 4 cm in 2011.  The patient is asymptomatic and denies abdominal pain.  He has recently undergone transfemoral aortic valve replacement.  He has a history of coronary artery disease and has undergone stenting in the past.  He suffers and hyperlipidemia which is managed with a statin.  He is medically managed for hypertension.  He suffers from diabetes.  His hemoglobin A1c was 7.4 in June 2016.  He is a nonsmoker  Past Medical History  Diagnosis Date  . Hypertension   . Coronary artery disease   . Hyperlipidemia   . Severe aortic stenosis 05/06/2014  . Constipation   . Type II diabetes mellitus   . Osteoarthritis   . Arthritis     "left shoulder" (07/04/2014)  . Depression   . Kidney stones X 1  . Inferior myocardial infarction 2003    /notes 06/25/2014  . Pneumonia 1935; 1990's X 1    Past Surgical History  Procedure Laterality Date  . Coronary angiogram  09/12/2012    Procedure: CORONARY ANGIOGRAM;  Surgeon: Mohan N Harwani, MD;  Location: [facility] CATH [facility];  Service: Cardiovascular;;  . Left and right heart catheterization with coronary angiogram N/A 05/21/2014    Procedure: LEFT AND RIGHT HEART CATHETERIZATION WITH CORONARY ANGIOGRAM;  Surgeon: Mohan Harwani, MD;  Location: [facility] CATH [facility];  Service: Cardiovascular;  Laterality: N/A;  . Colonoscopy    . Lithotripsy    . Transcatheter aortic valve replacement, transfemoral N/A 07/02/2014    Procedure: TRANSCATHETER AORTIC VALVE REPLACEMENT, TRANSFEMORAL;  Surgeon: Michael Cooper, MD;  Location: [facility] OR;  Service: Open Heart Surgery;  Laterality:  N/A;  TAVR-TF APPROACH (23MM VALVE)  . Tee without cardioversion N/A 07/02/2014    Procedure: TRANSESOPHAGEAL ECHOCARDIOGRAM (TEE);  Surgeon: Michael Cooper, MD;  Location: [facility] OR;  Service: Open Heart Surgery;  Laterality: N/A;  . Cardiac valve replacement    . Cardiac catheterization  2003; 2016    /notes 06/25/2014  . Coronary angioplasty with stent placement  2009    /notes 06/25/2014    History   Social History  . Marital Status: Married    Spouse Name: N/A  . Number of Children: N/A  . Years of Education: N/A   Occupational History  . Not on file.   Social History Main Topics  . Smoking status: Never Smoker   . Smokeless tobacco: Never Used  . Alcohol Use: No  . Drug Use: No  . Sexual Activity: No   Other Topics Concern  . Not on file   Social History Narrative    Family History  Problem Relation Age of Onset  . Hypertension Father   . AAA (abdominal aortic aneurysm) Father   . Cancer Mother     Allergies as of 08/09/2014  . (No Known Allergies)    Current Outpatient Prescriptions on File Prior to Visit  Medication Sig Dispense Refill  . acetaminophen (TYLENOL) 500 MG tablet Take 500 mg by mouth every 6 (six) hours as needed for pain.    . amLODipine (NORVASC) 5   MG tablet Take 5 mg by mouth 2 (two) times daily.    Marland Kitchen. aspirin 81 MG tablet Take 81 mg by mouth daily.    . clopidogrel (PLAVIX) 75 MG tablet Take 75 mg by mouth daily.    Marland Kitchen. docusate sodium (COLACE) 100 MG capsule Take 2 capsules (200 mg total) by mouth daily. 10 capsule 0  . famotidine (PEPCID) 20 MG tablet Take 1 tablet (20 mg total) by mouth 2 (two) times daily as needed for heartburn or indigestion.    . fish oil-omega-3 fatty acids 1000 MG capsule Take 3 g by mouth daily.     Marland Kitchen. glimepiride (AMARYL) 2 MG tablet Take 2 mg by mouth daily.     . metFORMIN (GLUCOPHAGE) 500 MG tablet Take 500-1,000 mg by mouth 2 (two) times daily with a meal. Takes 2 tablets in am and 1 tablet in pm    . metoprolol  succinate (TOPROL-XL) 25 MG 24 hr tablet Take 1 tablet (25 mg total) by mouth daily. Take with or immediately following a meal. 30 tablet 11  . OVER THE COUNTER MEDICATION Take 1-2 tablets by mouth at bedtime as needed. For sleep. Equate brand Nighttime Sleep Aid    . rosuvastatin (CRESTOR) 20 MG tablet Take 20 mg by mouth daily.     No current facility-administered medications on file prior to visit.     REVIEW OF SYSTEMS: Cardiovascular: Positive for chest pain, shortness of breath when lying flat, and leg swelling. Pulmonary: Positive for productive cough, negative for asthma or wheezing. Neurologic: No weakness, paresthesias, aphasia, or amaurosis. No dizziness. Hematologic: No bleeding problems or clotting disorders. Musculoskeletal: No joint pain or joint swelling. Gastrointestinal: No blood in stool or hematemesis Genitourinary: No dysuria or hematuria. Psychiatric:: No history of major depression. Integumentary: No rashes or ulcers. Constitutional: No fever or chills.  PHYSICAL EXAMINATION:  Filed Vitals:   08/09/14 1249  BP: 164/75  Pulse: 68  Resp: 16  Height: 5\' 8"  (1.727 m)  Weight: 199 lb (90.266 kg)   Body mass index is 30.26 kg/(m^2). General: The patient appears their stated age.   HEENT:  No gross abnormalities Pulmonary: Respirations are non-labored Abdomen: Soft and non-tender  Musculoskeletal: There are no major deformities.   Neurologic: No focal weakness or paresthesias are detected, Skin: There are no ulcer or rashes noted. Psychiatric: The patient has normal affect. Cardiovascular: There is a regular rate and rhythm without significant murmur appreciated.  No carotid bruits.  Palpable pedal pulses bilaterally Healed left longitudinal femoral incision  Diagnostic Studies: I have reviewed his CT angiogram.  The patient has a 5.1 cm infrarenal abdominal aortic aneurysm    Assessment:  Abdominal aortic aneurysm Plan: Treatment options were  reviewed with the patient.  We have elected to proceed with endovascular repair of his abdominal aortic aneurysm.  I discussed trying to do this percutaneously, however I may need to open his left femoral incision.  We discussed the risks and benefits of the operation which include but are not limited to death, cardiac pelvic complications, intestinal ischemia, lower extremity ischemia.  He wishes to proceed.  His operation has been scheduled for Thursday, July 28. I'm going to stop his Plavix 5 days prior to his operation.  I'm going to check preoperative carotid Doppler studies today.  There is not lab availability to get lower extremity Dopplers today to look for a popliteal aneurysm.  I will have this performed while he is in the hospital.  Eldridge Abrahams, M.D. [facility] [place] Office: [phone] Pager:  [phone]

## 2014-08-22 NOTE — Interval H&P Note (Signed)
History and Physical Interval Note:  08/22/2014 10:38 AM  Michael Wolfe  has presented today for surgery, with the diagnosis of Abdominal Aortic Aneurysm  I71.4  The various methods of treatment have been discussed with the patient and family. After consideration of risks, benefits and other options for treatment, the patient has consented to  Procedure(s): ABDOMINAL AORTIC ENDOVASCULAR STENT GRAFT- GORE (N/A) as a surgical intervention .  The patient's history has been reviewed, patient examined, no change in status, stable for surgery.  I have reviewed the patient's chart and labs.  Questions were answered to the patient's satisfaction.     Durene Cal

## 2014-08-22 NOTE — Op Note (Signed)
Patient name: Michael Wolfe MRN: [id number] DOB: 16-Aug-1931 Sex: male  08/22/2014 Pre-operative Diagnosis: Abdominal aortic aneurysm Post-operative diagnosis:  Same Surgeon:  Durene Cal Co-surgeon:  Dr. Imogene Burn Procedure:   #1: Endovascular repair of abdominal aortic aneurysm   #2: Distal extension 1   #3: Open left common femoral artery exposure   #4: Ultrasound-guided access, right femoral artery   #5: Catheter in aorta 2   #6: Abdominal aortogram Anesthesia:  Gen. Blood Loss:  See anesthesia record Specimens:  None  Findings:  Complete exclusion.  The patient had a previous vertical left groin incision for placement of a TAVR.  The pro-glide device did not adequately close this artery and therefore I had to cut down and repair it primarily.  Devices used: Main body was primary right Gore 26 x 14 x 12.  Ipsilateral extension was a Gore 16 x 8.5.  Contralateral limb was a Gore 16 x 13.5 device on the left side  Indications:  The patient was found to have an abdominal aortic aneurysm after screening for TAVR.  He is here today for his repair.  Procedure:  The patient was identified in the holding area and taken to [facility] OR ROOM 16  The patient was then placed supine on the table. general anesthesia was administered.  The patient was prepped and draped in the usual sterile fashion.  A time out was called and antibiotics were administered.  Ultrasound was used to evaluate bilateral common femoral arteries.  They're patent without significant disease.  A digital ultrasound image was acquired.  A #11 blade was used to make a skin nick bilaterally.  Bilateral common femoral arteries were then cannulated under ultrasound guidance with an 18-gauge needle.  An 035 wires were advanced without resistance.  Septated tract was dilated with an 8 [place] dilator.  Provide devices were deployed at the 11:00 and 1:00 position.  8 French sheaths were placed bilaterally.  I was somewhat concerned about the  left groin.  I wasn't sure if the pro-glide device had good purchase.  There was no significant bleeding and therefore elected to proceed.  The patient was fully heparinized.  Amplatz wires were placed bilaterally.  A 16 French sheath was advanced up the right side and placed into the aorta.  An Omni Flush catheter was advanced up the left side and positioned at L1.  The main body device was then prepped the back table.  This was a Gore 26 x 14 x 12 device was advanced into the aorta from the right side.  A aortogram was performed identifying the location of the lowest left renal artery.  The device was then deployed down to the contralateral gate.  The gate was cannulated with a Berenstein 2 catheter and a Transport planner.  A Omni flush catheter was then placed and able to be freely rotated within the main body, confirming successful cannulation.  An Amplatz superstiff wire was then inserted.  The image detector was then rotated to the right anterior oblique position and a retrograde injection was performed locating the left hypogastric artery.  A 12 French sheath was then inserted.  The contralateral extension was prepared on the back table this was a Gore 16 x 13.5 device.  It was inserted and successfully deployed landing just proximal to the left hypogastric artery.  The remaining portion of the ipsilateral device was deployed and then the delivery system was removed.  A retrograde sheath injection was performed with the detector and a  left anterior oblique position, locating the right hypogastric artery.  A right-sided extension was then prepared and inserted.  This was a Gore 16 x 8.5 device.  Next, a Q-50 balloon was used to mold the proximal and distal attachment sites as well as device overlap.  Completion image was acquired.  This showed successful exclusion of aneurysm with preserved patency of bilateral renal and bilateral hypogastric arteries.  Next Far[place]ault wires were inserted.  The right side was closed by  deploying the previously placed probe glide devices.  There was good hemostasis.  Attention was then turned towards the left groin.  The pro-glide devices were secured, however there was very poor hemostasis.  This was a concern from the beginning, therefore instead of placing another device I elected to reinsert the 12 French sheath.  I then proceeded to cut down.  I opened the existing longitudinal incision.  I exposed the common femoral artery up under the inguinal ligament.  I did inflate the Q-50 balloon for proximal control until I could dissect out around the vessel and get control with the vessel loop.  I then got distal control with a vessel loop.  The sheath and balloon and wire were then removed.  I repaired the arteriotomy transversely with 2 5-0 Prolene sutures.  The appropriate flushing maneuvers were then performed.  There is good inflow and outflow.  The arteriotomy was closed.  There is good hemostasis.  The wound was irrigated.  50 mg of protamine was given.  Once hemostasis was satisfactory the incision was closed with a layer of 20 followed by several layers of 30 Vicryls and Dermabond on the skin.  There were no immediate complications.  The patient had palpable pedal pulses.   Disposition:  To PACU in stable condition.   Juleen China, M.D. [facility] [place] Office: [phone] Pager:  [phone]

## 2014-08-22 NOTE — Transfer of Care (Signed)
Immediate Anesthesia Transfer of Care Note  Patient: Michael Wolfe  Procedure(s) Performed: Procedure(s): ABDOMINAL AORTIC ENDOVASCULAR STENT GRAFT- GORE  (N/A)  Patient Location: PACU  Anesthesia Type:General  Level of Consciousness: awake, alert , oriented and patient cooperative  Airway & Oxygen Therapy: Patient Spontanous Breathing and Patient connected to nasal cannula oxygen  Post-op Assessment: Report given to RN, Post -op Vital signs reviewed and stable and Patient moving all extremities X 4  Post vital signs: Reviewed and stable  Last Vitals:  Filed Vitals:   08/22/14 0807  BP: 189/63  Pulse: 58  Temp: 36.4 C  Resp: 18    Complications: No apparent anesthesia complications

## 2014-08-22 NOTE — Op Note (Signed)
OPERATIVE NOTE   PROCEDURE: 1. Placement of catheter in aorta  2. Placement of left iliac limb (16 mm x 9.5 cm)  (Dictated by Dr. Myra Gianotti) 3. Bilateral common femoral artery cannulation under ultrasound guidance 4. Preclose repair of right common femoral artery  5. Left common femoral artery exposure and repair 6. Placement of catheter in aorta 7. Aortogram 8. Placement of bifurcated aortic endograft (26 mm x 14 mm x 12 cm) 9. Placement of right iliac limb extension (16 mm x 9.5 cm) 5. Radiologic S&I   PRE-OPERATIVE DIAGNOSIS: enlarging abdominal aortic aneurysm   POST-OPERATIVE DIAGNOSIS: same as above   CO-SURGEONS: Coral Else, MD; Leonides Sake, MD  ANESTHESIA: general  ESTIMATED BLOOD LOSS: 250 cc  FINDING(S): 1. Successful exclusion of abdominal aortic aneurysm with preservation of bilateral renal and internal iliac arteries  2. Delayed Type 2 endoleak  SPECIMEN(S): none  INDICATIONS:  Michael Wolfe is a 81 y.o. (29-Dec-1931) male  who presents for endovascular aortic repair. The patient is aware the risks of endovascular aortic surgery include but are not limited to: bleeding, need for transfusion, infection, death, stroke, paralysis, wound complications, bowel ischemia, extended ventilation, anaphylactic reaction to contrast, contrast induced nephropathy, embolism, and need for additional procedure to address endoleaks. Patient is also at risk for pelvic ischemia. Overall, Dr. Myra Gianotti cited a mortality rate of 1-2% and morbidity rate of 15%.  DESCRIPTION: After obtaining full informed written consent, the patient was brought back to the operating room and placed supine upon the operating table. The patient received IV antibiotics prior to induction. After obtaining adequate anesthesia, the patient was prepped and draped in the standard fashion for: open aortic and endovascular aortic repair. A two-surgeon technique was utilized due to expected  complications due to recent surgical exposure of left common femoral artery, need to minimize operative time, and need for a surgeon to maintain position of main body prior to engagement of fixation barbs at the end of case.  Dr. Myra Gianotti performed the pre-close technique on both common femoral artery, including the prior exposed left common femoral artery.  He also placed a regular 8-Fr sheath in the right common femoral artery. See his Operative Note for details.  I loaded a long 8-Fr sheath over the left wire into the left iliac arterial system.  I loaded a Omniflush catheter over the wire and steered the wire into the suprarenal aorta. I advanced the catheter into the suprarenal aortic position.  Dr. Myra Gianotti, meanwhile, exchanged the right wire for an Amplatz wire.  The right sheath was exchanged for the 14-Fr sheath.  He then loaded the main body over the right wire.  See his Op note for details.  An aortogram was completed to determine position of right renal accessory artery, the lowest renal artery. Dr. Myra Gianotti deployed the main body.  Completion aortogram demonstrated deployment of the proximal graft below this accessory renal artery.    I replaced the [facility] wire then then straightened out the Omniflush catheter.  Both the catheter and wire were pulled distal to the contralateral gate.  This catheter was exchanged for the BER-2 catheter.  Using this combination, I cannulated the contralateral gate.  The catheter was advanced into the suprarenal position.  I exchanged the wire for an Amplatz wire.  I then exchanged the catheter for a Pigtail catheter.  This was reformed in the main body and rotate to demonstrate successful intra-graft cannulation.  I replaced the Amplatz wire and pulled the catheter down  to the flow divider.  At this point, I did a left retrograde sheath injection to verify the position of the left internal iliac artery.  Based on this injection, a 16 mm x 9.5 cm iliac limb was  selected.  I removed the catheter and then exchanged the sheath for a 12-Fr Dryseal sheath, which I advanced into the contralateral gate.  I placed the iliac limb with adequate overlap.  The sheath was pulled back.  I held forward pressure as Dr. Myra Gianotti deployed the left iliac limb.  The stent delivery system was removed.  Completion imaging demonstrated continued patency of the left internal iliac artery.  At this point, Dr. Myra Gianotti deployed the main body and released the main body from the delivery device.  This delivery device was removed.  He did a retrograde injection to image the right internal iliac artery.  Based on the images, a 16 mm x 9.5 cm iliac limb extension was selected.  He placed the limb with adequate overlap.  I deployed the limb which Dr. Myra Gianotti held position.  The stent delivery system was removed.    I then placed the Q50 over the left wire and advanced it to the proximal aortic graft.  Dr. Myra Gianotti inflated the molding balloon.  I pulled the balloon back to the proximal overlapping segment.  Dr. Myra Gianotti inflated the molding balloon.  I then pulled it back to proximal common iliac artery where the balloon was  inflated to treat proximal common iliac artery stenosis.  Finally, I pulled the balloon into the distal left iliac limb where the molding balloon was again used.  The balloon was deflated and removed.  Dr. Myra Gianotti then treated the overlap segments and distal limb on the right side.  Meanwhile, I placed the pigtail catheter in the suprarenal aorta and did a completion aortogram. The findings are as listed above: exclusion of aneurysm with a delayed Type 2 endoleak. At this point, the pigtail was removed over a wire.  At this point, Dr. Myra Gianotti repaired the right common femoral artery by sequentially tightening the Proglide sutures previously placed.  The sutures were placed under tension.  See his note for details.  The same was attempted on the left side but the Proglide  sutures did not appear to be adequent on this side.  He proceeded with an open exposure and repair of the left common femoral artery.  See his note for detail.  After the repair of the left common femoral artery, I then transected the Proglide sutures in the right common femoral artery.  I placed a single U-stitch of 4-0 Vicryl in the right groin incision.  The skin was cleaned, dried, and Dermabond placed in both groin incisions.   COMPLICATIONS: none  CONDITION: stable   Leonides Sake, MD [facility] [place] Office: [phone] Pager: [phone]  08/22/2014, 2:16 PM

## 2014-08-22 NOTE — Progress Notes (Addendum)
  [facility]  Subjective  No complaints   Objective Filed Vitals:   08/22/14 1600  BP: 135/57  Pulse: 56  Temp: 97.5 F (36.4 C)  Resp: 19    Intake/Output Summary (Last 24 hours) at 08/22/14 1645 Last data filed at 08/22/14 1600  Gross per 24 hour  Intake   1200 ml  Output    975 ml  Net    225 ml   Palpable pedal pulses b/l Left groin incision c/d/i, no hematoma, right groin sheath site without hematoma  Assessment/Planning: 81 y.o. male is s/p: [facility] Day of Surgery   Stable post-operatively Likely d/c in am  Raymond Gurney 08/22/2014 4:45 PM --  Laboratory CBC    Component Value Date/Time   WBC 8.3 08/22/2014 1520   HGB 11.1* 08/22/2014 1520   HCT 32.5* 08/22/2014 1520   PLT 125* 08/22/2014 1520    BMET    Component Value Date/Time   NA 138 08/22/2014 1520   K 4.2 08/22/2014 1520   CL 109 08/22/2014 1520   CO2 23 08/22/2014 1520   GLUCOSE 199* 08/22/2014 1520   BUN 19 08/22/2014 1520   CREATININE 1.33* 08/22/2014 1520   CALCIUM 8.5* 08/22/2014 1520   GFRNONAA 49* 08/22/2014 1520   GFRAA 57* 08/22/2014 1520    COAG Lab Results  Component Value Date   INR 1.24 08/22/2014   INR 1.03 08/15/2014   INR 1.21 07/02/2014   No results found for: PTT  Antibiotics Anti-infectives    Start     Dose/Rate Route Frequency Ordered Stop   08/22/14 1000  cefUROXime (ZINACEF) 1.5 g in dextrose 5 % 50 mL IVPB     1.5 g 100 mL/hr over 30 Minutes Intravenous To ShortStay Surgical 08/21/14 1316 08/22/14 1130       Maris Berger, [place] [facility] Office: [phone] Pager: [phone] 08/22/2014 4:45 PM     Agree with the above  Anticipate discharge in am.  Dr Imogene Burn will evaluate in the am as I am out of town  [employer]

## 2014-08-22 NOTE — Anesthesia Procedure Notes (Signed)
Procedure Name: Intubation Date/Time: 08/22/2014 11:24 AM Performed by: Charm Barges, Salif Tay R Pre-anesthesia Checklist: Patient identified, Emergency Drugs available, Suction available, Patient being monitored and Timeout performed Patient Re-evaluated:Patient Re-evaluated prior to inductionOxygen Delivery Method: Circle system utilized Preoxygenation: Pre-oxygenation with 100% oxygen Intubation Type: IV induction Ventilation: Oral airway inserted - appropriate to patient size and Mask ventilation without difficulty Laryngoscope Size: Miller and 3 Grade View: Grade I Tube type: Oral Number of attempts: 1 Airway Equipment and Method: Stylet Placement Confirmation: ETT inserted through vocal cords under direct vision,  positive ETCO2 and breath sounds checked- equal and bilateral Secured at: 23 cm Tube secured with: Tape Dental Injury: Teeth and Oropharynx as per pre-operative assessment

## 2014-08-22 NOTE — Anesthesia Preprocedure Evaluation (Addendum)
Anesthesia Evaluation    Reviewed: Allergy & Precautions, NPO status , Patient's Chart, lab work & pertinent test results, reviewed documented beta blocker date and time   History of Anesthesia Complications Negative for: history of anesthetic complications  Airway Mallampati: II  TM Distance: >3 FB Neck ROM: Full    Dental  (+) Edentulous Upper, Edentulous Lower   Pulmonary  breath sounds clear to auscultation        Cardiovascular hypertension, Pt. on medications and Pt. on home beta blockers + angina + CAD, + Past MI, + Cardiac Stents and + Peripheral Vascular Disease + Valvular Problems/Murmurs Rhythm:Regular + Systolic murmurs S/p TAVR, normal function    Neuro/Psych  Headaches, PSYCHIATRIC DISORDERS Depression    GI/Hepatic Neg liver ROS, hiatal hernia, GERD-  Medicated and Controlled,  Endo/Other  diabetes, Type 2  Renal/GU Renal InsufficiencyRenal disease     Musculoskeletal  (+) Arthritis -,   Abdominal   Peds  Hematology   Anesthesia Other Findings   Reproductive/Obstetrics                            Anesthesia Physical Anesthesia Plan  ASA: III  Anesthesia Plan: General   Post-op Pain Management:    Induction: Intravenous  Airway Management Planned: Oral ETT  Additional Equipment: Arterial line  Intra-op Plan:   Post-operative Plan: Extubation in OR  Informed Consent: I have reviewed the patients History and Physical, chart, labs and discussed the procedure including the risks, benefits and alternatives for the proposed anesthesia with the patient or authorized representative who has indicated his/her understanding and acceptance.   Dental advisory given  Plan Discussed with: CRNA and Surgeon  Anesthesia Plan Comments:         Anesthesia Quick Evaluation

## 2014-08-23 ENCOUNTER — Inpatient Hospital Stay (HOSPITAL_COMMUNITY): Payer: Medicare Other

## 2014-08-23 ENCOUNTER — Encounter (HOSPITAL_COMMUNITY): Payer: Self-pay | Admitting: *Deleted

## 2014-08-23 ENCOUNTER — Other Ambulatory Visit: Payer: Self-pay | Admitting: *Deleted

## 2014-08-23 DIAGNOSIS — Z48812 Encounter for surgical aftercare following surgery on the circulatory system: Secondary | ICD-10-CM

## 2014-08-23 DIAGNOSIS — I714 Abdominal aortic aneurysm, without rupture, unspecified: Secondary | ICD-10-CM

## 2014-08-23 LAB — GLUCOSE, CAPILLARY
GLUCOSE-CAPILLARY: 246 mg/dL — AB (ref 65–99)
GLUCOSE-CAPILLARY: 145 mg/dL — AB (ref 65–99)
Glucose-Capillary: 243 mg/dL — ABNORMAL HIGH (ref 65–99)
Glucose-Capillary: 190 mg/dL — ABNORMAL HIGH (ref 65–99)

## 2014-08-23 LAB — BASIC METABOLIC PANEL
Anion gap: 7 (ref 5–15)
BUN: 19 mg/dL (ref 6–20)
CO2: 24 mmol/L (ref 22–32)
Calcium: 8.2 mg/dL — ABNORMAL LOW (ref 8.9–10.3)
Chloride: 105 mmol/L (ref 101–111)
Creatinine, Ser: 1.23 mg/dL (ref 0.61–1.24)
GFR calc Af Amer: >60 mL/min (ref >60)
GFR, EST NON AFRICAN AMERICAN: 54 mL/min — AB (ref >60)
Glucose, Bld: 175 mg/dL — ABNORMAL HIGH (ref 65–99)
Potassium: 4.2 mmol/L (ref 3.5–5.1)
SODIUM: 136 mmol/L (ref 135–145)

## 2014-08-23 LAB — CBC
HEMATOCRIT: 34.5 % — AB (ref 39.0–52.0)
HEMOGLOBIN: 11.7 g/dL — AB (ref 13.0–17.0)
MCH: 30.6 pg (ref 26.0–34.0)
MCHC: 33.9 g/dL (ref 30.0–36.0)
MCV: 90.3 fL (ref 78.0–100.0)
Platelets: 110 10*3/uL — ABNORMAL LOW (ref 150–400)
RBC: 3.82 MIL/uL — AB (ref 4.22–5.81)
RDW: 14.4 % (ref 11.5–15.5)
WBC: 9.7 10*3/uL (ref 4.0–10.5)

## 2014-08-23 MED ORDER — OXYCODONE HCL 5 MG PO TABS: 5.0000 mg | ORAL_TABLET | ORAL | Status: DC | PRN

## 2014-08-23 NOTE — Progress Notes (Signed)
Transfer report received from 3S at 1725 and pt arrived to the unit via wheelchair with belongings to the side at 1800. Pt oriented to the unit and room; VSS; telemetry applied and verified; IV intact and transfusing upon arrival to the unit; left groin incision dermabond clean, dry and intact, site slightly edematous; tender and warm to touch. Not discharge or active bleeding noted to site. Pt in bed comfortably resting with call light within reach. Will closely monitor. Michael Merles Sharnise Blough RN.

## 2014-08-23 NOTE — Progress Notes (Deleted)
Bilateral lower extremity venous duplex completed:  No evidence of DVT, superficial thrombosis, or Baker's cyst.   

## 2014-08-23 NOTE — Progress Notes (Signed)
UR COMPLETED  

## 2014-08-23 NOTE — Anesthesia Postprocedure Evaluation (Signed)
  Anesthesia Post-op Note  Patient: Michael Wolfe  Procedure(s) Performed: Procedure(s): ABDOMINAL AORTIC ENDOVASCULAR STENT GRAFT- GORE  (N/A)  Patient Location: PACU  Anesthesia Type:General  Level of Consciousness: awake  Airway and Oxygen Therapy: Patient Spontanous Breathing  Post-op Pain: mild  Post-op Assessment: Post-op Vital signs reviewed, Patient's Cardiovascular Status Stable, Respiratory Function Stable, Patent Airway, No signs of Nausea or vomiting and Pain level controlled LLE Motor Response: Purposeful movement, Responds to commands LLE Sensation: Full sensation, No numbness, No tingling RLE Motor Response: Purposeful movement, Responds to commands RLE Sensation: Full sensation, No tingling, No numbness      Post-op Vital Signs: Reviewed and stable  Last Vitals:  Filed Vitals:   08/23/14 0730  BP: 111/49  Pulse: 72  Temp:   Resp: 28    Complications: No apparent anesthesia complications

## 2014-08-23 NOTE — Progress Notes (Signed)
Pt unable to void, bladder scan for 121cc, paged maureen pa, will wait awhile

## 2014-08-23 NOTE — Progress Notes (Addendum)
Pt just found out that his niece suddenly passed away tonight.  He is sadden by this.  I encouraged snacking and hydration but pt refused and stated "I don't feel like it right now, there's just so much going on". I will continue to encourage PO intake and fluids. Also offered the bathroom but pt refused this as well.

## 2014-08-23 NOTE — Progress Notes (Signed)
Bilateral lower extremity arterial duplex completed.  Negative for popliteal aneurysm.

## 2014-08-23 NOTE — Progress Notes (Signed)
Pt voided a small amount while return to bed was tachy, c/o sob and chest hurting, subsided when layed down, maureen pa at bedside will keep overnight.

## 2014-08-23 NOTE — Progress Notes (Signed)
Pt refused to eat his dinner and drink anything.  He told CNA that he did not feel well or up to eating right now.

## 2014-08-23 NOTE — Progress Notes (Addendum)
[facility] [place]  Subjective  - Doing well over all pain issues with the left groin.   Objective 123/64 66 99.1 F (37.3 C) (Oral) 14 86%  Intake/Output Summary (Last 24 hours) at 08/23/14 0714 Last data filed at 08/23/14 0331  Gross per 24 hour  Intake 2877.5 ml  Output   1750 ml  Net 1127.5 ml    Palpable DP/PT bilaterally Right groin soft without hematoma, left groin with min. Edema, no frank hematoma Heart RRR Lungs non labored breathing on 2 L O2 [place]  Assessment/Planning: POD # 1  Procedure: #1: Endovascular repair of abdominal aortic aneurysm #2: Distal extension 1 #3: Open left common femoral artery exposure #4: Ultrasound-guided access, right femoral artery #5: Catheter in aorta 2 #6: Abdominal aortogram He needs to ambulate,  Eat breakfast and void before he is discharged home.   Plan discharge home today  Clinton Gallant [facility] 08/23/2014 7:14 AM --  Laboratory Lab Results:  Recent Labs  08/22/14 1520 08/23/14 0315  WBC 8.3 9.7  HGB 11.1* 11.7*  HCT 32.5* 34.5*  PLT 125* 110*   BMET  Recent Labs  08/22/14 1520 08/23/14 0315  NA 138 136  K 4.2 4.2  CL 109 105  CO2 23 24  GLUCOSE 199* 175*  BUN 19 19  CREATININE 1.33* 1.23  CALCIUM 8.5* 8.2*    COAG Lab Results  Component Value Date   INR 1.24 08/22/2014   INR 1.03 08/15/2014   INR 1.21 07/02/2014   No results found for: PTT  Addendum  I have independently interviewed and examined the patient, and I agree with the physician assistant's findings.  D/C after voids and tolerates PO.  Follow up with Dr. Myra Gianotti in 4 weeks with CTA.  Leonides Sake, MD [facility] [place] Office: [phone] Pager: [phone]  08/23/2014, 7:59 AM

## 2014-08-23 NOTE — Progress Notes (Signed)
Report called to RN, pt transferring to 2W11 via w/c with belongingsl and dinner. Notified wife of new room number via phone.

## 2014-08-24 ENCOUNTER — Inpatient Hospital Stay (HOSPITAL_COMMUNITY): Payer: Medicare Other

## 2014-08-24 LAB — BASIC METABOLIC PANEL
ANION GAP: 9 (ref 5–15)
BUN: 20 mg/dL (ref 6–20)
CHLORIDE: 101 mmol/L (ref 101–111)
CO2: 24 mmol/L (ref 22–32)
Calcium: 8.1 mg/dL — ABNORMAL LOW (ref 8.9–10.3)
Creatinine, Ser: 1.36 mg/dL — ABNORMAL HIGH (ref 0.61–1.24)
GFR calc non Af Amer: 48 mL/min — ABNORMAL LOW (ref >60)
GFR, EST AFRICAN AMERICAN: 55 mL/min — AB (ref >60)
Glucose, Bld: 175 mg/dL — ABNORMAL HIGH (ref 65–99)
POTASSIUM: 4 mmol/L (ref 3.5–5.1)
Sodium: 134 mmol/L — ABNORMAL LOW (ref 135–145)

## 2014-08-24 LAB — CBC
HCT: 32.2 % — ABNORMAL LOW (ref 39.0–52.0)
HEMOGLOBIN: 10.5 g/dL — AB (ref 13.0–17.0)
MCH: 29.8 pg (ref 26.0–34.0)
MCHC: 32.6 g/dL (ref 30.0–36.0)
MCV: 91.5 fL (ref 78.0–100.0)
PLATELETS: 100 10*3/uL — AB (ref 150–400)
RBC: 3.52 MIL/uL — AB (ref 4.22–5.81)
RDW: 14.5 % (ref 11.5–15.5)
WBC: 11.5 10*3/uL — AB (ref 4.0–10.5)

## 2014-08-24 LAB — GLUCOSE, CAPILLARY
GLUCOSE-CAPILLARY: 127 mg/dL — AB (ref 65–99)
GLUCOSE-CAPILLARY: 125 mg/dL — AB (ref 65–99)
Glucose-Capillary: 175 mg/dL — ABNORMAL HIGH (ref 65–99)
Glucose-Capillary: 138 mg/dL — ABNORMAL HIGH (ref 65–99)

## 2014-08-24 LAB — AMYLASE: AMYLASE: 34 U/L (ref 28–100)

## 2014-08-24 LAB — LIPASE, BLOOD: LIPASE: 17 U/L — AB (ref 22–51)

## 2014-08-24 MED ORDER — TRAMADOL HCL 50 MG PO TABS
50.0000 mg | ORAL_TABLET | Freq: Four times a day (QID) | ORAL | Status: DC
  Administered 2014-08-24 – 2014-08-25 (×4): 50 mg via ORAL
  Filled 2014-08-24 (×6): qty 1

## 2014-08-24 NOTE — Progress Notes (Signed)
Pt woke up stating that he had a bad dream. But would not tell me what dream was about. He seemed agitated by the dream. I offered pt medication to help him sleep and pain medication for his pain but he refused them all.  Will continue to monitor pt.

## 2014-08-24 NOTE — Progress Notes (Addendum)
[facility] [employer]  Subjective  - He vomited this am coffee colored this liquid.  He    Objective 152/60 94 101.1 F (38.4 C) (Oral) 20 95%  Intake/Output Summary (Last 24 hours) at 08/24/14 0651 Last data filed at 08/24/14 0314  Gross per 24 hour  Intake 1686.25 ml  Output    500 ml  Net 1186.25 ml    Right groin soft Left ecchymosis min. Edema tender to palpation Heart  Lungs non labored breathing no acute distress Speech clear A & O Abdomin positive BS  Assessment/Planning: POD # 2 EVAR  He vomited this am he is not eating much and had coke yesterday the vomit was thin liquid without grounds.  He has no history of GI upper or lower bleeding.  He has history of vomiting and it makes him feel better on  Random occasions at home.  We ordered 2 view Abd flat and up right, bmet repeat, Amylase and Lipase. Tm 101.1, WBC 9.7 Stint reaction or likely atelectasis.  Clinton Gallant [facility] 08/24/2014 6:51 AM --  Laboratory Lab Results:  Recent Labs  08/22/14 1520 08/23/14 0315  WBC 8.3 9.7  HGB 11.1* 11.7*  HCT 32.5* 34.5*  PLT 125* 110*   BMET  Recent Labs  08/22/14 1520 08/23/14 0315  NA 138 136  K 4.2 4.2  CL 109 105  CO2 23 24  GLUCOSE 199* 175*  BUN 19 19  CREATININE 1.33* 1.23  CALCIUM 8.5* 8.2*    COAG Lab Results  Component Value Date   INR 1.24 08/22/2014   INR 1.03 08/15/2014   INR 1.21 07/02/2014   No results found for: PTT  Addendum  I have independently interviewed and examined the patient, and I agree with the physician assistant's findings.  Pt has history of nausea and emesis suggestive of possible gastroparesis.  Change pain med to Ultram.  Check BMP and CBC.  IV fluid.  Home tomorrow if resolved.  Leonides Sake, MD [facility] [place] Office: [phone] Pager: [phone]  08/24/2014, 7:22 AM

## 2014-08-24 NOTE — Progress Notes (Signed)
Rn notified about patient patient having 9 beats of V-tach. Patient was asymptomatic, lab resukls reviewed and MD on call notified. No new orders at this times. Will continue to monitor.

## 2014-08-24 NOTE — Progress Notes (Signed)
Pt experienced nausea and vomiting this AM.  Vomit was brown in color.  MD aware. Nausea medications given.

## 2014-08-25 LAB — CBC
HCT: 30.6 % — ABNORMAL LOW (ref 39.0–52.0)
HEMOGLOBIN: 10.3 g/dL — AB (ref 13.0–17.0)
MCH: 30.5 pg (ref 26.0–34.0)
MCHC: 33.7 g/dL (ref 30.0–36.0)
MCV: 90.5 fL (ref 78.0–100.0)
Platelets: 96 10*3/uL — ABNORMAL LOW (ref 150–400)
RBC: 3.38 MIL/uL — ABNORMAL LOW (ref 4.22–5.81)
RDW: 14.4 % (ref 11.5–15.5)
WBC: 10.9 10*3/uL — ABNORMAL HIGH (ref 4.0–10.5)

## 2014-08-25 LAB — BASIC METABOLIC PANEL
ANION GAP: 6 (ref 5–15)
BUN: 17 mg/dL (ref 6–20)
CO2: 24 mmol/L (ref 22–32)
CREATININE: 1.28 mg/dL — AB (ref 0.61–1.24)
Calcium: 8 mg/dL — ABNORMAL LOW (ref 8.9–10.3)
Chloride: 105 mmol/L (ref 101–111)
GFR calc Af Amer: 59 mL/min — ABNORMAL LOW (ref >60)
GFR calc non Af Amer: 51 mL/min — ABNORMAL LOW (ref >60)
Glucose, Bld: 117 mg/dL — ABNORMAL HIGH (ref 65–99)
Potassium: 3.8 mmol/L (ref 3.5–5.1)
Sodium: 135 mmol/L (ref 135–145)

## 2014-08-25 LAB — GLUCOSE, CAPILLARY
Glucose-Capillary: 135 mg/dL — ABNORMAL HIGH (ref 65–99)
Glucose-Capillary: 156 mg/dL — ABNORMAL HIGH (ref 65–99)

## 2014-08-25 MED ORDER — TRAMADOL HCL 50 MG PO TABS: 50.0000 mg | ORAL_TABLET | Freq: Four times a day (QID) | ORAL | Status: DC | PRN

## 2014-08-25 NOTE — Progress Notes (Addendum)
[facility] [place]  Subjective  - No more vomiting, ambulating with rolling walker.  Still complains of pain in the left groin, but controlled with Tramadol.   Objective 135/54 80 98.7 F (37.1 C) (Oral) 18 91%  Intake/Output Summary (Last 24 hours) at 08/25/14 0739 Last data filed at 08/25/14 4098  Gross per 24 hour  Intake    360 ml  Output    476 ml  Net   -116 ml    Palpable left DP/PT, right PT Left groin min edema with mild ecchymosis, soft no frank hematoma Abdomin positive BS Heart sinus rhythm with PVC, asymptomatic  DG abdomin: Gas present within right colon, transverse colon, and left colon. Gas extends to the rectosigmoid junction.  Small amount of central small bowel gas. No abnormal distention of small bowel or colon.  No unexpected radiopaque foreign body.  Assessment/Planning: POD # EVAR Amylase 34, Lipase 17  HGB stable 10.3 was 10.5 stable Cr 1.28 down from 1.36 Ambulating, slow PO intact doesn't like the food here. PVC's occasional asymptomatic he is on Toprol 25 mg Daily Plavix daily F/U in 3-4 weeks with Brabham CTA abd/pelvis   Clinton Gallant [facility] 08/25/2014 7:39 AM --  Laboratory Lab Results:  Recent Labs  08/24/14 0732 08/25/14 0420  WBC 11.5* 10.9*  HGB 10.5* 10.3*  HCT 32.2* 30.6*  PLT 100* 96*   BMET  Recent Labs  08/24/14 0732 08/25/14 0420  NA 134* 135  K 4.0 3.8  CL 101 105  CO2 24 24  GLUCOSE 175* 117*  BUN 20 17  CREATININE 1.36* 1.28*  CALCIUM 8.1* 8.0*    COAG Lab Results  Component Value Date   INR 1.24 08/22/2014   INR 1.03 08/15/2014   INR 1.21 07/02/2014   No results found for: PTT    Addendum  I have independently interviewed and examined the patient, and I agree with the physician assistant's findings.  L groin looks ok.  Pt wants to go home.  Pt baseline has sx c/w gastroparesis, so I suspect his current nausea/vomitting is not acute.  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

## 2014-08-25 NOTE — Care Management Important Message (Addendum)
Important Message  Patient Details  Name: Michael Wolfe MRN: [id number] Date of Birth: [birth year]   Medicare Important Message Given:  Yes-second notification given    Epifanio Lesches, RN 08/25/2014, 8:24 AM

## 2014-08-27 NOTE — Discharge Summary (Signed)
[facility] Discharge Summary   Patient ID:  Michael Wolfe MRN: [id number] DOB/AGE: 06-14-1931 81 y.o.  Admit date: 08/22/2014 Discharge date: 08/25/2014 Date of Surgery: 08/22/2014 Surgeon: Surgeon(s): Nada Libman, MD Fransisco Hertz, MD  Admission Diagnosis: Abdominal Aortic Aneurysm  I71.4  Discharge Diagnoses:  Abdominal Aortic Aneurysm  I71.4  Secondary Diagnoses: Past Medical History  Diagnosis Date  . Hypertension   . Coronary artery disease   . Hyperlipidemia   . Severe aortic stenosis 05/06/2014  . Constipation   . Type II diabetes mellitus   . Osteoarthritis   . Arthritis     "left shoulder" (07/04/2014)  . Depression   . Kidney stones X 1  . Inferior myocardial infarction 2003    /notes 06/25/2014  . Pneumonia 1935; 1990's X 1  . Anginal pain   . Shortness of breath dyspnea   . GERD (gastroesophageal reflux disease)   . History of hiatal hernia   . Headache     Procedure(s): ABDOMINAL AORTIC ENDOVASCULAR STENT GRAFT- GORE   Discharged Condition: good  HPI: This is an 81 year old gentleman who was found to have a 5.1 x 4.6 cm infrarenal abdominal aortic aneurysm on a recent CT scan. Previously it was noted to be 4 cm in 2011. The patient is asymptomatic and denies abdominal pain. He has recently undergone transfemoral aortic valve replacement. He has a history of coronary artery disease and has undergone stenting in the past. He suffers and hyperlipidemia which is managed with a statin. He is medically managed for hypertension. He suffers from diabetes. His hemoglobin A1c was 7.4 in June 2016. He is a nonsmoker. Treatment options were reviewed with the patient. We have elected to proceed with endovascular repair of his abdominal aortic aneurysm. Dr. Myra Gianotti discussed trying to do this percutaneously, however I may need to open his left femoral incision. They discussed the risks and benefits of the operation which include but are  not limited to death, cardiac pelvic complications, intestinal ischemia, lower extremity ischemia. He wishes to proceed. His operation has been scheduled for Thursday, July 28. Dr. Myra Gianotti is going to stop his Plavix 5 days prior to his operation. He is also going to check preoperative carotid Doppler studies today. There is not lab availability to get lower extremity Dopplers today to look for a popliteal aneurysm. I will have this performed while he is in the hospital.  Arterial  duplex bilateral LE: 08/23/2014 Summary: No evidence of an aneurysm in the popliteal artery bilaterally.   Hospital Course:  Michael Wolfe is a 81 y.o. male is S/P  Procedure(s): ABDOMINAL AORTIC ENDOVASCULAR STENT GRAFT- GORE  Procedure: #1: Endovascular repair of abdominal aortic aneurysm #2: Distal extension 1 #3: Open left common femoral artery exposure #4: Ultrasound-guided access, right femoral artery #5: Catheter in aorta 2 #6: Abdominal aortogram Anesthesia: Gen.  Post-op Palpable pedal pulses b/l Left groin incision c/d/i, no hematoma, right groin sheath site without hematoma POD#1  Unable to void independently until late in the day and then became nauseous and dizzy.  Transferred him to 2W for observation, ambulation and PO intake tolerance.   POD# 2 He vomited this am he is not eating much and had coke yesterday the vomit was thin liquid without grounds. He has no history of GI upper or lower bleeding. He has history of vomiting and it makes him feel better on Random occasions at home. We ordered 2 view Abd flat and up right, bmet repeat, Amylase  and Lipase. Tm 101.1, WBC 9.7 Stint reaction or likely atelectasis. POD# 3  Amylase 34, Lipase 17  HGB stable 10.3 was 10.5 stable Cr 1.28 down from 1.36 Ambulating, slow PO intact doesn't like the food here. PVC's  occasional asymptomatic he is on Toprol 25 mg Daily Plavix daily F/U in 3-4 weeks with Brabham CTA abd/pelvis No more nausea, dizziness or vomiting.  Disposition stable.   Significant Diagnostic Studies: CBC Lab Results  Component Value Date   WBC 10.9* 08/25/2014   HGB 10.3* 08/25/2014   HCT 30.6* 08/25/2014   MCV 90.5 08/25/2014   PLT 96* 08/25/2014    BMET    Component Value Date/Time   NA 135 08/25/2014 0420   K 3.8 08/25/2014 0420   CL 105 08/25/2014 0420   CO2 24 08/25/2014 0420   GLUCOSE 117* 08/25/2014 0420   BUN 17 08/25/2014 0420   CREATININE 1.28* 08/25/2014 0420   CALCIUM 8.0* 08/25/2014 0420   GFRNONAA 51* 08/25/2014 0420   GFRAA 59* 08/25/2014 0420   COAG Lab Results  Component Value Date   INR 1.24 08/22/2014   INR 1.03 08/15/2014   INR 1.21 07/02/2014     Disposition:  Discharge to :Home Discharge Instructions    Call MD for:  redness, tenderness, or signs of infection (pain, swelling, bleeding, redness, odor or green/yellow discharge around incision site)    Complete by:  As directed      Call MD for:  severe or increased pain, loss or decreased feeling  in affected limb(s)    Complete by:  As directed      Call MD for:  temperature >100.5    Complete by:  As directed      Discharge instructions    Complete by:  As directed   You may shower in 24 hours     Discharge patient    Complete by:  As directed   Discharge pt to home     Driving Restrictions    Complete by:  As directed   No driving while on narcotics for pain     Lifting restrictions    Complete by:  As directed   No lifting for 8 weeks     Resume previous diet    Complete by:  As directed             Medication List    TAKE these medications        amLODipine 5 MG tablet  Commonly known as:  NORVASC  Take 5 mg by mouth 2 (two) times daily.     aspirin 81 MG tablet  Take 81 mg by mouth daily.     clopidogrel 75 MG tablet  Commonly known as:  PLAVIX  Take 75 mg  by mouth daily.     famotidine 20 MG tablet  Commonly known as:  PEPCID  Take 1 tablet (20 mg total) by mouth 2 (two) times daily as needed for heartburn or indigestion.     fish oil-omega-3 fatty acids 1000 MG capsule  Take 3 g by mouth daily.     glimepiride 2 MG tablet  Commonly known as:  AMARYL  Take 2 mg by mouth daily.     lisinopril 40 MG tablet  Commonly known as:  PRINIVIL,ZESTRIL  Take 40 mg by mouth daily.     metFORMIN 500 MG tablet  Commonly known as:  GLUCOPHAGE  Take 500-1,000 mg by mouth 2 (two) times daily with a meal. Takes 2 tablets  in am and 1 tablet in pm     metoprolol succinate 25 MG 24 hr tablet  Commonly known as:  TOPROL-XL  Take 1 tablet (25 mg total) by mouth daily. Take with or immediately following a meal.     OVER THE COUNTER MEDICATION  Take 1-2 tablets by mouth at bedtime as needed. For sleep. Equate brand Nighttime Sleep Aid     oxyCODONE 5 MG immediate release tablet  Commonly known as:  Oxy IR/ROXICODONE  Take 1 tablet (5 mg total) by mouth every 4 (four) hours as needed for moderate pain.     rosuvastatin 20 MG tablet  Commonly known as:  CRESTOR  Take 20 mg by mouth daily.     traMADol 50 MG tablet  Commonly known as:  ULTRAM  Take 1 tablet (50 mg total) by mouth every 6 (six) hours as needed.       Verbal and written Discharge instructions given to the patient. Wound care per Discharge AVS     Follow-up Information    Follow up with Durene Cal, MD In 2 weeks.   Specialties:  Vascular Surgery, Cardiology   Why:  sent message to office   Contact information:   [address] [place] [address] [phone]       Signed: Clinton Gallant [facility] 08/27/2014, 11:59 AM  - For VQI Registry use --- Instructions: Press F2 to tab through selections.  Delete question if not applicable.   Post-op:  Time to Extubation: [x ] In OR,  < 12 hrs,  12-24 hrs,  >=24 hrs Vasopressors Req. Post-op: No MI: [x ] No,   Troponin only,  EKG or Clinical New Arrhythmia: No CHF: No ICU Stay: 1 days Transfusion: No  If yes,  units given  Complications: Resp failure: [x ] none,  Pneumonia,  Ventilator Chg in renal function: [x ] none,  Inc. Cr > 0.5,  Temp. Dialysis,  Permanent dialysis Leg ischemia: [x ] No,  Yes, no Surgery needed,  Yes, Surgery needed,  Amputation Bowel ischemia: [x ] No,  Medical Rx,  Surgical Rx Wound complication: [x ] No,  Superficial separation/infection,  Return to OR Return to OR: No  Return to OR for bleeding: No Stroke: [x ] None,  Minor,  Major  Discharge medications: Statin use:  Yes ASA use:  Yes Plavix use:  Yes Beta blocker use:  Yes

## 2014-08-28 ENCOUNTER — Telehealth: Payer: Self-pay | Admitting: Surgery

## 2014-08-28 NOTE — Telephone Encounter (Addendum)
-----   Message from Sharee Pimple, RN sent at 08/23/2014  3:20 PM EDT ----- Regarding: Schedule   ----- Message -----    From: Lars Mage, PA-C    Sent: 08/23/2014   7:19 AM      To: Vvs Charge Pool  F/U in 2-3 weeks with Dr. Myra Gianotti s/p EVAR with left groin exposure. CTA  Abdomin and pelvis.  notified patient's  wife of  cta appt. on 09-09-14 at [facility] at 1:40, then his post op appt. on 09-16-14 at 3pm

## 2014-09-09 ENCOUNTER — Ambulatory Visit
Admission: RE | Admit: 2014-09-09 | Discharge: 2014-09-09 | Disposition: A | Discharge status: Home / Self Care | Payer: Medicare Other | Source: Ambulatory Visit | Attending: Surgery | Admitting: Surgery

## 2014-09-09 DIAGNOSIS — I714 Abdominal aortic aneurysm, without rupture, unspecified: Secondary | ICD-10-CM

## 2014-09-09 DIAGNOSIS — Z48812 Encounter for surgical aftercare following surgery on the circulatory system: Secondary | ICD-10-CM

## 2014-09-09 MED ORDER — IOPAMIDOL (ISOVUE-300) INJECTION 61%
100.0000 mL | Freq: Once | INTRAVENOUS | Status: AC | PRN
  Administered 2014-09-09: 100 mL via INTRAVENOUS

## 2014-09-13 ENCOUNTER — Encounter: Payer: Self-pay | Admitting: Surgery

## 2014-09-16 ENCOUNTER — Encounter: Payer: Self-pay | Admitting: Surgery

## 2014-09-16 ENCOUNTER — Ambulatory Visit (INDEPENDENT_AMBULATORY_CARE_PROVIDER_SITE_OTHER): Payer: Self-pay | Admitting: Surgery

## 2014-09-16 VITALS — BP 136/61 | HR 78 | Temp 98.0°F | Resp 16 | Ht 67.0 in | Wt 191.2 lb

## 2014-09-16 DIAGNOSIS — I714 Abdominal aortic aneurysm, without rupture, unspecified: Secondary | ICD-10-CM

## 2014-09-16 NOTE — Addendum Note (Signed)
Addended by: Adria Dill L on: 09/16/2014 04:13 PM   Modules accepted: Orders

## 2014-09-16 NOTE — Progress Notes (Signed)
Patient name: Michael Wolfe MRN: [id number] DOB: [birth year] Sex: male     Chief Complaint  Patient presents with  . Routine Post Op    2-3 weeks. CT Scan last week S/P EVAR    HISTORY OF PRESENT ILLNESS:  the patient is back today for follow-up.  He is status post endovascular repair of abdominal aortic aneurysm on 08/22/2014. His aneurysm was 5.1 cm at the time of his repair.  Just prior to his repair he had undergone a TAVR  Procedure via a left groin exposure. His postoperative course was uncomplicated.  He is here today without significant complaints.  Past Medical History  Diagnosis Date  . Hypertension   . Coronary artery disease   . Hyperlipidemia   . Severe aortic stenosis 05/06/2014  . Constipation   . Type II diabetes mellitus   . Osteoarthritis   . Arthritis     "left shoulder" (07/04/2014)  . Depression   . Kidney stones X 1  . Inferior myocardial infarction 2003    /notes 06/25/2014  . Pneumonia 1935; 1990's X 1  . Anginal pain   . Shortness of breath dyspnea   . GERD (gastroesophageal reflux disease)   . History of hiatal hernia   . Headache     Past Surgical History  Procedure Laterality Date  . Coronary angiogram  09/12/2012    Procedure: CORONARY ANGIOGRAM;  Surgeon: Robynn Pane, MD;  Location: [facility] CATH LAB;  Service: Cardiovascular;;  . Left and right heart catheterization with coronary angiogram N/A 05/21/2014    Procedure: LEFT AND RIGHT HEART CATHETERIZATION WITH CORONARY ANGIOGRAM;  Surgeon: Rinaldo Cloud, MD;  Location: [facility] CATH LAB;  Service: Cardiovascular;  Laterality: N/A;  . Colonoscopy    . Lithotripsy    . Transcatheter aortic valve replacement, transfemoral N/A 07/02/2014    Procedure: TRANSCATHETER AORTIC VALVE REPLACEMENT, TRANSFEMORAL;  Surgeon: Tonny Bollman, MD;  Location: [facility] OR;  Service: Open Heart Surgery;  Laterality: N/A;  TAVR-TF APPROACH ( VALVE)  . Tee without cardioversion N/A 07/02/2014    Procedure: TRANSESOPHAGEAL  ECHOCARDIOGRAM (TEE);  Surgeon: Tonny Bollman, MD;  Location: [facility] OR;  Service: Open Heart Surgery;  Laterality: N/A;  . Cardiac valve replacement    . Cardiac catheterization  2003; 2016    Hattie Perch 06/25/2014  . Coronary angioplasty with stent placement  2009    Hattie Perch 06/25/2014  . Abdominal aortic endovascular stent graft N/A 08/22/2014    Procedure: ABDOMINAL AORTIC ENDOVASCULAR STENT GRAFT- GORE ;  Surgeon: Nada Libman, MD;  Location: [facility] OR;  Service: Vascular;  Laterality: N/A;    Social History   Social History  . Marital Status: Married    Spouse Name: N/A  . Number of Children: N/A  . Years of Education: N/A   Occupational History  . Not on file.   Social History Main Topics  . Smoking status: Never Smoker   . Smokeless tobacco: Never Used  . Alcohol Use: No  . Drug Use: No  . Sexual Activity: No   Other Topics Concern  . Not on file   Social History Narrative    Family History  Problem Relation Age of Onset  . Hypertension Father   . AAA (abdominal aortic aneurysm) Father   . Cancer Mother     Allergies as of 09/16/2014  . (No Known Allergies)    Current Outpatient Prescriptions on File Prior to Visit  Medication Sig Dispense Refill  . amLODipine (NORVASC) 5 MG  tablet Take 5 mg by mouth 2 (two) times daily.    Marland Kitchen aspirin 81 MG tablet Take 81 mg by mouth daily.    . clopidogrel (PLAVIX) 75 MG tablet Take 75 mg by mouth daily.    . famotidine (PEPCID) 20 MG tablet Take 1 tablet (20 mg total) by mouth 2 (two) times daily as needed for heartburn or indigestion.    . fish oil-omega-3 fatty acids 1000 MG capsule Take 3 g by mouth daily.     Marland Kitchen glimepiride (AMARYL) 2 MG tablet Take 2 mg by mouth daily.     Marland Kitchen lisinopril (PRINIVIL,ZESTRIL) 40 MG tablet Take 40 mg by mouth daily.    . metFORMIN (GLUCOPHAGE) 500 MG tablet Take 500-1,000 mg by mouth 2 (two) times daily with a meal. Takes 2 tablets in am and 1 tablet in pm    . metoprolol succinate (TOPROL-XL) 25 MG  24 hr tablet Take 1 tablet (25 mg total) by mouth daily. Take with or immediately following a meal. 30 tablet 11  . OVER THE COUNTER MEDICATION Take 1-2 tablets by mouth at bedtime as needed. For sleep. Equate brand Nighttime Sleep Aid    . oxyCODONE (OXY IR/ROXICODONE) 5 MG immediate release tablet Take 1 tablet (5 mg total) by mouth every 4 (four) hours as needed for moderate pain. 30 tablet 0  . rosuvastatin (CRESTOR) 20 MG tablet Take 20 mg by mouth daily.    . traMADol (ULTRAM) 50 MG tablet Take 1 tablet (50 mg total) by mouth every 6 (six) hours as needed. 30 tablet 0   No current facility-administered medications on file prior to visit.     REVIEW OF SYSTEMS:  negative  PHYSICAL EXAMINATION:   Vital signs are  Filed Vitals:   09/16/14 1504  BP: 136/61  Pulse: 78  Temp: 98 F (36.7 C)  TempSrc: Oral  Resp: 16  Height: 5\' 7"  (1.702 m)  Weight: 191 lb 3.2 oz (86.728 kg)  SpO2: 98%   Body mass index is 29.94 kg/(m^2). General: The patient appears their stated age. HEENT:  No gross abnormalities Pulmonary:  Non labored breathing Abdomen: Soft and non-tender.  No pulsatile mass Musculoskeletal: There are no major deformities. Neurologic: No focal weakness or paresthesias are detected, Skin:  Dry eschar at the  Superior aspect of the left vertical groin incision Psychiatric: The patient has normal affect. Cardiovascular: There is a regular rate and rhythm without significant murmur appreciated.   Diagnostic Studies  I have reviewed his CT scan with the following findings: 1. Patent infrarenal aortic stent graft with slight decrease of aneurysm sac diameter, 4.9 cm. 2. Type 2 endoleak related to lumbar branches. 3. 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 4. Ectatic internal iliac arteries, left 12 mm, right 13 mm.  Assessment:  Status post endovascular aneurysm repair Plan:  the patient is  doing very well.  There has been a slight interval decrease in the size of his aneurysm, now measuring 4.9 cm.  There is a small type II endoleak from a lumbar artery. There is also a short segment dissection in the right external iliac artery which is not flow limiting. From my perspective the patient is doing very well.  I did remove the eschar at the superior aspect of his incision and cleaned out this wound as well as remove a suture knot.  I did cauterize the inside of the wound with silver nitrate.  This  is a very small area and should heal with simple dressing changes.  Because of the type II endoleak I'm going to have him follow  Up in 6 months with a repeat CT scan.  Jorge Ny, M.D. [facility] Office: [phone] Pager:  [phone]

## 2014-10-24 ENCOUNTER — Telehealth: Payer: Self-pay

## 2014-10-24 NOTE — Telephone Encounter (Signed)
Pt. called to report intermittent drainage from left groin.  Stated "it's trying to scab-over."  Reported that the area was draining until about one week ago, and now looks yellow. Denied fever or chills.  Reported that he saw his PCP, and was told to call our office "because of possible infection."  Pt. unable to answer questions pertaining to appearance of the surrounding tissue.  Stated "I don't know, I'm not a doctor."  Discussed with Dr. Darrick Penna.  Recommended to schedule appt. for left groin check.  Appt. given @ 9:30 AM 10/25/14.  Pt. Verb. understanding.

## 2014-10-25 ENCOUNTER — Ambulatory Visit (INDEPENDENT_AMBULATORY_CARE_PROVIDER_SITE_OTHER): Payer: Self-pay | Admitting: Family

## 2014-10-25 ENCOUNTER — Encounter: Payer: Self-pay | Admitting: Family

## 2014-10-25 VITALS — BP 141/66 | HR 72 | Temp 97.2°F | Resp 16 | Ht 67.0 in | Wt 193.0 lb

## 2014-10-25 DIAGNOSIS — Z95828 Presence of other vascular implants and grafts: Secondary | ICD-10-CM

## 2014-10-25 DIAGNOSIS — T888XXD Other specified complications of surgical and medical care, not elsewhere classified, subsequent encounter: Principal | ICD-10-CM

## 2014-10-25 DIAGNOSIS — T792XXD Traumatic secondary and recurrent hemorrhage and seroma, subsequent encounter: Secondary | ICD-10-CM

## 2014-10-25 NOTE — Progress Notes (Signed)
    Post-operative EVAR   History of Present Illness  Michael Wolfe is a 81 y.o. male patient of Dr. Myra Gianotti who is status post endovascular repair of abdominal aortic aneurysm on 08/22/2014. His aneurysm was 5.1 cm at the time of his repair. Just prior to his repair he had undergone a TAVRprocedure via a left groin exposure.   Dr. Myra Gianotti last saw pt on 09/16/14. At that time there had been a slight interval decrease in the size of his aneurysm, measuring 4.9 cm by CT. There was a small type II endoleak from a lumbar artery. There was also a short segment dissection in the right external iliac artery which was not flow limiting. From Dr. Myra Gianotti perspective the patient is doing very well. At that visit Dr. Myra Gianotti removed the eschar at the superior aspect of his incision and cleaned out this wound as well as removed a suture knot. Dr. Myra Gianotti also cauterized the inside of the wound with silver nitrate. This was a very small area and was expected to heal with simple dressing changes. Because of the type II endoleak Dr. Myra Gianotti scheduled pt to followup in 6 months with a repeat CT scan; this will be due about February 2017. Pt returns today with c/o yellow drainage from his left groin for about 2 weeks, drainage stopped about a week ago, states Dr. Sharyn Lull suggested this be evaluated by [place].   The patient denies fever or chills, denies pain in his left groin.   The patient has not had back or abdominal pain.  Past Medical History, Past Surgical History, Social History, Family History, Medications, Allergies, and Review of Systems are unchanged from previous evaluation on 09/16/14.  For VQI Use Only  PRE-ADM LIVING: Home  AMB STATUS: Ambulatory  Physical Examination  Filed Vitals:   10/25/14 0925 10/25/14 0930  BP: 143/67 141/66  Pulse: 74 72  Temp: 97.2 F (36.2 C)   TempSrc: Oral   Resp: 16   Height:  (1.702 m)   Weight: 193 lb (87.544 kg)   SpO2: 98%    Body mass  index is 30.22 kg/(m^2). \ Vascular: Vessel Right Left  Aorta  Non-palpable N/A  Femoral 3+ Palpable 3+ Palpable  Popliteal  Non-palpable  Non-palpable  PT  Palpable  Palpable  DP  Palpable  Palpable   Gastrointestinal: soft, NTND, -G/R, - HSM, - palpable masses. Left groin incision has healed, no erythema, no drainage. There is moderate hyperplasia of scar tissue from the healed incision, no swelling. There is a pinhole sized opening in the left groin incision; when expressed no drainage emits.  The right groin site has healed well with no issues.    Medical Decision Making  Michael Wolfe is a 81 y.o. male who is s/p endovascular repair of abdominal aortic aneurysm on 08/22/2014. His aneurysm was 5.1 cm at the time of his repair. Just prior to his repair he had undergone a TAVRprocedure via a left groin exposure.  Dr. Imogene Burn spoke with pt and wife and examined pt. Pt has a seroma in his left groin which has spontaneously drained, stopped draining about a week ago, no indication of infection; this seroma should heal over time. Pt is already scheduled to return in February after CTA of EVAR and see Dr. Harrel Lemon, Carma Lair, RN, MSN, FNP-C [facility] Office: [phone]  10/25/2014, 9:25 AM  Clinic MD: Imogene Burn

## 2014-10-25 NOTE — Progress Notes (Signed)
Filed Vitals:   10/25/14 0925 10/25/14 0930  BP: 143/67 141/66  Pulse: 74 72  Temp: 97.2 F (36.2 C)   TempSrc: Oral   Resp: 16   Height:  (1.702 m)   Weight: 193 lb (87.544 kg)   SpO2: 98%

## 2014-11-20 ENCOUNTER — Telehealth (HOSPITAL_COMMUNITY): Payer: Self-pay | Admitting: Cardiac Rehabilitation

## 2014-11-20 NOTE — Telephone Encounter (Signed)
Pt declined cardiac rehab.  Pt unable to afford $50  copay

## 2015-03-03 ENCOUNTER — Other Ambulatory Visit: Payer: Self-pay | Admitting: Surgery

## 2015-03-03 LAB — CREATININE, SERUM: CREATININE: 1.73 mg/dL — AB (ref 0.70–1.11)

## 2015-03-14 ENCOUNTER — Other Ambulatory Visit: Payer: Self-pay | Admitting: *Deleted

## 2015-03-14 ENCOUNTER — Encounter: Payer: Self-pay | Admitting: *Deleted

## 2015-03-14 DIAGNOSIS — Z01812 Encounter for preprocedural laboratory examination: Secondary | ICD-10-CM

## 2015-03-14 DIAGNOSIS — Z9862 Peripheral vascular angioplasty status: Secondary | ICD-10-CM

## 2015-03-14 LAB — CREATININE, ISTAT: CREATININE, ISTAT: 1.5 mg/dL — AB (ref 0.6–1.3)

## 2015-03-14 NOTE — Progress Notes (Signed)
Patient ID: Michael Wolfe, male   DOB: Jun 15, 1931, 82 y.o.   MRN: [id number]   Faxed stat Creatinine to [facility] at [phone] Creat. 1.5  Drawn today at [facility] per request

## 2015-03-17 ENCOUNTER — Emergency Department (HOSPITAL_COMMUNITY)
Admission: EM | Admit: 2015-03-17 | Discharge: 2015-03-17 | Disposition: A | Discharge status: Home / Self Care | Payer: Medicare Other | Attending: Emergency Medicine | Admitting: Emergency Medicine

## 2015-03-17 ENCOUNTER — Ambulatory Visit
Admission: RE | Admit: 2015-03-17 | Discharge: 2015-03-17 | Disposition: A | Discharge status: Home / Self Care | Payer: Medicare Other | Source: Ambulatory Visit | Attending: Surgery | Admitting: Surgery

## 2015-03-17 ENCOUNTER — Encounter (HOSPITAL_COMMUNITY): Payer: Self-pay | Admitting: Emergency Medicine

## 2015-03-17 ENCOUNTER — Emergency Department (HOSPITAL_COMMUNITY): Payer: Medicare Other

## 2015-03-17 DIAGNOSIS — Z7984 Long term (current) use of oral hypoglycemic drugs: Secondary | ICD-10-CM | POA: Diagnosis not present

## 2015-03-17 DIAGNOSIS — Z9861 Coronary angioplasty status: Secondary | ICD-10-CM | POA: Insufficient documentation

## 2015-03-17 DIAGNOSIS — I25729 Atherosclerosis of autologous artery coronary artery bypass graft(s) with unspecified angina pectoris: Secondary | ICD-10-CM | POA: Insufficient documentation

## 2015-03-17 DIAGNOSIS — F329 Major depressive disorder, single episode, unspecified: Secondary | ICD-10-CM | POA: Insufficient documentation

## 2015-03-17 DIAGNOSIS — M7021 Olecranon bursitis, right elbow: Secondary | ICD-10-CM | POA: Insufficient documentation

## 2015-03-17 DIAGNOSIS — Z79899 Other long term (current) drug therapy: Secondary | ICD-10-CM | POA: Diagnosis not present

## 2015-03-17 DIAGNOSIS — Z87442 Personal history of urinary calculi: Secondary | ICD-10-CM | POA: Diagnosis not present

## 2015-03-17 DIAGNOSIS — M7981 Nontraumatic hematoma of soft tissue: Secondary | ICD-10-CM | POA: Insufficient documentation

## 2015-03-17 DIAGNOSIS — I10 Essential (primary) hypertension: Secondary | ICD-10-CM | POA: Insufficient documentation

## 2015-03-17 DIAGNOSIS — M25521 Pain in right elbow: Secondary | ICD-10-CM | POA: Diagnosis present

## 2015-03-17 DIAGNOSIS — Y9389 Activity, other specified: Secondary | ICD-10-CM | POA: Diagnosis not present

## 2015-03-17 DIAGNOSIS — Z7902 Long term (current) use of antithrombotics/antiplatelets: Secondary | ICD-10-CM | POA: Diagnosis not present

## 2015-03-17 DIAGNOSIS — Z8719 Personal history of other diseases of the digestive system: Secondary | ICD-10-CM | POA: Diagnosis not present

## 2015-03-17 DIAGNOSIS — M1990 Unspecified osteoarthritis, unspecified site: Secondary | ICD-10-CM | POA: Diagnosis not present

## 2015-03-17 DIAGNOSIS — I252 Old myocardial infarction: Secondary | ICD-10-CM | POA: Insufficient documentation

## 2015-03-17 DIAGNOSIS — I714 Abdominal aortic aneurysm, without rupture, unspecified: Secondary | ICD-10-CM

## 2015-03-17 DIAGNOSIS — E785 Hyperlipidemia, unspecified: Secondary | ICD-10-CM | POA: Insufficient documentation

## 2015-03-17 DIAGNOSIS — Z9889 Other specified postprocedural states: Secondary | ICD-10-CM | POA: Insufficient documentation

## 2015-03-17 DIAGNOSIS — Z8701 Personal history of pneumonia (recurrent): Secondary | ICD-10-CM | POA: Insufficient documentation

## 2015-03-17 LAB — CBC WITH DIFFERENTIAL/PLATELET
BASOS PCT: 1 %
Basophils Absolute: 0.1 10*3/uL (ref 0.0–0.1)
Eosinophils Absolute: 0.5 10*3/uL (ref 0.0–0.7)
Eosinophils Relative: 5 %
HEMATOCRIT: 39.4 % (ref 39.0–52.0)
HEMOGLOBIN: 12.7 g/dL — AB (ref 13.0–17.0)
Lymphocytes Relative: 29 %
Lymphs Abs: 2.5 10*3/uL (ref 0.7–4.0)
MCH: 29.7 pg (ref 26.0–34.0)
MCHC: 32.2 g/dL (ref 30.0–36.0)
MCV: 92.1 fL (ref 78.0–100.0)
MONO ABS: 0.8 10*3/uL (ref 0.1–1.0)
MONOS PCT: 9 %
Neutro Abs: 4.9 10*3/uL (ref 1.7–7.7)
Neutrophils Relative %: 56 %
Platelets: 189 10*3/uL (ref 150–400)
RBC: 4.28 MIL/uL (ref 4.22–5.81)
RDW: 14.7 % (ref 11.5–15.5)
WBC: 8.7 10*3/uL (ref 4.0–10.5)

## 2015-03-17 LAB — BASIC METABOLIC PANEL
Anion gap: 11 (ref 5–15)
BUN: 13 mg/dL (ref 6–20)
CALCIUM: 9.1 mg/dL (ref 8.9–10.3)
CO2: 23 mmol/L (ref 22–32)
CREATININE: 1.34 mg/dL — AB (ref 0.61–1.24)
Chloride: 105 mmol/L (ref 101–111)
GFR calc non Af Amer: 48 mL/min — ABNORMAL LOW (ref >60)
GFR, EST AFRICAN AMERICAN: 56 mL/min — AB (ref >60)
Glucose, Bld: 177 mg/dL — ABNORMAL HIGH (ref 65–99)
Potassium: 4.5 mmol/L (ref 3.5–5.1)
Sodium: 139 mmol/L (ref 135–145)

## 2015-03-17 MED ORDER — IOPAMIDOL (ISOVUE-370) INJECTION 76%
40.0000 mL | Freq: Once | INTRAVENOUS | Status: AC | PRN
  Administered 2015-03-17: 40 mL via INTRAVENOUS

## 2015-03-17 NOTE — Discharge Instructions (Signed)
Elbow Bursitis  Elbow bursitis is inflammation of the fluid-filled sac (bursa) between the tip of your elbow bone (olecranon) and your skin. Elbow bursitis may also be called olecranon bursitis.  Normally, the olecranon bursa has only a small amount of fluid in it to cushion and protect your elbow bone. Elbow bursitis causes fluid to build up inside the bursa. Over time, this swelling and inflammation can cause pain when you bend or lean on your elbow.   CAUSES  Elbow bursitis may be caused by:    Elbow injury (acute trauma).   Leaning on hard surfaces for long periods of time.   Infection from an injury that breaks the skin near your elbow.   A bone growth (spur) that forms at the tip of your elbow.   A medical condition that causes inflammation in your body, such as gout or rheumatoid arthritis.   The cause may also be unknown.   SIGNS AND SYMPTOMS   The first sign of elbow bursitis is usually swelling over the tip of your elbow. This can grow to be the size of a golf ball. This may start suddenly or develop gradually. You may also have:   Pain when bending or leaning on your elbow.   Restricted movement of your elbow.   If your bursitis is caused by an infection, symptoms may also include:   Redness, warmth, and tenderness of the elbow.   Drainage of pus from the swollen area over your elbow, if the skin breaks open.  DIAGNOSIS   Your health care provider may be able to diagnose elbow bursitis based on your signs and symptoms, especially if you have recently been injured. Your health care provider will also do a physical exam. This may include:   X-rays to look for a bone spur or a bone fracture.   Draining fluid from the bursa to test it for infection.   Blood tests to rule out gout or rheumatoid arthritis.  TREATMENT   Treatment for elbow bursitis depends on the cause. Treatment may include:   Medicines. These may include:    Over-the-counter medicines to relieve pain and inflammation.     Antibiotic medicines to fight infection.    Injections of anti-inflammatory medicines (steroids).   Wrapping your elbow with a bandage.   Draining fluid from the bursa.   Wearing elbow pads.   If your bursitis does not get better with treatment, surgery may be needed to remove the bursa.   HOME CARE INSTRUCTIONS    Take medicines only as directed by your health care provider.   If you were prescribed an antibiotic medicine, finish all of it even if you start to feel better.   If your bursitis is caused by an injury, rest your elbow and wear your bandage as directed by your health care provider. You may alsoapply ice to the injured area as directed by your health care provider:   Put ice in a plastic bag.   Place a towel between your skin and the bag.   Leave the ice on for 20 minutes, 2-3 times per day.   Avoid any activities that cause elbow pain.   Use elbow pads or elbow wraps to cushion your elbow.  SEEK MEDICAL CARE IF:   You have a fever.    Your symptoms do not get better with treatment.   Your pain or swelling gets worse.   Your elbow pain or swelling goes away and then returns.   You have   drainage of pus from the swollen area over your elbow.     This information is not intended to replace advice given to you by your health care provider. Make sure you discuss any questions you have with your health care provider.     Document Released: 02/10/2006 Document Revised: 02/01/2014 Document Reviewed: 09/19/2013  [employer]vier Interactive Patient Education 2016 [employer]

## 2015-03-17 NOTE — ED Notes (Addendum)
Pt from home for eval of right elbow pain and swelling, pt denies any injury but states had a procedure done with dye injected to left arm and right after he got home started to develop swelling and tenderness to elbow. Pt denies any sob, upon palpation of pt right elbow fluid noted to elbow. nad noted. Pt alert and oriented x4.

## 2015-03-18 NOTE — ED Provider Notes (Signed)
CSN: [id number]     Arrival date & time 03/17/15  1630 History   First MD Initiated Contact with Patient 03/17/15 2233     Chief Complaint  Patient presents with  . Elbow Pain      The history is provided by the patient.   patient presents with mild pain and some swelling in his right elbow. Began earlier today. Denies trauma. States he came in to have a CT scan done. Reviewing records appears to show that was to evaluate his AAA repair. Denies fever. Slight bruising on the elbow. No numbness or weakness. States it has gotten much smaller since it come to the ER and had to wait. Family was told he had sticks or rods in the blood and that's why he had to get the CT.  Past Medical History  Diagnosis Date  . Hypertension   . Coronary artery disease   . Hyperlipidemia   . Severe aortic stenosis 05/06/2014  . Constipation   . Type II diabetes mellitus (HCC)   . Osteoarthritis   . Arthritis     "left shoulder" (07/04/2014)  . Depression   . Kidney stones X 1  . Inferior myocardial infarction [facility]) 2003    Hattie Perch 06/25/2014  . Pneumonia 1935; 1990's X 1  . Anginal pain (HCC)   . Shortness of breath dyspnea   . GERD (gastroesophageal reflux disease)   . History of hiatal hernia   . Headache   . AAA (abdominal aortic aneurysm) [facility])    Past Surgical History  Procedure Laterality Date  . Coronary angiogram  09/12/2012    Procedure: CORONARY ANGIOGRAM;  Surgeon: Robynn Pane, MD;  Location: [facility] CATH LAB;  Service: Cardiovascular;;  . Left and right heart catheterization with coronary angiogram N/A 05/21/2014    Procedure: LEFT AND RIGHT HEART CATHETERIZATION WITH CORONARY ANGIOGRAM;  Surgeon: Rinaldo Cloud, MD;  Location: [facility] CATH LAB;  Service: Cardiovascular;  Laterality: N/A;  . Colonoscopy    . Lithotripsy    . Transcatheter aortic valve replacement, transfemoral N/A 07/02/2014    Procedure: TRANSCATHETER AORTIC VALVE REPLACEMENT, TRANSFEMORAL;  Surgeon: Tonny Bollman, MD;  Location:  [facility] OR;  Service: Open Heart Surgery;  Laterality: N/A;  TAVR-TF APPROACH ( VALVE)  . Tee without cardioversion N/A 07/02/2014    Procedure: TRANSESOPHAGEAL ECHOCARDIOGRAM (TEE);  Surgeon: Tonny Bollman, MD;  Location: [facility] OR;  Service: Open Heart Surgery;  Laterality: N/A;  . Cardiac valve replacement    . Cardiac catheterization  2003; 2016    Hattie Perch 06/25/2014  . Coronary angioplasty with stent placement  2009    Hattie Perch 06/25/2014  . Abdominal aortic endovascular stent graft N/A 08/22/2014    Procedure: ABDOMINAL AORTIC ENDOVASCULAR STENT GRAFT- GORE ;  Surgeon: Nada Libman, MD;  Location: [facility] OR;  Service: Vascular;  Laterality: N/A;   Family History  Problem Relation Age of Onset  . Hypertension Father   . AAA (abdominal aortic aneurysm) Father   . Cancer Mother    Social History  Substance Use Topics  . Smoking status: Never Smoker   . Smokeless tobacco: Never Used  . Alcohol Use: No    Review of Systems  Constitutional: Negative for appetite change.  Cardiovascular: Negative for chest pain.  Gastrointestinal: Negative for abdominal pain.  Musculoskeletal: Positive for joint swelling. Negative for myalgias and back pain.  Skin: Negative for color change and wound.      Allergies  Review of patient's allergies indicates no known allergies.  Home  Medications   Prior to Admission medications   Medication Sig Start Date End Date Taking? Authorizing Provider  amLODipine (NORVASC) 5 MG tablet Take 5 mg by mouth daily.    Yes Historical Provider, MD  clopidogrel (PLAVIX) 75 MG tablet Take 75 mg by mouth daily.   Yes Historical Provider, MD  glimepiride (AMARYL) 2 MG tablet Take 2 mg by mouth daily.    Yes Historical Provider, MD  lisinopril (PRINIVIL,ZESTRIL) 40 MG tablet Take 40 mg by mouth daily.   Yes Historical Provider, MD  metFORMIN (GLUCOPHAGE) 500 MG tablet Take 500 mg by mouth daily with breakfast.    Yes Historical Provider, MD  naproxen sodium (ANAPROX) 220 MG  tablet Take 220 mg by mouth 2 (two) times daily as needed (for pain).   Yes Historical Provider, MD  OVER THE COUNTER MEDICATION Take 1-2 tablets by mouth at bedtime as needed (for sleep). For sleep. Equate brand Nighttime Sleep Aid   Yes Historical Provider, MD  rosuvastatin (CRESTOR) 20 MG tablet Take 20 mg by mouth daily.   Yes Historical Provider, MD  famotidine (PEPCID) 20 MG tablet Take 1 tablet (20 mg total) by mouth 2 (two) times daily as needed for heartburn or indigestion. 07/05/14   Rhonda G Barrett, PA-C  metoprolol succinate (TOPROL-XL) 25 MG 24 hr tablet Take 1 tablet (25 mg total) by mouth daily. Take with or immediately following a meal. 07/05/14   Rhonda G Barrett, PA-C  oxyCODONE (OXY IR/ROXICODONE) 5 MG immediate release tablet Take 1 tablet (5 mg total) by mouth every 4 (four) hours as needed for moderate pain. 08/23/14   Lars Mage, PA-C  traMADol (ULTRAM) 50 MG tablet Take 1 tablet (50 mg total) by mouth every 6 (six) hours as needed. 08/25/14   Lars Mage, PA-C   BP 151/73 mmHg  Pulse 59  Temp(Src) 97.6 F (36.4 C) (Oral)  Resp 16  SpO2 98% Physical Exam  Constitutional: He appears well-developed.  Cardiovascular: Normal rate.   Pulmonary/Chest: Effort normal.  Abdominal: Soft. There is no tenderness.  Musculoskeletal: He exhibits edema.  Mild edema bilateral hands. There is a fluctuant area over the right elbow posteriorly. No tenderness. Minimal ecchymosis medially on swelling.  Neurological: He is alert.  Skin: Skin is warm.    ED Course  Procedures (including critical care time) Labs Review Labs Reviewed  CBC WITH DIFFERENTIAL/PLATELET - Abnormal; Notable for the following:    Hemoglobin 12.7 (*)    All other components within normal limits  BASIC METABOLIC PANEL - Abnormal; Notable for the following:    Glucose, Bld 177 (*)    Creatinine, Ser 1.34 (*)    GFR calc non Af Amer 48 (*)    GFR calc Af Amer 56 (*)    All other components within normal  limits    Imaging Review Dg Elbow Complete Right  03/17/2015  CLINICAL DATA:  82 year old with acute onset of pain, tenderness and swelling involving the left elbow which began earlier today. No known injury. EXAM: RIGHT ELBOW - COMPLETE 3+ VIEW COMPARISON:  None. FINDINGS: Marked enlargement of the olecranon bursa with edema in the adjacent subcutaneous fat. No evidence of acute or subacute fracture or dislocation. Mild humeroulnar joint space narrowing. Large olecranon spur. Well preserved bone mineral density. Incompletely united apophysis for the medial epicondyle. Likely small dystrophic calcifications adjacent to the lateral epicondyle. IMPRESSION: 1. Severe olecranon bursitis. 2. Mild osteoarthritis. Electronically Signed   By: Kayren Eaves.D.  On: 03/17/2015 17:21   Ct Angio Abd/pel W/ And/or W/o  03/17/2015  CLINICAL DATA:  Post EVAR 08/22/14. Hx renal stones w/ hematuria. No other symptoms. No other surg or hx ca. Prev in pacs. EXAM: CT ANGIOGRAPHY ABDOMEN AND PELVIS TECHNIQUE: Multidetector CT imaging of the abdomen and pelvis was performed using the standard protocol during bolus administration of intravenous contrast. Multiplanar reconstructed images including MIPs were obtained and reviewed to evaluate the vascular anatomy. CONTRAST:  40 mL Isovue 370 IV, reduced dosage due to elevated creatinine 1.5, GFR 43 COMPARISON:  COMPARISON 09/09/2014 and previous FINDINGS: ARTERIAL FINDINGS: Aorta: Scattered nonocclusive calcified plaque in the visualized mildly tortuous distal descending thoracic segment and suprarenal segment. Patent bifurcated infrarenal stent graft. The type 2 endoleak seen previously is no longer evident. Native sac diameter 5.3 x 4.5 cm, previously 4.9 x 4.8. Celiac axis:          Patent Superior mesenteric:  Patent Left renal:           Duplicated, inferior dominant, patent Right renal:          Duplicated, superior dominant, patent Inferior mesenteric: Short segment  origin occlusion, reconstituted distally by visceral collaterals. Left iliac: Left limb extends to the distal common iliac, well apposed. Ectatic atheromatous internal iliac up to 12 mm diameter. Patent external iliac. Right iliac: Right limb of the stent graft extends to the mid common iliac, well apposed. Ectatic internal iliac up to 12 mm diameter. Patent mildly tortuous external iliac ; the nonocclusive dissection previously seen in its midportion is much less conspicuous. Venous findings:      Dedicated venous phase imaging not obtained. Review of the MIP images confirms the above findings. Nonvascular findings: Lower chest: Minimal dependent atelectasis. Previous AVR. Patchy coronary calcifications. Hepatobiliary: Multiple subcentimeter partially calcified layering stones in the nondilated gallbladder. Liver negative. Pancreas: No mass, inflammatory changes, or other significant abnormality. Spleen: Within normal limits in size and appearance. Small accessory splenule. Adrenals/Urinary Tract: No masses identified. No evidence of hydronephrosis. Stomach/Bowel: No evidence of obstruction, inflammatory process, or abnormal fluid collections. Normal appendix. Scattered sigmoid diverticula. Lymphatic: No pathologically enlarged lymph nodes. Reproductive: No mass or other significant abnormality. Other: No ascites.  No free air. Musculoskeletal: Stable L1 and L2 compression deformities. Mild L4 superior endplate compression fracture deformity with approximately 30% loss of height anteriorly, new since previous scan ; no retropulsion or posterior element involvement. IMPRESSION: 1. Patent infrarenal aortic stent graft, with no endoleak evident, some interval change in excluded aneurysm sac morphology now measuring 5.3 x 4.5 cm maximum transverse dimensions, previously 4.9 x 4.8. 2. Ectatic bilateral internal iliac arteries, 12 mm maximum diameter. 3. Subacute L4 compression fracture deformity, new since 09/09/2014.  4. Cholelithiasis. Electronically Signed   By: Corlis Leak M.D.   On: 03/17/2015 17:21   I have personally reviewed and evaluated these images and lab results as part of my medical decision-making.   EKG Interpretation None      MDM   Final diagnoses:  Olecranon bursitis of right elbow    Patient with right elbow swelling. Appears to be olecranon bursitis. It does not appear to be infected. There is no redness or tenderness in his been shrinking these come to the ER. I doubt this infection. At this point does not think the fluid collection from the bursa would be worth the risk. Will have follow-up with Ortho as needed.    Benjiman Core, MD 03/18/15 [phone]

## 2015-03-19 ENCOUNTER — Encounter: Payer: Self-pay | Admitting: Surgery

## 2015-03-24 ENCOUNTER — Ambulatory Visit (INDEPENDENT_AMBULATORY_CARE_PROVIDER_SITE_OTHER): Payer: Medicare Other | Admitting: Surgery

## 2015-03-24 ENCOUNTER — Encounter: Payer: Self-pay | Admitting: Surgery

## 2015-03-24 VITALS — BP 126/71 | HR 76 | Ht 67.0 in | Wt 195.0 lb

## 2015-03-24 DIAGNOSIS — I714 Abdominal aortic aneurysm, without rupture, unspecified: Secondary | ICD-10-CM

## 2015-03-24 NOTE — Addendum Note (Signed)
Addended by: Adria Dill L on: 03/24/2015 03:57 PM   Modules accepted: Orders

## 2015-03-24 NOTE — Progress Notes (Signed)
Patient name: Michael Wolfe MRN: [id number] DOB: 25-Jul-1931 Sex: male     Chief Complaint  Patient presents with  . Re-evaluation    6 month f/u CTA abd/pelvis prior    HISTORY OF PRESENT ILLNESS: the patient is back today for follow-up. He is status post endovascular repair of abdominal aortic aneurysm on 08/22/2014. His aneurysm was 5.1 cm at the time of his repair. Just prior to his repair he had undergone a TAVR Procedure via a left groin exposure. His 1 month follow-up CT scan showed a slight decrease in the size of his aneurysm, measuring 4.9 cm. 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  He does have some bruising on his right arm which he is seeing his family doctor for.  Past Medical History  Diagnosis Date  . Hypertension   . Coronary artery disease   . Hyperlipidemia   . Severe aortic stenosis 05/06/2014  . Constipation   . Type II diabetes mellitus (HCC)   . Osteoarthritis   . Arthritis     "left shoulder" (07/04/2014)  . Depression   . Kidney stones X 1  . Inferior myocardial infarction [facility]) 2003    Hattie Perch 06/25/2014  . Pneumonia 1935; 1990's X 1  . Anginal pain (HCC)   . Shortness of breath dyspnea   . GERD (gastroesophageal reflux disease)   . History of hiatal hernia   . Headache   . AAA (abdominal aortic aneurysm) [facility])     Past Surgical History  Procedure Laterality Date  . Coronary angiogram  09/12/2012    Procedure: CORONARY ANGIOGRAM;  Surgeon: Robynn Pane, MD;  Location: [facility] CATH LAB;  Service: Cardiovascular;;  . Left and right heart catheterization with coronary angiogram N/A 05/21/2014    Procedure: LEFT AND RIGHT HEART CATHETERIZATION WITH CORONARY ANGIOGRAM;  Surgeon: Rinaldo Cloud, MD;  Location: [facility] CATH LAB;  Service: Cardiovascular;  Laterality: N/A;  . Colonoscopy    . Lithotripsy    .  Transcatheter aortic valve replacement, transfemoral N/A 07/02/2014    Procedure: TRANSCATHETER AORTIC VALVE REPLACEMENT, TRANSFEMORAL;  Surgeon: Tonny Bollman, MD;  Location: [facility] OR;  Service: Open Heart Surgery;  Laterality: N/A;  TAVR-TF APPROACH ( VALVE)  . Tee without cardioversion N/A 07/02/2014    Procedure: TRANSESOPHAGEAL ECHOCARDIOGRAM (TEE);  Surgeon: Tonny Bollman, MD;  Location: [facility] OR;  Service: Open Heart Surgery;  Laterality: N/A;  . Cardiac valve replacement    . Cardiac catheterization  2003; 2016    Hattie Perch 06/25/2014  . Coronary angioplasty with stent placement  2009    Hattie Perch 06/25/2014  . Abdominal aortic endovascular stent graft N/A 08/22/2014    Procedure: ABDOMINAL AORTIC ENDOVASCULAR STENT GRAFT- GORE ;  Surgeon: Nada Libman, MD;  Location: [facility] OR;  Service: Vascular;  Laterality: N/A;    Social History   Social History  . Marital Status: Married    Spouse Name: N/A  . Number of Children: N/A  . Years of Education: N/A   Occupational History  . Not on file.   Social History Main Topics  . Smoking status: Never Smoker   . Smokeless tobacco: Never Used  . Alcohol Use: No  . Drug Use: No  . Sexual Activity: No   Other Topics Concern  . Not on file   Social History Narrative  Family History  Problem Relation Age of Onset  . Hypertension Father   . AAA (abdominal aortic aneurysm) Father   . Cancer Mother     Allergies as of 03/24/2015  . (No Known Allergies)    Current Outpatient Prescriptions on File Prior to Visit  Medication Sig Dispense Refill  . amLODipine (NORVASC) 5 MG tablet Take 5 mg by mouth daily.     . clopidogrel (PLAVIX) 75 MG tablet Take 75 mg by mouth daily.    . famotidine (PEPCID) 20 MG tablet Take 1 tablet (20 mg total) by mouth 2 (two) times daily as needed for heartburn or indigestion.    Marland Kitchen glimepiride (AMARYL) 2 MG tablet Take 2 mg by mouth daily.     Marland Kitchen lisinopril (PRINIVIL,ZESTRIL) 40 MG tablet Take 40 mg by mouth  daily.    . metFORMIN (GLUCOPHAGE) 500 MG tablet Take 500 mg by mouth daily with breakfast.     . metoprolol succinate (TOPROL-XL) 25 MG 24 hr tablet Take 1 tablet (25 mg total) by mouth daily. Take with or immediately following a meal. 30 tablet 11  . naproxen sodium (ANAPROX) 220 MG tablet Take 220 mg by mouth 2 (two) times daily as needed (for pain).    Marland Kitchen OVER THE COUNTER MEDICATION Take 1-2 tablets by mouth at bedtime as needed (for sleep). For sleep. Equate brand Nighttime Sleep Aid    . oxyCODONE (OXY IR/ROXICODONE) 5 MG immediate release tablet Take 1 tablet (5 mg total) by mouth every 4 (four) hours as needed for moderate pain. 30 tablet 0  . rosuvastatin (CRESTOR) 20 MG tablet Take 20 mg by mouth daily.    . traMADol (ULTRAM) 50 MG tablet Take 1 tablet (50 mg total) by mouth every 6 (six) hours as needed. 30 tablet 0   No current facility-administered medications on file prior to visit.     REVIEW OF SYSTEMS: Cardiovascular: No chest pain, chest pressure, palpitations, orthopnea, or dyspnea on exertion. No claudication or rest pain,  No history of DVT or phlebitis. Pulmonary: No productive cough, asthma or wheezing. Neurologic: No weakness, paresthesias, aphasia, or amaurosis. No dizziness. Hematologic: No bleeding problems or clotting disorders. Musculoskeletal: No joint pain or joint swelling. Gastrointestinal: No blood in stool or hematemesis Genitourinary: No dysuria or hematuria. Psychiatric:: No history of major depression. Integumentary: ecchymosis to the right forearm Constitutional: No fever or chills.  PHYSICAL EXAMINATION:   Vital signs are  Filed Vitals:   03/24/15 1318  Height:  (1.702 m)  Weight: 195 lb (88.451 kg)   Body mass index is 30.53 kg/(m^2). General: The patient appears their stated age. HEENT:  No gross abnormalities Pulmonary:  Non labored breathing Abdomen: Soft and non-tender Musculoskeletal: There are no major deformities. Neurologic:  No focal weakness or paresthesias are detected, Skin: There are no ulcer or rashes noted. Psychiatric: The patient has normal affect. Cardiovascular: There is a regular rate and rhythm without significant murmur appreciated.   Diagnostic Studies  I have reviewed his CT scan. Maximum diameter was 5.3 cm.  No dissection seen in the external iliac artery.  No endoleak identified.  Assessment:  status post endovascular aneurysm repair Plan:  the patient had a small type II endoleak on his initial CT scan.  No endoleak is seen today, however there has been a slight increase in the size of his aneurysm. I have recommended continued surveillance.  He will have another CT scan in 6 months. The non-flow limiting dissection within the right  external iliac artery was not visualized today.  Jorge Ny, M.D. [facility] Office: [phone] Pager:  [phone]

## 2015-05-08 ENCOUNTER — Other Ambulatory Visit: Payer: Self-pay

## 2015-05-08 DIAGNOSIS — Z952 Presence of prosthetic heart valve: Secondary | ICD-10-CM

## 2015-05-08 DIAGNOSIS — I350 Nonrheumatic aortic (valve) stenosis: Secondary | ICD-10-CM

## 2015-07-04 ENCOUNTER — Other Ambulatory Visit: Payer: Self-pay

## 2015-07-04 ENCOUNTER — Encounter: Payer: Self-pay | Admitting: Cardiovascular Disease

## 2015-07-04 ENCOUNTER — Ambulatory Visit (INDEPENDENT_AMBULATORY_CARE_PROVIDER_SITE_OTHER): Payer: Medicare Other | Admitting: Cardiovascular Disease

## 2015-07-04 ENCOUNTER — Ambulatory Visit (HOSPITAL_COMMUNITY): Payer: Medicare Other | Attending: Cardiology

## 2015-07-04 VITALS — BP 142/78 | HR 72 | Ht 67.0 in | Wt 195.0 lb

## 2015-07-04 DIAGNOSIS — Z953 Presence of xenogenic heart valve: Secondary | ICD-10-CM | POA: Insufficient documentation

## 2015-07-04 DIAGNOSIS — E119 Type 2 diabetes mellitus without complications: Secondary | ICD-10-CM | POA: Insufficient documentation

## 2015-07-04 DIAGNOSIS — I119 Hypertensive heart disease without heart failure: Secondary | ICD-10-CM | POA: Diagnosis not present

## 2015-07-04 DIAGNOSIS — E785 Hyperlipidemia, unspecified: Secondary | ICD-10-CM | POA: Insufficient documentation

## 2015-07-04 DIAGNOSIS — Z952 Presence of prosthetic heart valve: Secondary | ICD-10-CM

## 2015-07-04 DIAGNOSIS — I350 Nonrheumatic aortic (valve) stenosis: Secondary | ICD-10-CM | POA: Diagnosis present

## 2015-07-04 DIAGNOSIS — I2510 Atherosclerotic heart disease of native coronary artery without angina pectoris: Secondary | ICD-10-CM | POA: Diagnosis not present

## 2015-07-04 DIAGNOSIS — Z954 Presence of other heart-valve replacement: Secondary | ICD-10-CM | POA: Diagnosis not present

## 2015-07-04 LAB — ECHOCARDIOGRAM COMPLETE
AO mean calculated velocity dopler: 191 cm/s
AOVTI: 55.2 cm
AV Mean grad: 17 mmHg
AV Peak grad: 31 mmHg
AV pk vel: 280 cm/s
AVCELMEANRAT: 0.44
Ao pk vel: 0.4 m/s
CHL CUP MV DEC (S): 225
E decel time: 225 msec
E/e' ratio: 15.4
FS: 44 % (ref 28–44)
IVS/LV PW RATIO, ED: 0.97
LA ID, A-P, ES: 47 mm
LA vol index: 12.6 mL/m2
LA vol: 26 mL
LADIAMINDEX: 2.27 cm/m2
LAVOLA4C: 24 mL
LEFT ATRIUM END SYS DIAM: 47 mm
LV E/e' medial: 15.4
LV E/e'average: 15.4
LV TDI E'MEDIAL: 4.5
LV e' LATERAL: 5.48 cm/s
LVOT VTI: 24 cm
LVOT peak grad rest: 5 mmHg
LVOT peak vel: 111 cm/s
LVOTVTI: 0.43 cm
MV Peak grad: 3 mmHg
MV pk A vel: 126 m/s
MV pk E vel: 84.4 m/s
PW: 14.4 mm — AB (ref 0.6–1.1)
S' Lateral: 12.2 cm/s
TDI e' lateral: 5.48

## 2015-07-04 NOTE — Patient Instructions (Signed)
Medication Instructions:  Your physician recommends that you continue on your current medications as directed. Please refer to the Current Medication list given to you today.  Labwork: No new orders.   Testing/Procedures: No new orders.   Follow-Up: Your physician recommends that you continue routine cardiology follow-up with Dr Sharyn LullHarwani.  Any Other Special Instructions Will Be Listed Below (If Applicable).     If you need a refill on your cardiac medications before your next appointment, please call your pharmacy.

## 2015-07-04 NOTE — Progress Notes (Signed)
Cardiology Office Note Date:  07/04/2015   ID:  Michael Wolfe, DOB 01/26/1931, MRN [id number]  PCP:  Rinaldo CloudHarwani, Mohan, MD  Cardiologist:  Dr Sharyn LullHarwani  Chief Complaint  Patient presents with  . Follow-up    1 YEAR TAVR FOLLOW_UP     History of Present Illness: Michael Wolfe is a 82 y.o. male who presents for 1 year TAVR follow-up. The patient developed severe aortic stenosis and he was treated with TAVR from an open left transfemoral approach using a 26 mm Edwards's Sapien 3 transcatheter heart valve. The patient had an uncomplicated hospital course.  He is doing fairly well. The patient is here with his wife today. He is somewhat of a difficult historian because of tangential thinking. He denies chest pain or shortness of breath. He denies orthopnea, PND, or syncope.   Past Medical History  Diagnosis Date  . Hypertension   . Coronary artery disease   . Hyperlipidemia   . Severe aortic stenosis 05/06/2014  . Constipation   . Type II diabetes mellitus (HCC)   . Osteoarthritis   . Arthritis     "left shoulder" (07/04/2014)  . Depression   . Kidney stones X 1  . Inferior myocardial infarction [facility](HCC) 2003    Hattie Perch/notes 06/25/2014  . Pneumonia 1935; 1990's X 1  . Anginal pain (HCC)   . Shortness of breath dyspnea   . GERD (gastroesophageal reflux disease)   . History of hiatal hernia   . Headache   . AAA (abdominal aortic aneurysm) [facility](HCC)     Past Surgical History  Procedure Laterality Date  . Coronary angiogram  09/12/2012    Procedure: CORONARY ANGIOGRAM;  Surgeon: Robynn PaneMohan N Harwani, MD;  [facility];  Service: Cardiovascular;;  . Left and right heart catheterization with coronary angiogram N/A 05/21/2014    Procedure: LEFT AND RIGHT HEART CATHETERIZATION WITH CORONARY ANGIOGRAM;  Surgeon: Rinaldo CloudMohan Harwani, MD;  [facility];  Service: Cardiovascular;  Laterality: N/A;  . Colonoscopy    . Lithotripsy    . Transcatheter aortic valve replacement, transfemoral N/A  07/02/2014    Procedure: TRANSCATHETER AORTIC VALVE REPLACEMENT, TRANSFEMORAL;  Surgeon: Tonny BollmanMichael Janazia Schreier, MD;  [facility];  Service: Open Heart Surgery;  Laterality: N/A;  TAVR-TF APPROACH (23MM VALVE)  . Tee without cardioversion N/A 07/02/2014    Procedure: TRANSESOPHAGEAL ECHOCARDIOGRAM (TEE);  Surgeon: Tonny BollmanMichael Queena Monrreal, MD;  [facility];  Service: Open Heart Surgery;  Laterality: N/A;  . Cardiac valve replacement    . Cardiac catheterization  2003; 2016    Hattie Perch/notes 06/25/2014  . Coronary angioplasty with stent placement  2009    Hattie Perch/notes 06/25/2014  . Abdominal aortic endovascular stent graft N/A 08/22/2014    Procedure: ABDOMINAL AORTIC ENDOVASCULAR STENT GRAFT- GORE ;  Surgeon: Nada LibmanVance W Brabham, MD;  [facility];  Service: Vascular;  Laterality: N/A;    Current Outpatient Prescriptions  Medication Sig Dispense Refill  . acetaminophen (TYLENOL) 500 MG tablet     . amLODipine (NORVASC) 5 MG tablet Take 5 mg by mouth daily.     . clopidogrel (PLAVIX) 75 MG tablet Take 75 mg by mouth daily.    Marland Kitchen. glimepiride (AMARYL) 2 MG tablet Take 2 mg by mouth daily.     Marland Kitchen. lisinopril (PRINIVIL,ZESTRIL) 40 MG tablet Take 40 mg by mouth daily.    . metFORMIN (GLUCOPHAGE) 500 MG tablet Take 500 mg by mouth daily with breakfast.     . naproxen sodium (ANAPROX) 220 MG  tablet Take 220 mg by mouth 2 (two) times daily as needed (for pain).    Marland Kitchen OVER THE COUNTER MEDICATION Take 1-2 tablets by mouth at bedtime as needed (for sleep). For sleep. Equate brand Nighttime Sleep Aid    . rosuvastatin (CRESTOR) 20 MG tablet Take 20 mg by mouth daily.     No current facility-administered medications for this visit.    Allergies:   Review of patient's allergies indicates no known allergies.   Social History:  The patient  reports that he has never smoked. He has never used smokeless tobacco. He reports that he does not drink alcohol or use illicit drugs.   Family History:  The patient's family history includes AAA  (abdominal aortic aneurysm) in his father; Cancer in his mother; Hypertension in his father.    ROS:  Please see the history of present illness.   All other systems are reviewed and negative.    PHYSICAL EXAM: VS:  BP 142/78 mmHg  Pulse 72  Ht 5\' 7"  (1.702 m)  Wt 195 lb (88.451 kg)  BMI 30.53 kg/m2 , BMI Body mass index is 30.53 kg/(m^2). GEN: Well nourished, well developed, in no acute distress HEENT: normal Neck: no JVD, no masses. bilateral carotid bruits Cardiac: RRR with 2/6 SEM at the upper sternal border        Respiratory:  clear to auscultation bilaterally, normal work of breathing GI: soft, nontender, nondistended, + BS MS: no deformity or atrophy Ext: no pretibial edema Skin: warm and dry, no rash Neuro:  Strength and sensation are intact Psych: euthymic mood, full affect  EKG:  EKG is not ordered today.  Recent Labs: 08/15/2014: ALT 13* 08/22/2014: Magnesium 1.7 03/17/2015: BUN 13; Creatinine, Ser 1.34*; Hemoglobin 12.7*; Platelets 189; Potassium 4.5; Sodium 139   Lipid Panel     Component Value Date/Time   CHOL * 08/31/2007 0340    266        ATP III CLASSIFICATION:  <200     mg/dL   Desirable  161-096  mg/dL   Borderline High  >=045    mg/dL   High   TRIG 409* 81/19/1478 0340   HDL 36* 08/31/2007 0340   CHOLHDL 7.4 08/31/2007 0340   VLDL UNABLE TO CALCULATE IF TRIGLYCERIDE OVER 400 mg/dL 29/56/2130 8657   LDLCALC  08/31/2007 0340    UNABLE TO CALCULATE IF TRIGLYCERIDE OVER 400 mg/dL        Total Cholesterol/HDL:CHD Risk Coronary Heart Disease Risk Table                     Men   Women  1/2 Average Risk   3.4   3.3      Wt Readings from Last 3 Encounters:  07/04/15 195 lb (88.451 kg)  03/24/15 195 lb (88.451 kg)  10/25/14 193 lb (87.544 kg)     Cardiac Studies Reviewed: 2-D echo performed this morning is currently pending. I have personally reviewed the images. LV function appears normal. The peak and mean transvalvular gradients across the aortic  valve are 30 and 18 mmHg, respectively. This is unchanged from the patient's previous study when his mean gradient was 20 mmHg. There is no paravalvular regurgitation.  ASSESSMENT AND PLAN: 1. Aortic valve disorder now one year out from TAVR: The patient has New York Heart Association functional class I symptoms. He is doing very well. His echo is reviewed demonstrating normal valve function.  2. Coronary artery disease, native vessel: No symptoms of  angina. Followed by Dr. Sharyn Lull.  I appreciate the opportunity to participate in the care of Michael Wolfe. He will continue to follow with Dr. Sharyn Lull. I would be happy to see him in the future if any problems arise.  Current medicines are reviewed with the patient today.  The patient does not have concerns regarding medicines.  Labs/ tests ordered today include:  No orders of the defined types were placed in this encounter.   Enzo Bi, MD  07/04/2015 12:44 PM    [facility] [address] [place], [place], [place]  [address] Phone: [phone]; Fax: [phone]

## 2016-01-12 ENCOUNTER — Telehealth: Payer: Self-pay | Admitting: Surgery

## 2016-01-12 NOTE — Telephone Encounter (Signed)
Left message for patient's son to see if he wanted to schedule a follow up appointment for his father.

## 2016-02-23 ENCOUNTER — Telehealth: Payer: Self-pay

## 2016-02-23 NOTE — Telephone Encounter (Signed)
Pts wife called to scheduled a f/u appt for pt. Appt was scheduled with pts wife for first available, wife stated that pt was in pain. I transferred pts wife to Triage for concern of pain.

## 2016-03-15 ENCOUNTER — Encounter: Payer: Self-pay | Admitting: Cardiology

## 2016-03-22 ENCOUNTER — Other Ambulatory Visit: Payer: Self-pay

## 2016-03-22 ENCOUNTER — Telehealth: Payer: Self-pay | Admitting: Surgery

## 2016-03-22 NOTE — Telephone Encounter (Signed)
Called and spoke to patient's wife regarding missed CTA appt for today 03/22/16.  She stated they were not aware of this appt. I rescheduled the appt to 03/29/16 at 12:30pm w/ [address] [facility]. She knows they are to arrive at 12 noon and no solid foods 4 hrs prior. The appt w/ [facility] is at 2:15 on 03/29/16. Pt's wife voiced understanding. awt

## 2016-03-23 ENCOUNTER — Encounter: Payer: Self-pay | Admitting: Surgery

## 2016-03-29 ENCOUNTER — Ambulatory Visit (INDEPENDENT_AMBULATORY_CARE_PROVIDER_SITE_OTHER): Payer: Medicare Other | Admitting: Surgery

## 2016-03-29 ENCOUNTER — Encounter: Payer: Self-pay | Admitting: Surgery

## 2016-03-29 ENCOUNTER — Ambulatory Visit
Admission: RE | Admit: 2016-03-29 | Discharge: 2016-03-29 | Disposition: A | Discharge status: Home / Self Care | Payer: Medicare Other | Source: Ambulatory Visit | Attending: Surgery | Admitting: Surgery

## 2016-03-29 VITALS — BP 122/66 | HR 100 | Temp 97.3°F | Resp 22 | Ht 67.0 in | Wt 193.0 lb

## 2016-03-29 DIAGNOSIS — I714 Abdominal aortic aneurysm, without rupture, unspecified: Secondary | ICD-10-CM

## 2016-03-29 MED ORDER — IOPAMIDOL (ISOVUE-370) INJECTION 76%
75.0000 mL | Freq: Once | INTRAVENOUS | Status: AC | PRN
  Administered 2016-03-29: 75 mL via INTRAVENOUS

## 2016-03-29 NOTE — Progress Notes (Signed)
[facility]ist of [place]  Patient name: Michael Wolfe MRN: [id number] DOB: Nov 25, 1931 Sex: male   REASON FOR VISIT:    Follow up EVAR  HISOTRY OF PRESENT ILLNESS:    Michael Wolfe is a 83 y.o. male who returns today for follow-up.  On 08/22/2014 he underwent endovascular repair of a infrarenal abdominal aortic aneurysm.  Aneurysm diameter at that time was 5.1 cm.  He is status post have her via a left femoral approach just prior to his aneurysm repair.  At his 1 month follow-up scan the aneurysm sac measures 4.9 cm.  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  At his follow-up in February 2017, the CT scan showed a maximum diameter of 5.3 cm.  No endoleak was visualized.  There was also no evidence of dissection in his external iliac artery.  I have not seen the patient since that visit, approximate one year.  He comes back today for for follow-up without complaints.   PAST MEDICAL HISTORY:   Past Medical History:  Diagnosis Date  . AAA (abdominal aortic aneurysm) (HCC)   . Anginal pain (HCC)   . Arthritis    "left shoulder" (07/04/2014)  . Constipation   . Coronary artery disease   . Depression   . GERD (gastroesophageal reflux disease)   . Headache   . History of hiatal hernia   . Hyperlipidemia   . Hypertension   . Inferior myocardial infarction [facility](HCC) 2003   Hattie Perch/notes 06/25/2014  . Kidney stones X 1  . Osteoarthritis   . Pneumonia 1935; 1990's X 1  . Severe aortic stenosis 05/06/2014  . Shortness of breath dyspnea   . Type II diabetes mellitus (HCC)      FAMILY HISTORY:   Family History  Problem Relation Age of Onset  . Hypertension Father   . AAA (abdominal aortic aneurysm) Father   . Cancer Mother     SOCIAL HISTORY:   Social History  Substance Use Topics  . Smoking status: Never Smoker  . Smokeless tobacco: Never Used  . Alcohol use No      ALLERGIES:   No Known Allergies   CURRENT MEDICATIONS:   Current Outpatient Prescriptions  Medication Sig Dispense Refill  . acetaminophen (TYLENOL) 500 MG tablet     . amLODipine (NORVASC) 5 MG tablet Take 5 mg by mouth daily.     . clopidogrel (PLAVIX) 75 MG tablet Take 75 mg by mouth daily.    Marland Kitchen. glimepiride (AMARYL) 2 MG tablet Take 2 mg by mouth daily.     Marland Kitchen. lisinopril (PRINIVIL,ZESTRIL) 40 MG tablet Take 40 mg by mouth daily.    . metFORMIN (GLUCOPHAGE) 500 MG tablet Take 500 mg by mouth daily with breakfast.     . naproxen sodium (ANAPROX) 220 MG tablet Take 220 mg by mouth 2 (two) times daily as needed (for pain).    Marland Kitchen. OVER THE COUNTER MEDICATION Take 1-2 tablets by mouth at bedtime as needed (for sleep). For sleep. Equate brand Nighttime Sleep Aid    . rosuvastatin (CRESTOR) 20 MG tablet Take 20 mg by mouth daily.     No current facility-administered medications for this visit.     REVIEW OF SYSTEMS:   [X]  denotes positive finding, [ ]  denotes negative finding Cardiac  Comments:  Chest pain or chest pressure:    Shortness of breath upon exertion:    Short of breath when  lying flat:    Irregular heart rhythm:        Vascular    Pain in calf, thigh, or hip brought on by ambulation:    Pain in feet at night that wakes you up from your sleep:     Blood clot in your veins:    Leg swelling:         Pulmonary    Oxygen at home:    Productive cough:     Wheezing:         Neurologic    Sudden weakness in arms or legs:     Sudden numbness in arms or legs:     Sudden onset of difficulty speaking or slurred speech:    Temporary loss of vision in one eye:     Problems with dizziness:         Gastrointestinal    Blood in stool:     Vomited blood:         Genitourinary    Burning when urinating:     Blood in urine:        Psychiatric    Major depression:         Hematologic    Bleeding problems:    Problems with blood clotting too easily:        Skin     Rashes or ulcers:        Constitutional    Fever or chills:      PHYSICAL EXAM:   Vitals:   03/29/16 1423  BP: 122/66  Pulse: 100  Resp: (!) 22  Temp: 97.3 F (36.3 C)  TempSrc: Oral  SpO2: 96%  Weight: 193 lb (87.5 kg)  Height: 5\' 7"  (1.702 m)    GENERAL: The patient is a well-nourished male, in no acute distress. The vital signs are documented above. CARDIAC: There is a regular rate and rhythm.  VASCULAR: Both groin cannulation sites have completely healed PULMONARY: Non-labored respirations ABDOMEN: Soft and non-tender with normal pitched bowel sounds.  MUSCULOSKELETAL: There are no major deformities or cyanosis. NEUROLOGIC: No focal weakness or paresthesias are detected. SKIN: There are no ulcers or rashes noted. PSYCHIATRIC: The patient has a normal affect.  STUDIES:   I have reviewed his CT and Cheree Ditto with the following findings: 1. Decreasing size (4.9 cm today compared to 5.3 cm previously) of the infrarenal fusiform abdominal aortic aneurysm status post endovascular repair. Of note, a type 2 endoleak is visible on the delayed phase images on today's study. This is likely clinically insignificant given the decreasing size of the excluded aneurysm sac.  MEDICAL ISSUES:   Status post endovascular aneurysm repair: I diagrammed out what a endoleak was and explained the significance.  The patient's aneurysm continues to decrease in size.  It now measures 4.9 cm at maximum diameter.  We will continue with surveillance.  I will see him back in 1 year with a ultrasound.    Durene Cal, MD [facility] [phone] Pager [phone]

## 2016-04-05 NOTE — Addendum Note (Signed)
Addended by: Burton ApleyPETTY, Taheem Fricke A on: 04/05/2016 09:35 AM   Modules accepted: Orders

## 2016-04-28 ENCOUNTER — Telehealth: Payer: Self-pay

## 2016-04-28 NOTE — Telephone Encounter (Addendum)
Phone call from pt. with c/o persistent back pain, since his surgery for AAA.  Stated he fell from a ladder about 3 days after his procedure in 07/2014, and has had back pain, since that time.  Denied any abdominal pain.  Stated his pain is "all across the low back, and worsens after about 15 min. of standing and  working."  Stated he also gets very fatigued.  Reported his symptoms to his Cardiologist, and was advised to contact Dr. Myra Gianotti.  Questioned if he reported his ongoing back pain to his PCP, or to Dr. Myra Gianotti at his f/u on 03/29/16?  Denied discussing with Dr. Myra Gianotti, and also said he was afraid to tell his PCP, because he would be upset with him, for not reporting the fall.  Advised will discuss with Dr. Myra Gianotti, and also strongly encouraged pt. to report the ongoing back pain to his PCP.  Pt. Verb. Understanding.  Agreed.

## 2016-05-04 NOTE — Telephone Encounter (Signed)
Discussed pt's report of fall in July 2016, 3 days after his Endovascular Repair of AAA, and ongoing back pain, since that fall.  Per Dr. Myra Gianotti, this needs to be reported to his PCP.    Attempted to contact pt. With Dr. Estanislado Spire recommendation.  Left voice message re: recommendation to notify his PCP of the back pain, since his fall in 2016.

## 2016-07-07 ENCOUNTER — Encounter (HOSPITAL_COMMUNITY): Payer: Self-pay | Admitting: Emergency Medicine

## 2016-07-07 ENCOUNTER — Emergency Department (HOSPITAL_COMMUNITY)
Admission: EM | Admit: 2016-07-07 | Discharge: 2016-07-08 | Disposition: A | Discharge status: Home / Self Care | Payer: Medicare Other | Attending: Emergency Medicine | Admitting: Emergency Medicine

## 2016-07-07 DIAGNOSIS — R1032 Left lower quadrant pain: Secondary | ICD-10-CM | POA: Insufficient documentation

## 2016-07-07 DIAGNOSIS — Z7902 Long term (current) use of antithrombotics/antiplatelets: Secondary | ICD-10-CM | POA: Diagnosis not present

## 2016-07-07 DIAGNOSIS — E119 Type 2 diabetes mellitus without complications: Secondary | ICD-10-CM | POA: Insufficient documentation

## 2016-07-07 DIAGNOSIS — I2510 Atherosclerotic heart disease of native coronary artery without angina pectoris: Secondary | ICD-10-CM | POA: Insufficient documentation

## 2016-07-07 DIAGNOSIS — I10 Essential (primary) hypertension: Secondary | ICD-10-CM | POA: Insufficient documentation

## 2016-07-07 DIAGNOSIS — Z955 Presence of coronary angioplasty implant and graft: Secondary | ICD-10-CM | POA: Diagnosis not present

## 2016-07-07 DIAGNOSIS — Z79899 Other long term (current) drug therapy: Secondary | ICD-10-CM | POA: Insufficient documentation

## 2016-07-07 NOTE — ED Triage Notes (Signed)
Pt presents with pain to L groin cath site; pt reports valve replacement surg 2 years ago and having pain at the site; no obvious swelling, bleeding or bruising noted; pt voices concern that something is wrong

## 2016-07-08 ENCOUNTER — Emergency Department (HOSPITAL_COMMUNITY): Payer: Medicare Other

## 2016-07-08 LAB — CBC WITH DIFFERENTIAL/PLATELET
BASOS ABS: 0.1 10*3/uL (ref 0.0–0.1)
Basophils Relative: 1 %
Eosinophils Absolute: 0.4 10*3/uL (ref 0.0–0.7)
Eosinophils Relative: 4 %
HCT: 43.5 % (ref 39.0–52.0)
Hemoglobin: 14.2 g/dL (ref 13.0–17.0)
LYMPHS ABS: 3.4 10*3/uL (ref 0.7–4.0)
LYMPHS PCT: 33 %
MCH: 30.5 pg (ref 26.0–34.0)
MCHC: 32.6 g/dL (ref 30.0–36.0)
MCV: 93.5 fL (ref 78.0–100.0)
Monocytes Absolute: 1 10*3/uL (ref 0.1–1.0)
Monocytes Relative: 9 %
Neutro Abs: 5.8 10*3/uL (ref 1.7–7.7)
Neutrophils Relative %: 53 %
Platelets: 174 10*3/uL (ref 150–400)
RBC: 4.65 MIL/uL (ref 4.22–5.81)
RDW: 13.6 % (ref 11.5–15.5)
WBC: 10.6 10*3/uL — ABNORMAL HIGH (ref 4.0–10.5)

## 2016-07-08 LAB — BASIC METABOLIC PANEL
Anion gap: 10 (ref 5–15)
BUN: 18 mg/dL (ref 6–20)
CO2: 25 mmol/L (ref 22–32)
Calcium: 9.1 mg/dL (ref 8.9–10.3)
Chloride: 104 mmol/L (ref 101–111)
Creatinine, Ser: 1.65 mg/dL — ABNORMAL HIGH (ref 0.61–1.24)
GFR calc Af Amer: 43 mL/min — ABNORMAL LOW (ref >60)
GFR calc non Af Amer: 37 mL/min — ABNORMAL LOW (ref >60)
GLUCOSE: 103 mg/dL — AB (ref 65–99)
POTASSIUM: 4.3 mmol/L (ref 3.5–5.1)
Sodium: 139 mmol/L (ref 135–145)

## 2016-07-08 MED ORDER — IOPAMIDOL (ISOVUE-370) INJECTION 76%
INTRAVENOUS | Status: AC
  Administered 2016-07-08: 100 mL
  Filled 2016-07-08: qty 100

## 2016-07-08 NOTE — ED Notes (Signed)
Pt ambulatory to BR with slow but steady gait. Wife at bedside, denies pain

## 2016-07-08 NOTE — ED Notes (Signed)
Pt to CT via stretcher

## 2016-07-08 NOTE — ED Provider Notes (Signed)
[facility] DEPT Provider Note   CSN: [id number] Arrival date & time: 07/07/16  1922     History   Chief Complaint Chief Complaint  Patient presents with  . Wound Check    HPI Michael Wolfe is a 83 y.o. male.  The history is provided by the patient and medical records. No language interpreter was used.  Wound Check  Pertinent negatives include no chest pain, no abdominal pain and no shortness of breath.   Michael Wolfe is a 83 y.o. male  with a PMH of AAA s/p endovascular repair on 08/22/2014 who presents to the Emergency Department complaining of pain to the left groin which began today. Patient states this is the area where he had endovascular repair and concerned something may be wrong. He noticed a small amount of bruising to the area, but does not believe he hit it or had injury/trauma. No back pain, abdominal pain, scrotal/testicular pain. No erythema or warmth. No fevers. No medications or treatments prior to arrival for symptoms. No alleviating or aggravating factors noted.   Past Medical History:  Diagnosis Date  . AAA (abdominal aortic aneurysm) (HCC)   . Anginal pain (HCC)   . Arthritis    "left shoulder" (07/04/2014)  . Constipation   . Coronary artery disease   . Depression   . GERD (gastroesophageal reflux disease)   . Headache   . History of hiatal hernia   . Hyperlipidemia   . Hypertension   . Inferior myocardial infarction [facility]) 2003   Hattie Perch 06/25/2014  . Kidney stones X 1  . Osteoarthritis   . Pneumonia 1935; 1990's X 1  . Severe aortic stenosis 05/06/2014  . Shortness of breath dyspnea   . Type II diabetes mellitus [facility])     Patient Active Problem List   Diagnosis Date Noted  . AAA (abdominal aortic aneurysm) (HCC) 08/22/2014  . CAD (coronary artery disease) 08/12/2014  . Abdominal aortic aneurysm (HCC) 08/12/2014  . Sinus bradycardia 08/12/2014  . Aortic stenosis, severe 06/25/2014  . Severe aortic stenosis 05/06/2014    Past Surgical  History:  Procedure Laterality Date  . ABDOMINAL AORTIC ENDOVASCULAR STENT GRAFT N/A 08/22/2014   Procedure: ABDOMINAL AORTIC ENDOVASCULAR STENT GRAFT- GORE ;  Surgeon: Nada Libman, MD;  Location: [facility] OR;  Service: Vascular;  Laterality: N/A;  . CARDIAC CATHETERIZATION  2003; 2016   Hattie Perch 06/25/2014  . CARDIAC VALVE REPLACEMENT    . COLONOSCOPY    . CORONARY ANGIOGRAM  09/12/2012   Procedure: CORONARY ANGIOGRAM;  Surgeon: Robynn Pane, MD;  Location[facility] LAB;  Service: Cardiovascular;;  . CORONARY ANGIOPLASTY WITH STENT PLACEMENT  2009   Hattie Perch 06/25/2014  . LEFT AND RIGHT HEART CATHETERIZATION WITH CORONARY ANGIOGRAM N/A 05/21/2014   Procedure: LEFT AND RIGHT HEART CATHETERIZATION WITH CORONARY ANGIOGRAM;  Surgeon: Rinaldo Cloud, MD;  Location: [facility] C[facility] LAB;  Service: Cardiovascular;  Laterality: N/A;  . LITHOTRIPSY    . TEE WITHOUT CARDIOVERSION N/A 07/02/2014   Procedure: TRANSESOPHAGEAL ECHOCARDIOGRAM (TEE);  Surgeon: Tonny Bollman, MD;  Location: [facility] OR;  Service: Open Heart Surgery;  Laterality: N/A;  . TRANSCATHETER AORTIC VALVE REPLACEMENT, TRANSFEMORAL N/A 07/02/2014   Procedure: TRANSCATHETER AORTIC VALVE REPLACEMENT, TRANSFEMORAL;  Surgeon: Tonny Bollman, MD;  [facility];  Service: Open Heart Surgery;  Laterality: N/A;  TAVR-TF APPROACH ( VALVE)       Home Medications    Prior to Admission medications   Medication Sig Start Date End Date Taking? Authorizing Provider  acetaminophen (TYLENOL) 500 MG tablet     [provider]  amLODipine (NORVASC) 5 MG tablet Take 5 mg by mouth daily.     [provider]  clopidogrel (PLAVIX) 75 MG tablet Take 75 mg by mouth daily.    [provider]  glimepiride (AMARYL) 2 MG tablet Take 2 mg by mouth daily.     [provider]  lisinopril (PRINIVIL,ZESTRIL) 40 MG tablet Take 40 mg by mouth daily.    [provider]  metFORMIN (GLUCOPHAGE) 500 MG tablet Take 500 mg by mouth daily with  breakfast.     [provider]  naproxen sodium (ANAPROX) 220 MG tablet Take 220 mg by mouth 2 (two) times daily as needed (for pain).    [provider]  OVER THE COUNTER MEDICATION Take 1-2 tablets by mouth at bedtime as needed (for sleep). For sleep. Equate brand Nighttime Sleep Aid    [provider]  rosuvastatin (CRESTOR) 20 MG tablet Take 20 mg by mouth daily.    [provider]    Family History Family History  Problem Relation Age of Onset  . Hypertension Father   . AAA (abdominal aortic aneurysm) Father   . Cancer Mother     Social History Social History  Substance Use Topics  . Smoking status: Never Smoker  . Smokeless tobacco: Never Used  . Alcohol use No     Allergies   Patient has no known allergies.   Review of Systems Review of Systems  Constitutional: Negative for chills and fever.  Respiratory: Negative for shortness of breath.   Cardiovascular: Negative for chest pain.  Gastrointestinal: Negative for abdominal pain.  Genitourinary: Negative for dysuria, penile pain, penile swelling, scrotal swelling and testicular pain.  Musculoskeletal: Positive for myalgias.  Skin: Positive for color change.     Physical Exam Updated Vital Signs BP (!) 141/59 (BP Location: Right Arm)   Pulse 64   Temp 97.5 F (36.4 C) (Oral)   Resp 18   Ht 5\' 7"  (1.702 m)   Wt 88 kg (194 lb)   SpO2 100%   BMI 30.38 kg/m   Physical Exam  Constitutional: He is oriented to person, place, and time. He appears well-developed and well-nourished. No distress.  HENT:  Head: Normocephalic and atraumatic.  Cardiovascular: Normal rate, regular rhythm and normal heart sounds.   No murmur heard. Pulmonary/Chest: Effort normal and breath sounds normal. No respiratory distress.  Abdominal: Soft. He exhibits no distension. There is no tenderness.  Genitourinary:  Genitourinary Comments: Tenderness to palpation of left groin. No hernia appreciated.  No open wounds. No erythema or warmth.  Neurological: He is alert and oriented to person, place, and time.  Skin: Skin is warm and dry.  Nursing note and vitals reviewed.    ED Treatments / Results  Labs (all labs ordered are listed, but only abnormal results are displayed) Labs Reviewed  I-STAT CHEM 8, ED    EKG  EKG Interpretation None       Radiology No results found.  Procedures Procedures (including critical care time)  Medications Ordered in ED Medications - No data to display   Initial Impression / Assessment and Plan / ED Course  I have reviewed the triage vital signs and the nursing notes.  Pertinent labs & imaging results that were available during my care of the patient were reviewed by me and considered in my medical decision making (see chart for details).    Nadara Mode  is a 83 y.o. male who presents to ED for left groin pain. He has hx of endovascular repair and concerned there may be a problem with stent. Ct angio abd/pelvis ordered for further assessment which is pending at shift change. Seen by and discussed with Dr. Dalene SeltzerSchlossman who will follow up on imaging and dispo appropriately.   Patient seen by and discussed with Dr. Dalene SeltzerSchlossman who agrees with treatment plan.   Final Clinical Impressions(s) / ED Diagnoses   Final diagnoses:  None    New Prescriptions New Prescriptions   No medications on file     Jaicey Sweaney, Chase PicketJaime Pilcher, PA-C 07/08/16 [id number]    Alvira MondaySchlossman, Erin, MD 07/14/16 1328

## 2016-07-08 NOTE — ED Notes (Signed)
Assumed care for patient, pt changed into gown, VS updated.  Labs drawn

## 2016-07-08 NOTE — ED Notes (Signed)
Dr Dalene SeltzerSchlossman given a copy of chem 8 results

## 2016-07-12 LAB — I-STAT CHEM 8, ED
BUN: 29 mg/dL — ABNORMAL HIGH (ref 6–20)
CHLORIDE: 106 mmol/L (ref 101–111)
Calcium, Ion: 0.95 mmol/L — ABNORMAL LOW (ref 1.15–1.40)
Creatinine, Ser: 1.6 mg/dL — ABNORMAL HIGH (ref 0.61–1.24)
Glucose, Bld: 102 mg/dL — ABNORMAL HIGH (ref 65–99)
HCT: 42 % (ref 39.0–52.0)
HEMOGLOBIN: 14.3 g/dL (ref 13.0–17.0)
Potassium: 8 mmol/L (ref 3.5–5.1)
SODIUM: 135 mmol/L (ref 135–145)
TCO2: 26 mmol/L (ref 0–100)

## 2016-07-15 IMAGING — CT CT CTA ABD/PEL W/CM AND/OR W/O CM
2 of 9 series · 4 of 46 positions shown, 7 images · IV contrast (Iodine)
Comparison: CT of the abdomen and pelvis 12/22/2009.

CLINICAL DATA: 82-year-old male with severe aortic stenosis.
Preprocedural study prior to potential transcatheter aortic valve
replacement (TAVR).

EXAM:
CT ANGIOGRAPHY CHEST, ABDOMEN AND PELVIS
TECHNIQUE: Multidetector CT imaging through the chest, abdomen and pelvis was
performed using the standard protocol during bolus administration of
intravenous contrast. Multiplanar reconstructed images and MIPs were
obtained and reviewed to evaluate the vascular anatomy.
CONTRAST:  70mL OMNIPAQUE IOHEXOL 350 MG/ML SOLN

[Series 200: locator · axial · 0.59mm/px · z∈[-225,-225]mm · 1 of 1 slices shown, 3 images]
[im 1/1  soft-tissue]
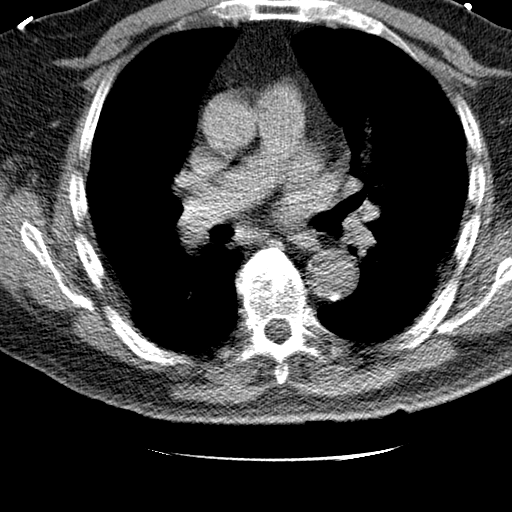
[im 1/1  lung]
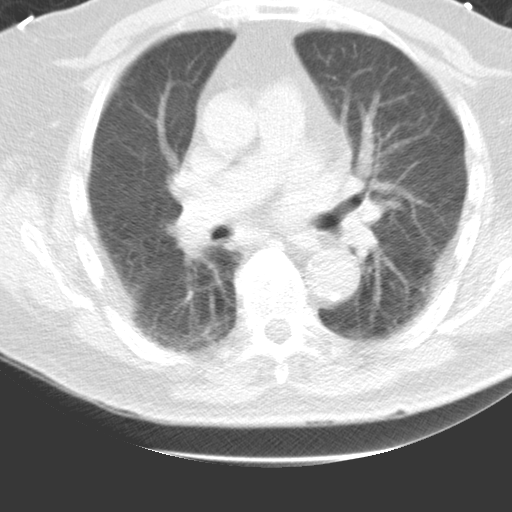
[im 1/1  bone]
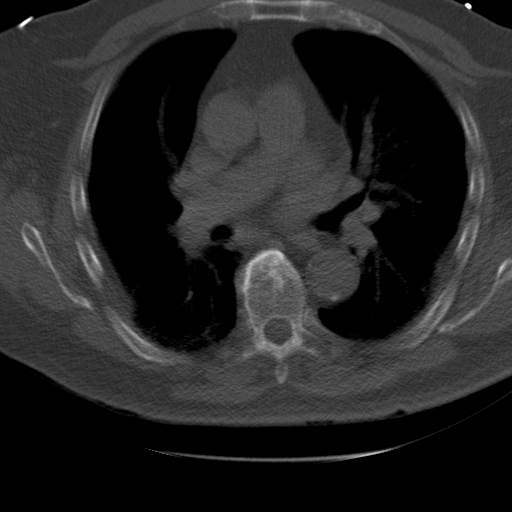

[Series 603: mpr coronal · coronal · 0.94mm/px · 3 of 122 slices shown, 4 images]
[im 31/122  soft-tissue]
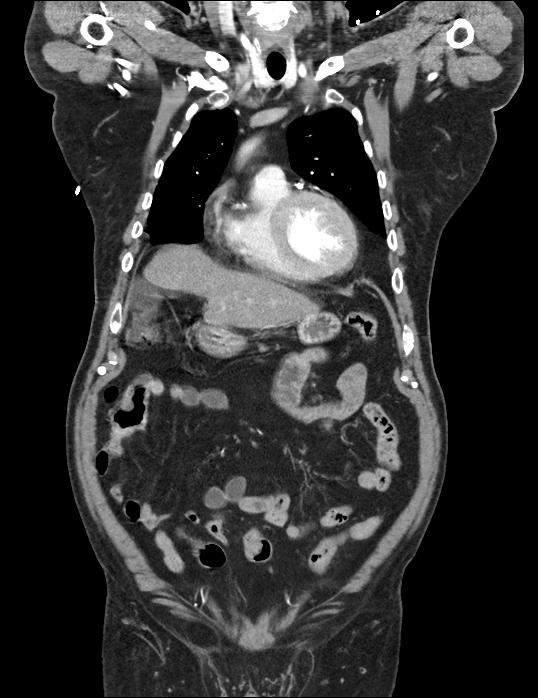
[im 61/122  soft-tissue]
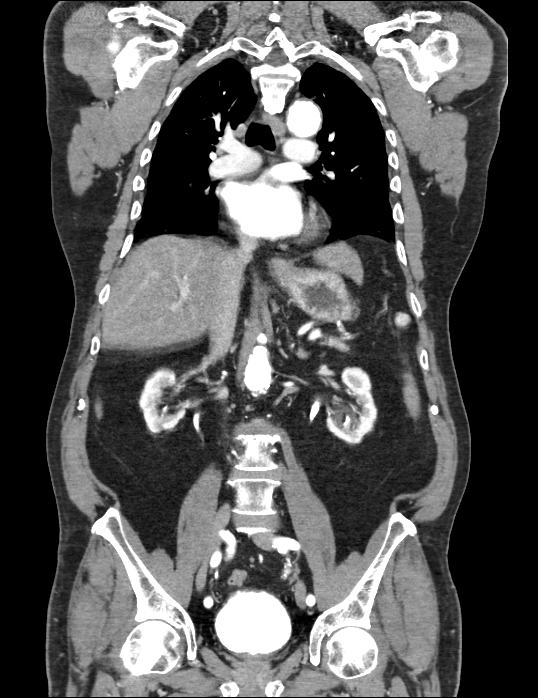
[im 61/122  bone]
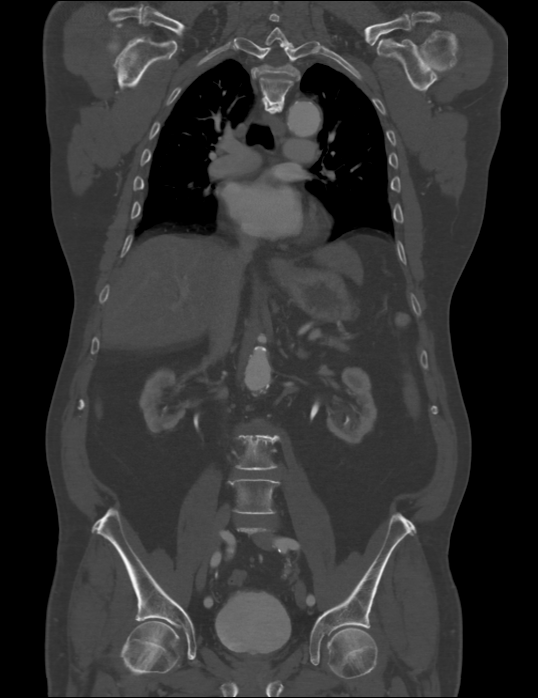
[im 91/122  soft-tissue]
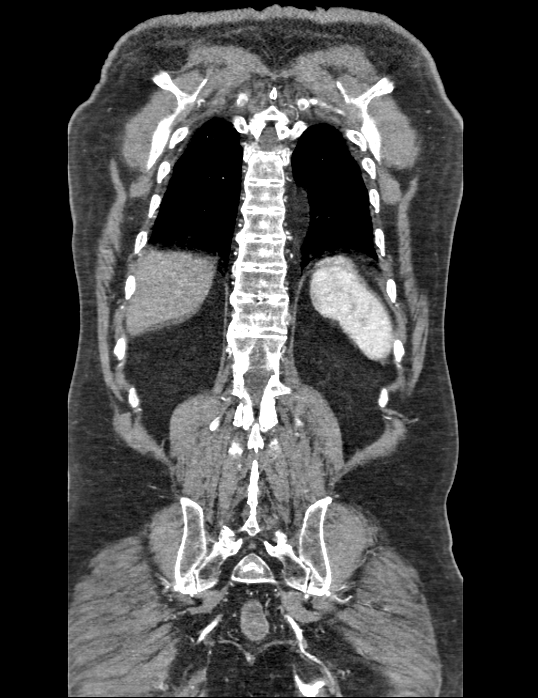

[4 of 46 positions shown; findings below may reference images not displayed]

FINDINGS: CTA CHEST FINDINGS

Mediastinum/Lymph Nodes: Heart size is borderline enlarged. There is
no significant pericardial fluid, thickening or pericardial
calcification. There is atherosclerosis of the thoracic aorta, the
great vessels of the mediastinum and the coronary arteries,
including calcified atherosclerotic plaque in the left main, left
anterior descending, left circumflex and right coronary arteries.
Severely calcified aortic valve. Mild calcifications of the mitral
valve and annulus. No pathologically enlarged mediastinal or hilar
lymph nodes. Esophagus is unremarkable in appearance. No axillary
lymphadenopathy.

Lungs/Pleura: 3 mm subpleural nodule in the periphery of the right
upper lobe (image 33 of series 403). No acute consolidative airspace
disease. No pleural effusions. Mild scarring in the lung bases
bilaterally.

Musculoskeletal/Soft Tissues: There are no aggressive appearing
lytic or blastic lesions noted in the visualized portions of the
skeleton.

CTA ABDOMEN AND PELVIS FINDINGS

Hepatobiliary: No cystic or solid hepatic lesions. No intra or
extrahepatic biliary ductal dilatation. High attenuation material
lying dependently in the gallbladder may reflect tiny gallstones
and/or biliary sludge. No current findings to suggest an acute
cholecystitis at this time.

Pancreas: No pancreatic mass. No pancreatic ductal dilatation. No
pancreatic or peripancreatic fluid or inflammatory changes.

Spleen: Unremarkable.

Adrenals/Urinary Tract: Bilateral adrenal glands are normal in
appearance. Mild multifocal cortical thinning in the kidneys
bilaterally. Several sub cm low-attenuation lesions are noted in the
kidneys bilaterally, too small to definitively characterize, but
statistically favored to represent tiny cysts.

Stomach/Bowel: The appearance of the stomach is normal. No
pathologic dilatation of small bowel or colon. A few scattered
colonic diverticulae are noted, particularly in the sigmoid colon,
without surrounding inflammatory changes to suggest an acute
diverticulitis at this time.

Vascular/Lymphatic: Vascular findings and measurements pertinent to
potential TAVR procedure, as detailed below. In addition, there is
fusiform aneurysmal dilatation of the infrarenal abdominal aorta,
which measures up to 5.1 x 4.6 cm. Two right renal arteries
bilaterally.

Reproductive: Prostate gland and seminal vesicles are unremarkable
in appearance.

Other: No significant volume of ascites.  No pneumoperitoneum.

Musculoskeletal: There are no aggressive appearing lytic or blastic
lesions noted in the visualized portions of the skeleton.

VASCULAR MEASUREMENTS PERTINENT TO TAVR:

AORTA:

Minimal Aortic Diameter -  18 x 18 mm

Severity of Aortic Calcification -  moderate

RIGHT PELVIS:

Right Common Iliac Artery -

Minimal Diameter - 10.9 x 10.9 mm

Tortuosity - moderate

Calcification - mild

Right External Iliac Artery -

Minimal Diameter - 7.5 x 8.3 mm

Tortuosity - moderate

Calcification - mild

Other - There appears to be a short segment likely chronic
dissection of the distal right external iliac artery.

Right Common Femoral Artery -

Minimal Diameter - 9.4 x 9.2 mm

Tortuosity - mild

Calcification - mild

LEFT PELVIS:

Left Common Iliac Artery -

Minimal Diameter - 12.1 x 12.7 mm

Tortuosity - mild

Calcification - mild

Left External Iliac Artery -

Minimal Diameter - 8.5 x 9.0 mm

Tortuosity - moderate

Calcification - mild

Left Common Femoral Artery -

Minimal Diameter - 8.0 x 9.2 mm

Tortuosity - mild

Calcification - mild

Review of the MIP images confirms the above findings.
IMPRESSION: 1. Vascular findings and measurements pertinent to potential TAVR
procedure, as above. This patient does have suitable pelvic arterial
access, on the left side. On the right side, there appears to be a
short segment dissection in the right external iliac artery which is
likely chronic.
2. In addition, there is fusiform aneurysmal dilatation of the
infrarenal abdominal aorta which measures up to 5.1 x 4.6 cm.
Recommend followup by abdomen and pelvis CTA in 3-6 months, and
vascular surgery referral/consultation if not already obtained. This
recommendation follows ACR consensus guidelines: White Paper of the
ACR Incidental Findings Committee II on Vascular Findings. [HOSPITAL] 0540; [DATE].
3. 3 mm subpleural nodule in the periphery of the right upper lobe
is statistically most likely to represent a subpleural lymph node.
If the patient is at high risk for bronchogenic carcinoma, follow-up
chest CT at 1 year is recommended. If the patient is at low risk, no
follow-up is needed. This recommendation follows the consensus
statement: Guidelines for Management of Small Pulmonary Nodules
Detected on CT Scans: A Statement from the [HOSPITAL] as
published in Radiology 5660; [DATE].
4. Mild colonic diverticulosis without findings to suggest acute
diverticulitis at this time.

## 2017-04-04 ENCOUNTER — Other Ambulatory Visit (HOSPITAL_COMMUNITY): Payer: Self-pay

## 2017-04-04 ENCOUNTER — Ambulatory Visit: Payer: Self-pay | Admitting: Surgery

## 2017-04-25 ENCOUNTER — Encounter: Payer: Self-pay | Admitting: Surgery

## 2017-04-25 ENCOUNTER — Other Ambulatory Visit: Payer: Self-pay

## 2017-04-25 ENCOUNTER — Ambulatory Visit (HOSPITAL_COMMUNITY)
Admission: RE | Admit: 2017-04-25 | Discharge: 2017-04-25 | Disposition: A | Discharge status: Home / Self Care | Payer: Medicare Other | Source: Ambulatory Visit | Attending: Surgery | Admitting: Surgery

## 2017-04-25 ENCOUNTER — Ambulatory Visit (INDEPENDENT_AMBULATORY_CARE_PROVIDER_SITE_OTHER): Payer: Medicare Other | Admitting: Surgery

## 2017-04-25 VITALS — BP 141/83 | HR 75 | Temp 97.0°F | Resp 16 | Ht 67.0 in | Wt 194.0 lb

## 2017-04-25 DIAGNOSIS — I714 Abdominal aortic aneurysm, without rupture, unspecified: Secondary | ICD-10-CM

## 2017-04-25 DIAGNOSIS — Z9889 Other specified postprocedural states: Secondary | ICD-10-CM | POA: Insufficient documentation

## 2017-04-25 DIAGNOSIS — Z09 Encounter for follow-up examination after completed treatment for conditions other than malignant neoplasm: Secondary | ICD-10-CM | POA: Diagnosis not present

## 2017-04-25 DIAGNOSIS — Z8679 Personal history of other diseases of the circulatory system: Secondary | ICD-10-CM | POA: Insufficient documentation

## 2017-04-25 NOTE — Progress Notes (Signed)
[facility] [place]  Patient name: Michael Wolfe MRN: [id number] DOB: [birth year] Sex: male   REASON FOR VISIT:    Follow up  HISOTRY OF PRESENT ILLNESS:    WYMON Wolfe is a 84 y.o. male who returns today for follow-up.  On 08/22/2014 he underwent endovascular repair of a infrarenal abdominal aortic aneurysm.  Aneurysm diameter at that time was 5.1 cm.  He is status post TAVR via a left femoral approach just prior to his aneurysm repair.  At his 1 month follow-up scan the aneurysm sac measures 4.9 cm.  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  At his follow-up in February 2017, the CT scan showed a maximum diameter of 5.3 cm.  No endoleak was visualized.   He has no complaints today  PAST MEDICAL HISTORY:   Past Medical History:  Diagnosis Date  . AAA (abdominal aortic aneurysm) (HCC)   . Anginal pain (HCC)   . Arthritis    "left shoulder" (07/04/2014)  . Constipation   . Coronary artery disease   . Depression   . GERD (gastroesophageal reflux disease)   . Headache   . History of hiatal hernia   . Hyperlipidemia   . Hypertension   . Inferior myocardial infarction [facility]) 2003   Hattie Perch 06/25/2014  . Kidney stones X 1  . Osteoarthritis   . Pneumonia 1935; 1990's X 1  . Severe aortic stenosis 05/06/2014  . Shortness of breath dyspnea   . Type II diabetes mellitus (HCC)      FAMILY HISTORY:   Family History  Problem Relation Age of Onset  . Hypertension Father   . AAA (abdominal aortic aneurysm) Father   . Cancer Mother     SOCIAL HISTORY:   Social History   Tobacco Use  . Smoking status: Never Smoker  . Smokeless tobacco: Never Used  Substance Use Topics  . Alcohol use: No    Alcohol/week: 0.0 oz     ALLERGIES:   No Known Allergies   CURRENT MEDICATIONS:   Current Outpatient Medications  Medication Sig Dispense Refill  .  acetaminophen (TYLENOL) 500 MG tablet     . amLODipine (NORVASC) 5 MG tablet Take 5 mg by mouth daily.     . clopidogrel (PLAVIX) 75 MG tablet Take 75 mg by mouth daily.    Marland Kitchen glimepiride (AMARYL) 2 MG tablet Take 2 mg by mouth daily.     Marland Kitchen lisinopril (PRINIVIL,ZESTRIL) 40 MG tablet Take 40 mg by mouth daily.    . metFORMIN (GLUCOPHAGE) 500 MG tablet Take 500 mg by mouth daily with breakfast.     . naproxen sodium (ANAPROX) 220 MG tablet Take 220 mg by mouth 2 (two) times daily as needed (for pain).    Marland Kitchen OVER THE COUNTER MEDICATION Take 1-2 tablets by mouth at bedtime as needed (for sleep). For sleep. Equate brand Nighttime Sleep Aid    . rosuvastatin (CRESTOR) 20 MG tablet Take 20 mg by mouth daily.     No current facility-administered medications for this visit.     REVIEW OF SYSTEMS:   [X]  denotes positive finding, [ ]  denotes negative finding Cardiac  Comments:  Chest pain or chest pressure:    Shortness of breath upon exertion:    Short of breath when lying flat:    Irregular heart rhythm:        Vascular    Pain in calf,  thigh, or hip brought on by ambulation:    Pain in feet at night that wakes you up from your sleep:     Blood clot in your veins:    Leg swelling:         Pulmonary    Oxygen at home:    Productive cough:     Wheezing:         Neurologic    Sudden weakness in arms or legs:     Sudden numbness in arms or legs:     Sudden onset of difficulty speaking or slurred speech:    Temporary loss of vision in one eye:     Problems with dizziness:         Gastrointestinal    Blood in stool:     Vomited blood:         Genitourinary    Burning when urinating:     Blood in urine:        Psychiatric    Major depression:         Hematologic    Bleeding problems:    Problems with blood clotting too easily:        Skin    Rashes or ulcers:        Constitutional    Fever or chills:      PHYSICAL EXAM:   Vitals:   04/25/17 0908  BP: (!) 141/83    Pulse: 75  Resp: 16  Temp: (!) 97 F (36.1 C)  TempSrc: Oral  SpO2: 97%  Weight: 194 lb (88 kg)  Height: 5\' 7"  (1.702 m)    GENERAL: The patient is a well-nourished male, in no acute distress. The vital signs are documented above. CARDIAC: There is a regular rate and rhythm.  VASCULAR: palpable femoral pulses PULMONARY: Non-labored respirations ABDOMEN: Soft and non-tender with normal pitched bowel sounds.  MUSCULOSKELETAL: There are no major deformities or cyanosis. NEUROLOGIC: No focal weakness or paresthesias are detected. SKIN: There are no ulcers or rashes noted. PSYCHIATRIC: The patient has a normal affect.  STUDIES:   I have ordered and reviewed his ultrasound.  Maximum aortic diameter is 4.5 cm.  No endoleak was visualized  MEDICAL ISSUES:   Status post endovascular repair of abdominal aortic aneurysm.  His aneurysm continues to get smaller.  I will see him back in 6 months for follow-up.    Durene CalWells Brabham, MD [facility] Tel [phone] Pager [phone]

## 2017-09-21 ENCOUNTER — Other Ambulatory Visit: Payer: Self-pay

## 2017-09-21 DIAGNOSIS — I714 Abdominal aortic aneurysm, without rupture, unspecified: Secondary | ICD-10-CM

## 2017-10-25 ENCOUNTER — Ambulatory Visit (HOSPITAL_COMMUNITY)
Admission: RE | Admit: 2017-10-25 | Discharge: 2017-10-25 | Disposition: A | Discharge status: Home / Self Care | Payer: Medicare Other | Source: Ambulatory Visit | Attending: Family | Admitting: Family

## 2017-10-25 ENCOUNTER — Other Ambulatory Visit: Payer: Self-pay

## 2017-10-25 ENCOUNTER — Ambulatory Visit (INDEPENDENT_AMBULATORY_CARE_PROVIDER_SITE_OTHER): Payer: Medicare Other | Admitting: Family

## 2017-10-25 ENCOUNTER — Encounter: Payer: Self-pay | Admitting: Family

## 2017-10-25 VITALS — BP 150/75 | HR 60 | Temp 97.0°F | Resp 18 | Ht 67.0 in | Wt 193.0 lb

## 2017-10-25 DIAGNOSIS — I714 Abdominal aortic aneurysm, without rupture, unspecified: Secondary | ICD-10-CM

## 2017-10-25 DIAGNOSIS — Z95828 Presence of other vascular implants and grafts: Secondary | ICD-10-CM | POA: Diagnosis not present

## 2017-10-25 NOTE — Progress Notes (Signed)
Vitals:   10/25/17 0836  BP: (!) 152/76  Pulse: 62  Resp: 18  Temp: (!) 97 F (36.1 C)  TempSrc: Oral  SpO2: 97%  Weight: 193 lb (87.5 kg)  Height: 5\' 7"  (1.702 m)

## 2017-10-25 NOTE — Progress Notes (Signed)
[facility] [place]  CC: Follow up s/p Endovascular Repair of Abdominal Aortic Aneurysm    History of Present Illness  Michael Wolfe is a 84 y.o. ([birth year]) male who is s/p endovascular repair of a infrarenal abdominal aortic aneurysm on 08/22/2014 by Dr. Myra Gianotti.  Aneurysm diameter at the time of repair was 5.1 cm.  He is status post TAVR via a left femoral approach just prior to his aneurysm repair. At his 1 month follow-up scan the aneurysm sac measured 4.9 cm. 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 At his follow-up in February 2017, the CT scan showed a maximum diameter of 5.3 cm. No endoleak was visualized.   Dr. Myra Gianotti last evaluated pt on 04-25-17. At that time his aneurysm continued to get smaller. Pt was to return in 6 months for follow-up.  He fell off a ladder in 2015, states his back has bothered him somewhat since then, no new back pain. He denies abdominal pain.   Pt states he had cardiac stents placed by Dr. Sharyn Lull.  He has no history of stroke or TIA.    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 AAA (abdominal aortic aneurysm) (HCC)   . Anginal pain (HCC)   . Arthritis    "left shoulder" (07/04/2014)  . Constipation   . Coronary artery disease   . Depression   . GERD (gastroesophageal reflux disease)   . Headache   . History of hiatal hernia   . Hyperlipidemia   . Hypertension   . Inferior myocardial infarction [facility]) 2003   Hattie Perch 06/25/2014  . Kidney stones X 1  . Osteoarthritis   . Pneumonia 1935; 1990's X 1  . Severe aortic stenosis 05/06/2014  . Shortness of breath dyspnea   . Type II diabetes mellitus (HCC)     Past Surgical History:  Procedure Laterality Date  . ABDOMINAL AORTIC ENDOVASCULAR STENT GRAFT N/A 08/22/2014   Procedure: ABDOMINAL AORTIC ENDOVASCULAR STENT GRAFT- GORE ;  Surgeon: Nada Libman, MD;  Location[facility] OR;  Service: Vascular;  Laterality: N/A;  . CARDIAC CATHETERIZATION  2003; 2016   Hattie Perch 06/25/2014  . CARDIAC VALVE REPLACEMENT    . COLONOSCOPY    . CORONARY ANGIOGRAM  09/12/2012   Procedure: CORONARY ANGIOGRAM;  Surgeon: Robynn Pane, MD;  Location: [facility] CATH LAB;  Service: Cardiovascular;;  . CORONARY ANGIOPLASTY WITH STENT PLACEMENT  2009   Hattie Perch 06/25/2014  . LEFT AND RIGHT HEART CATHETERIZATION WITH CORONARY ANGIOGRAM N/A 05/21/2014   Procedure: LEFT AND RIGHT HEART CATHETERIZATION WITH CORONARY ANGIOGRAM;  Surgeon: Rinaldo Cloud, MD;  Location: [facility] CATH LAB;  Service: Cardiovascular;  Laterality: N/A;  . LITHOTRIPSY    . TEE WITHOUT CARDIOVERSION N/A 07/02/2014   Procedure: TRANSESOPHAGEAL ECHOCARDIOGRAM (TEE);  Surgeon: Tonny Bollman, MD;  Location: [facility] OR;  Service: Open Heart Surgery;  Laterality: N/A;  . TRANSCATHETER AORTIC VALVE REPLACEMENT, TRANSFEMORAL N/A 07/02/2014   Procedure: TRANSCATHETER AORTIC VALVE REPLACEMENT, TRANSFEMORAL;  Surgeon: Tonny Bollman, MD;  Location: [facility] OR;  Service: Open Heart Surgery;  Laterality: N/A;  TAVR-TF APPROACH ( VALVE)   Social History Social History  Tobacco Use  . Smoking status: Never Smoker  . Smokeless tobacco: Never Used  Substance Use Topics  . Alcohol use: No    Alcohol/week: 0.0 standard drinks  . Drug use: No   Family History Family History  Problem Relation Age of Onset  . Hypertension Father   . AAA (abdominal aortic aneurysm) Father   . Cancer Mother    Current Outpatient Medications on File Prior to Visit  Medication Sig Dispense Refill  . acetaminophen (TYLENOL) 500 MG tablet     . amLODipine (NORVASC) 5 MG tablet Take 5 mg by mouth daily.     . clopidogrel (PLAVIX) 75 MG tablet Take 75 mg by  mouth daily.    Marland Kitchen glimepiride (AMARYL) 2 MG tablet Take 2 mg by mouth daily.     Marland Kitchen lisinopril (PRINIVIL,ZESTRIL) 40 MG tablet Take 40 mg by mouth daily.    . metFORMIN (GLUCOPHAGE) 500 MG tablet Take 500 mg by mouth daily with breakfast.     . naproxen sodium (ANAPROX) 220 MG tablet Take 220 mg by mouth 2 (two) times daily as needed (for pain).    Marland Kitchen OVER THE COUNTER MEDICATION Take 1-2 tablets by mouth at bedtime as needed (for sleep). For sleep. Equate brand Nighttime Sleep Aid    . rosuvastatin (CRESTOR) 20 MG tablet Take 20 mg by mouth daily.     No current facility-administered medications on file prior to visit.    No Known Allergies   ROS: See HPI for pertinent positives and negatives.  Physical Examination  Vitals:   10/25/17 0836 10/25/17 0837  BP: (!) 152/76 (!) 150/75  Pulse: 62 60  Resp: 18   Temp: (!) 97 F (36.1 C)   TempSrc: Oral   SpO2: 97%   Weight: 193 lb (87.5 kg)   Height: 5\' 7"  (1.702 m)    Body mass index is 30.23 kg/m.  General: A&O x 3, WD, obese male HEENT: No gross abnormalities  Pulmonary: Sym exp, respirations are non labored, good air movement in all fields CTAB, no rales, rhonchi, or wheezes  Cardiac: Regular rhythm and rate, no murmur appreciated  Vascular: Vessel Right Left  Radial 2+Palpable 2+Palpable  Carotid  with bruit  without bruit  Aorta Not palpable N/A  Popliteal Not palpable Not palpable  PT 2+Palpable 2+Palpable  DP 2+Palpable 2+Palpable   Gastrointestinal: soft, NTND, -G/R, - HSM, - palpable masses, - CVAT B. Musculoskeletal: M/S 5/5 throughout, extremities without ischemic changes. Skin: No rashes, no ulcers, no cellulitis.   Neurologic: Pain and light touch intact in extremities, Motor exam as listed above. CN 2-12 grossly intact except has significant hearing loss.  Psychiatric: Normal thought content, mood appropriate for clinical situation. Loquacious     DATA  EVAR Duplex   Previous (Date: 04-25-17) AAA sac  size: 4.4 cm  Current (Date: 10-25-17)  AAA sac size: 4.35 cm; Right CIA: 1.4 cm; Left CIA: 1.6 cm  No mention of endoleak detected   Carotid Duplex (08-09-14): <40% stenosis of bilateral ICA Significant stenosis of the right ECA    Medical Decision Making  KRISTY CATOE is a 84 y.o. male who presents s/p EVAR (Date: 08-22-14).  Pt is asymptomatic with stable sac size at 4.35 cm.  His blood pressure is somewhat in control. He has never used tobacco.  He has DM, last A1C result on file was 7.5 on 08-15-14, uncontrolled   - Right carotid bruit: likely from significant stenosis of right ECA as noted on  08-09-14 carotid duplex, he has no history of stroke or TIA.   - CKD stage 3b   I discussed with the patient the importance of surveillance of the endograft.  The next endograft duplex will be scheduled for 12 months.  The patient will follow up with [place] in 12 months with these studies.  I emphasized the importance of maximal medical management including strict control of blood pressure, blood glucose, and lipid levels, antiplatelet agents, obtaining regular exercise, and cessation of smoking.   Thank you for allowing [place] to participate in this patient's care.  Charisse March, RN, MSN, FNP-C [facility] [place] Office: [phone]  Clinic Physician: Bonnye Fava  10/25/2017, 8:46 AM

## 2017-10-25 NOTE — Patient Instructions (Signed)
Before your next abdominal ultrasound:  Avoid gas forming foods and beverages the day before the test.   Take two Extra-Strength Gas-X capsules at bedtime the night before the test. Take another two Extra-Strength Gas-X capsules in the middle of the night if you get up to the restroom, if not, first thing in the morning with water.  Do not chew gum.     

## 2018-02-11 ENCOUNTER — Encounter (HOSPITAL_COMMUNITY): Payer: Self-pay

## 2018-02-11 ENCOUNTER — Other Ambulatory Visit: Payer: Self-pay

## 2018-02-11 ENCOUNTER — Ambulatory Visit (HOSPITAL_COMMUNITY)
Admission: EM | Admit: 2018-02-11 | Discharge: 2018-02-11 | Disposition: A | Discharge status: Home / Self Care | Payer: Medicare Other | Attending: Family Medicine | Admitting: Family Medicine

## 2018-02-11 DIAGNOSIS — H6121 Impacted cerumen, right ear: Secondary | ICD-10-CM

## 2018-02-11 DIAGNOSIS — H9191 Unspecified hearing loss, right ear: Secondary | ICD-10-CM

## 2018-02-11 NOTE — ED Triage Notes (Signed)
Pt cc left ear has something in it, pt is not sure what's in him ear.

## 2018-02-13 NOTE — ED Provider Notes (Signed)
[facility]   [id number] 02/11/18 Arrival Time: 1348  ASSESSMENT & PLAN:  1. Impacted cerumen of right ear    Reports improvement of symptoms after flushing his ears. No foreign bodies or bugs found in his ear. Reassured.  May f/u with PCP or here as needed.  Reviewed expectations re: course of current medical issues. Questions answered. Outlined signs and symptoms indicating need for more acute intervention. Patient verbalized understanding. After Visit Summary given.   SUBJECTIVE: History from: patient.  Michael Wolfe is a 85 y.o. male who presents with complaint of "feeling like there's something in my right ear". No specific ear pain; without drainage; without bleeding. Onset gradual, over the past few days. Recent cold symptoms: none. Fever: no. Overall normal PO intake without n/v. Sick contacts: no. OTC treatment: none. Slightly muffled hearing from R ear.  Social History   Tobacco Use  Smoking Status Never Smoker  Smokeless Tobacco Never Used    ROS: As per HPI.   OBJECTIVE:  Vitals:   02/11/18 1526 02/11/18 1527  BP: 138/75   Pulse: 79   Resp: 18   Temp: 98.1 F (36.7 C)   TempSrc: Oral   SpO2: 100%   Weight:  88.9 kg    General appearance: alert; no distress Ear Canal: cerumen on the right; no signs of otitis externa; no FB TM: bilateral: normal after ear flushing Neck: supple without LAD Lungs: unlabored respirations, symmetrical air entry; cough: absent; no respiratory distress Skin: warm and dry Psychological: alert and cooperative; normal mood and affect  No Known Allergies  Past Medical History:  Diagnosis Date  . AAA (abdominal aortic aneurysm) (HCC)   . Anginal pain (HCC)   . Arthritis    "left shoulder" (07/04/2014)  . Constipation   . Coronary artery disease   . Depression   . GERD (gastroesophageal reflux disease)   . Headache   . History of hiatal hernia   . Hyperlipidemia   . Hypertension   . Inferior myocardial  infarction [facility]) 2003   Hattie Perch 06/25/2014  . Kidney stones X 1  . Osteoarthritis   . Pneumonia 1935; 1990's X 1  . Severe aortic stenosis 05/06/2014  . Shortness of breath dyspnea   . Type II diabetes mellitus (HCC)    Family History  Problem Relation Age of Onset  . Hypertension Father   . AAA (abdominal aortic aneurysm) Father   . Cancer Mother    Social History   Socioeconomic History  . Marital status: Married    Spouse name: Not on file  . Number of children: Not on file  . Years of education: Not on file  . Highest education level: Not on file  Occupational History  . Not on file  Social Needs  . Financial resource strain: Not on file  . Food insecurity:    Worry: Not on file    Inability: Not on file  . Transportation needs:    Medical: Not on file    Non-medical: Not on file  Tobacco Use  . Smoking status: Never Smoker  . Smokeless tobacco: Never Used  Substance and Sexual Activity  . Alcohol use: No    Alcohol/week: 0.0 standard drinks  . Drug use: No  . Sexual activity: Never  Lifestyle  . Physical activity:    Days per week: Not on file    Minutes per session: Not on file  . Stress: Not on file  Relationships  . Social connections:  Talks on phone: Not on file    Gets together: Not on file    Attends religious service: Not on file    Active member of club or organization: Not on file    Attends meetings of clubs or organizations: Not on file    Relationship status: Not on file  . Intimate partner violence:    Fear of current or ex partner: Not on file    Emotionally abused: Not on file    Physically abused: Not on file    Forced sexual activity: Not on file  Other Topics Concern  . Not on file  Social History Narrative  . Not on file            Mardella Layman, MD 02/13/18 0830

## 2019-03-16 ENCOUNTER — Other Ambulatory Visit: Payer: Self-pay

## 2019-03-16 ENCOUNTER — Encounter (HOSPITAL_COMMUNITY): Payer: Self-pay

## 2019-03-16 ENCOUNTER — Ambulatory Visit (HOSPITAL_COMMUNITY)
Admission: EM | Admit: 2019-03-16 | Discharge: 2019-03-16 | Disposition: A | Discharge status: Home / Self Care | Payer: Medicare Other | Attending: Family Medicine | Admitting: Family Medicine

## 2019-03-16 ENCOUNTER — Ambulatory Visit (INDEPENDENT_AMBULATORY_CARE_PROVIDER_SITE_OTHER): Payer: Medicare Other

## 2019-03-16 DIAGNOSIS — M545 Low back pain, unspecified: Secondary | ICD-10-CM

## 2019-03-16 DIAGNOSIS — W19XXXA Unspecified fall, initial encounter: Secondary | ICD-10-CM | POA: Diagnosis not present

## 2019-03-16 MED ORDER — CYCLOBENZAPRINE HCL 5 MG PO TABS: 5.0000 mg | ORAL_TABLET | Freq: Every day | ORAL | 0 refills | Status: DC

## 2019-03-16 NOTE — ED Provider Notes (Signed)
[facility]    CSN: [id number] Arrival date & time: 03/16/19  1455      History   Chief Complaint Chief Complaint  Patient presents with  . Fall  . Back Pain    HPI Michael Wolfe is a 86 y.o. male history of AAA, multiple aortic stents, CAD, hypertension, presenting today for evaluation of back pain.  Patient slipped and fell down 3-5 of his deck stairs earlier this morning.  Denies hitting head or loss of consciousness.  He has mainly had pain to his lower left back.  Denies any radiation into legs.  Denies numbness or tingling.  Denies issues with bowels or urination.  Has been applying a heating pad to the area.  Is on Plavix.  History of diabetes.  HPI  Past Medical History:  Diagnosis Date  . AAA (abdominal aortic aneurysm) (HCC)   . Anginal pain (HCC)   . Arthritis    "left shoulder" (07/04/2014)  . Constipation   . Coronary artery disease   . Depression   . GERD (gastroesophageal reflux disease)   . Headache   . History of hiatal hernia   . Hyperlipidemia   . Hypertension   . Inferior myocardial infarction [facility]) 2003   Hattie Perch 06/25/2014  . Kidney stones X 1  . Osteoarthritis   . Pneumonia 1935; 1990's X 1  . Severe aortic stenosis 05/06/2014  . Shortness of breath dyspnea   . Type II diabetes mellitus [facility])     Patient Active Problem List   Diagnosis Date Noted  . AAA (abdominal aortic aneurysm) (HCC) 08/22/2014  . CAD (coronary artery disease) 08/12/2014  . Abdominal aortic aneurysm (HCC) 08/12/2014  . Sinus bradycardia 08/12/2014  . Aortic stenosis, severe 06/25/2014  . Severe aortic stenosis 05/06/2014    Past Surgical History:  Procedure Laterality Date  . ABDOMINAL AORTIC ENDOVASCULAR STENT GRAFT N/A 08/22/2014   Procedure: ABDOMINAL AORTIC ENDOVASCULAR STENT GRAFT- GORE ;  Surgeon: Nada Libman, MD;  Location: [facility] OR;  Service: Vascular;  Laterality: N/A;  . CARDIAC CATHETERIZATION  2003; 2016   Hattie Perch 06/25/2014  . CARDIAC VALVE  REPLACEMENT    . COLONOSCOPY    . CORONARY ANGIOGRAM  09/12/2012   Procedure: CORONARY ANGIOGRAM;  Surgeon: Robynn Pane, MD;  Location: [facility] CATH LAB;  Service: Cardiovascular;;  . CORONARY ANGIOPLASTY WITH STENT PLACEMENT  2009   Hattie Perch 06/25/2014  . LEFT AND RIGHT HEART CATHETERIZATION WITH CORONARY ANGIOGRAM N/A 05/21/2014   Procedure: LEFT AND RIGHT HEART CATHETERIZATION WITH CORONARY ANGIOGRAM;  Surgeon: Rinaldo Cloud, MD;  Location: [facility] CATH LAB;  Service: Cardiovascular;  Laterality: N/A;  . LITHOTRIPSY    . TEE WITHOUT CARDIOVERSION N/A 07/02/2014   Procedure: TRANSESOPHAGEAL ECHOCARDIOGRAM (TEE);  Surgeon: Tonny Bollman, MD;  Location: [facility] OR;  Service: Open Heart Surgery;  Laterality: N/A;  . TRANSCATHETER AORTIC VALVE REPLACEMENT, TRANSFEMORAL N/A 07/02/2014   Procedure: TRANSCATHETER AORTIC VALVE REPLACEMENT, TRANSFEMORAL;  Surgeon: Tonny Bollman, MD;  Location: [facility] OR;  Service: Open Heart Surgery;  Laterality: N/A;  TAVR-TF APPROACH ( VALVE)       Home Medications    Prior to Admission medications   Medication Sig Start Date End Date Taking? Authorizing Provider  acetaminophen (TYLENOL) 500 MG tablet     [provider]  amLODipine (NORVASC) 5 MG tablet Take 5 mg by mouth daily.     [provider]  clopidogrel (PLAVIX) 75 MG tablet Take 75 mg by mouth daily.    [provider]  cyclobenzaprine (FLEXERIL) 5 MG tablet Take 1 tablet (5 mg total) by mouth at bedtime. 03/16/19   Kristy Schomburg C, PA-C  glimepiride (AMARYL) 2 MG tablet Take 2 mg by mouth daily.     [provider]  lisinopril (PRINIVIL,ZESTRIL) 40 MG tablet Take 40 mg by mouth daily.    [provider]  metFORMIN (GLUCOPHAGE) 500 MG tablet Take 500 mg by mouth daily with breakfast.     [provider]  naproxen sodium (ANAPROX) 220 MG tablet Take 220 mg by mouth 2 (two) times daily as needed (for pain).    [provider]  OVER THE COUNTER MEDICATION  Take 1-2 tablets by mouth at bedtime as needed (for sleep). For sleep. Equate brand Nighttime Sleep Aid    [provider]  rosuvastatin (CRESTOR) 20 MG tablet Take 20 mg by mouth daily.    [provider]    Family History Family History  Problem Relation Age of Onset  . Hypertension Father   . AAA (abdominal aortic aneurysm) Father   . Cancer Mother     Social History Social History   Tobacco Use  . Smoking status: Never Smoker  . Smokeless tobacco: Never Used  Substance Use Topics  . Alcohol use: No    Alcohol/week: 0.0 standard drinks  . Drug use: No     Allergies   Patient has no known allergies.   Review of Systems Review of Systems  Constitutional: Negative for fatigue and fever.  Eyes: Negative for redness, itching and visual disturbance.  Respiratory: Negative for shortness of breath.   Cardiovascular: Negative for chest pain and leg swelling.  Gastrointestinal: Negative for nausea and vomiting.  Genitourinary: Negative for decreased urine volume and difficulty urinating.  Musculoskeletal: Positive for back pain, gait problem and myalgias. Negative for arthralgias.  Skin: Positive for wound. Negative for color change and rash.  Neurological: Negative for dizziness, syncope, weakness, light-headedness and headaches.     Physical Exam Triage Vital Signs ED Triage Vitals [03/16/19 1518]  Enc Vitals Group     BP (!) 181/75     Pulse Rate 85     Resp 20     Temp 98.1 F (36.7 C)     Temp Source Oral     SpO2 98 %     Weight      Height      Head Circumference      Peak Flow      Pain Score 6     Pain Loc      Pain Edu?      Excl. in GC?    No data found.  Updated Vital Signs BP (!) 181/75 (BP Location: Left Arm)   Pulse 85   Temp 98.1 F (36.7 C) (Oral)   Resp 20   SpO2 98%   Visual Acuity Right Eye Distance:   Left Eye Distance:   Bilateral Distance:    Right Eye Near:   Left Eye Near:    Bilateral Near:      Physical Exam Vitals and nursing note reviewed.  Constitutional:      Appearance: He is well-developed.     Comments: No acute distress  HENT:     Head: Normocephalic and atraumatic.     Nose: Nose normal.  Eyes:     Conjunctiva/sclera: Conjunctivae normal.  Cardiovascular:     Rate and Rhythm: Normal rate.  Pulmonary:     Effort: Pulmonary effort is normal. No  respiratory distress.  Abdominal:     General: There is no distension.  Musculoskeletal:        General: Normal range of motion.     Cervical back: Neck supple.     Comments: Ambulates from chair to exam table slowly, but with minimal assistance  Nontender to palpation of cervical, thoracic spine midline, lower lumbar spine midline tenderness, no palpable deformity or step-off, increased tenderness throughout entire left lumbar musculature Strength 5/5 and equal bilaterally at hips and knees, patellar reflex 2+ bilaterally  Skin:    General: Skin is warm and dry.     Comments: Superficial linear abrasions noted to left aspect of lumbar spine  Neurological:     Mental Status: He is alert and oriented to person, place, and time.      UC Treatments / Results  Labs (all labs ordered are listed, but only abnormal results are displayed) Labs Reviewed - No data to display  EKG   Radiology DG Lumbar Spine Complete  Result Date: 03/16/2019 CLINICAL DATA:  [place] down stairs today. Low back pain and midline tenderness. EXAM: LUMBAR SPINE - COMPLETE 4+ VIEW COMPARISON:  Lumbar spine radiographs 09/06/2003. CTA abdomen and pelvis 07/08/2016. FINDINGS: There are 5 non rib-bearing lumbar type vertebrae. An L4 superior endplate compression fracture is new from 2005 but was present on the 2018 CTA. There is a chronic L2 superior endplate Schmorl's node, and there is a mild chronic L1 superior endplate compression fracture. No acute fracture is identified. Vertebral alignment is normal. There is chronic interbody ankylosis  throughout the lower thoracic spine. Moderate degenerative endplate spurring and up to mild disc space narrowing are noted from L1-2 to L3-4. An aortoiliac stent graft is noted. IMPRESSION: 1. Chronic compression fractures and mild-to-moderate spondylosis. 2. No acute osseous abnormality. Electronically Signed   By: Logan Bores M.D.   On: 03/16/2019 16:17    Procedures Procedures (including critical care time)  Medications Ordered in UC Medications - No data to display  Initial Impression / Assessment and Plan / UC Course  I have reviewed the triage vital signs and the nursing notes.  Pertinent labs & imaging results that were available during my care of the patient were reviewed by me and considered in my medical decision making (see chart for details).     X-ray showing persistent chronic compression fractures, no acute fractures noted.  Recommending patient to use Tylenol for pain along with continuing ice and heat topically.  May use Flexeril 5 mg at bedtime.  Discussed possible drowsiness associated with this medicine.  Advised only to use at nighttime.  Discussed strict return precautions. Patient verbalized understanding and is agreeable with plan.  Final Clinical Impressions(s) / UC Diagnoses   Final diagnoses:  Acute midline low back pain without sciatica  Fall, initial encounter     Discharge Instructions     No new fractures, xray showing persistent chronic compression fractures Tylenol [phone] mg every 6 hours Flexeril/cyclobenzaprine (muscle relaxer) at bedtime  Follow up with primary care for persistent back pain    ED Prescriptions    Medication Sig Dispense Auth. Provider   cyclobenzaprine (FLEXERIL) 5 MG tablet Take 1 tablet (5 mg total) by mouth at bedtime. 15 tablet Manases Etchison, [place] C, PA-C     PDMP not reviewed this encounter.   Janith Lima, PA-C 03/16/19 1704

## 2019-03-16 NOTE — Discharge Instructions (Signed)
No new fractures, xray showing persistent chronic compression fractures Tylenol [phone] mg every 6 hours Flexeril/cyclobenzaprine (muscle relaxer) at bedtime  Follow up with primary care for persistent back pain

## 2019-03-16 NOTE — ED Triage Notes (Signed)
Patient presents to Urgent Care with complaints of slipping and falling down four stairs since earlier today. Patient reports he did not pass out, did not hit head, hit his lower back. Is ambulatory but stiff with pain.  Pt is hard of hearing so son is with him during care.

## 2019-07-09 ENCOUNTER — Encounter (HOSPITAL_COMMUNITY): Payer: Self-pay | Admitting: Emergency Medicine

## 2019-07-09 ENCOUNTER — Emergency Department (HOSPITAL_COMMUNITY)
Admission: EM | Admit: 2019-07-09 | Discharge: 2019-07-09 | Disposition: A | Discharge status: Home / Self Care | Payer: Medicare Other | Attending: Emergency Medicine | Admitting: Emergency Medicine

## 2019-07-09 ENCOUNTER — Other Ambulatory Visit: Payer: Self-pay

## 2019-07-09 ENCOUNTER — Emergency Department (HOSPITAL_COMMUNITY): Payer: Medicare Other

## 2019-07-09 DIAGNOSIS — Z79899 Other long term (current) drug therapy: Secondary | ICD-10-CM | POA: Diagnosis not present

## 2019-07-09 DIAGNOSIS — Z7901 Long term (current) use of anticoagulants: Secondary | ICD-10-CM | POA: Diagnosis not present

## 2019-07-09 DIAGNOSIS — K5792 Diverticulitis of intestine, part unspecified, without perforation or abscess without bleeding: Secondary | ICD-10-CM | POA: Insufficient documentation

## 2019-07-09 DIAGNOSIS — E119 Type 2 diabetes mellitus without complications: Secondary | ICD-10-CM | POA: Insufficient documentation

## 2019-07-09 DIAGNOSIS — I10 Essential (primary) hypertension: Secondary | ICD-10-CM | POA: Diagnosis not present

## 2019-07-09 DIAGNOSIS — I2510 Atherosclerotic heart disease of native coronary artery without angina pectoris: Secondary | ICD-10-CM | POA: Insufficient documentation

## 2019-07-09 DIAGNOSIS — Z7984 Long term (current) use of oral hypoglycemic drugs: Secondary | ICD-10-CM | POA: Diagnosis not present

## 2019-07-09 DIAGNOSIS — R1032 Left lower quadrant pain: Secondary | ICD-10-CM | POA: Diagnosis present

## 2019-07-09 DIAGNOSIS — R10815 Periumbilic abdominal tenderness: Secondary | ICD-10-CM | POA: Diagnosis not present

## 2019-07-09 DIAGNOSIS — R197 Diarrhea, unspecified: Secondary | ICD-10-CM | POA: Insufficient documentation

## 2019-07-09 DIAGNOSIS — R519 Headache, unspecified: Secondary | ICD-10-CM | POA: Diagnosis not present

## 2019-07-09 DIAGNOSIS — I252 Old myocardial infarction: Secondary | ICD-10-CM | POA: Diagnosis not present

## 2019-07-09 LAB — CBC
HCT: 40.2 % (ref 39.0–52.0)
Hemoglobin: 12.9 g/dL — ABNORMAL LOW (ref 13.0–17.0)
MCH: 31 pg (ref 26.0–34.0)
MCHC: 32.1 g/dL (ref 30.0–36.0)
MCV: 96.6 fL (ref 80.0–100.0)
Platelets: 155 10*3/uL (ref 150–400)
RBC: 4.16 MIL/uL — ABNORMAL LOW (ref 4.22–5.81)
RDW: 13.2 % (ref 11.5–15.5)
WBC: 11.7 10*3/uL — ABNORMAL HIGH (ref 4.0–10.5)
nRBC: 0 % (ref 0.0–0.2)

## 2019-07-09 LAB — URINALYSIS, ROUTINE W REFLEX MICROSCOPIC
Bilirubin Urine: NEGATIVE
Glucose, UA: NEGATIVE mg/dL
Hgb urine dipstick: NEGATIVE
Ketones, ur: NEGATIVE mg/dL
Leukocytes,Ua: NEGATIVE
Nitrite: NEGATIVE
Protein, ur: NEGATIVE mg/dL
Specific Gravity, Urine: 1.027 (ref 1.005–1.030)
pH: 5 (ref 5.0–8.0)

## 2019-07-09 LAB — COMPREHENSIVE METABOLIC PANEL
ALT: 19 U/L (ref 0–44)
AST: 21 U/L (ref 15–41)
Albumin: 3.7 g/dL (ref 3.5–5.0)
Alkaline Phosphatase: 79 U/L (ref 38–126)
Anion gap: 9 (ref 5–15)
BUN: 24 mg/dL — ABNORMAL HIGH (ref 8–23)
CO2: 22 mmol/L (ref 22–32)
Calcium: 8.7 mg/dL — ABNORMAL LOW (ref 8.9–10.3)
Chloride: 107 mmol/L (ref 98–111)
Creatinine, Ser: 1.84 mg/dL — ABNORMAL HIGH (ref 0.61–1.24)
GFR calc Af Amer: 38 mL/min — ABNORMAL LOW (ref >60)
GFR calc non Af Amer: 33 mL/min — ABNORMAL LOW (ref >60)
Glucose, Bld: 160 mg/dL — ABNORMAL HIGH (ref 70–99)
Potassium: 4.5 mmol/L (ref 3.5–5.1)
Sodium: 138 mmol/L (ref 135–145)
Total Bilirubin: 0.6 mg/dL (ref 0.3–1.2)
Total Protein: 6.2 g/dL — ABNORMAL LOW (ref 6.5–8.1)

## 2019-07-09 LAB — POC OCCULT BLOOD, ED: Fecal Occult Bld: NEGATIVE

## 2019-07-09 LAB — LIPASE, BLOOD: Lipase: 30 U/L (ref 11–51)

## 2019-07-09 MED ORDER — IOHEXOL 350 MG/ML SOLN
80.0000 mL | Freq: Once | INTRAVENOUS | Status: AC | PRN
  Administered 2019-07-09: 80 mL via INTRAVENOUS

## 2019-07-09 MED ORDER — AMOXICILLIN-POT CLAVULANATE 875-125 MG PO TABS
1.0000 | ORAL_TABLET | Freq: Once | ORAL | Status: AC
  Administered 2019-07-09: 1 via ORAL
  Filled 2019-07-09: qty 1

## 2019-07-09 MED ORDER — SODIUM CHLORIDE 0.9% FLUSH: 3.0000 mL | Freq: Once | INTRAVENOUS | Status: DC

## 2019-07-09 MED ORDER — AMOXICILLIN-POT CLAVULANATE 875-125 MG PO TABS: 1.0000 | ORAL_TABLET | Freq: Two times a day (BID) | ORAL | 0 refills | Status: DC

## 2019-07-09 NOTE — Discharge Instructions (Signed)
As we discussed, your CT scan did show evidence of diverticulitis.  We will plan to put you on antibiotics. Take antibiotics as directed. Please take all of your antibiotics until finished.  Additionally, your CTA showed your aneurysm has increased from 4.7 cm to 5 cm.  It does appear stable but you need to follow-up with vascular.  Call their office and arrange appointment in the next 1 to 2 weeks.  Return the emergency department for any worsening pain, fever, vomiting or any other worsening or concerning symptoms.

## 2019-07-09 NOTE — ED Provider Notes (Signed)
[facility] Provider Note   CSN: [id number] Arrival date & time: 07/09/19  [id number]     History Chief Complaint  Patient presents with  . Abdominal Pain    Nadara ModeJames G Sulser is a 86 y.o. male past medical story of AAA, past medical history of GERD, hypertension, hyperlipidemia, kidney stones who presents for evaluation of abdominal pain.  Patient is a very poor historian.  He states that he has had intermittent abdominal pain over the last 2 weeks but felt like it worsened significantly last night.  He states it is mostly in the left lower quadrant.  He states that the pain got more severe this morning and he felt like he could not even walk secondary to the pain.  His wife states that he is been having pain over the last 2 weeks that she has tried to get him to go to the hospital but he would not.  Wife also states he has had some diarrhea and at one point, he told her he had noticed some blood in the stool.  Patient had not been having any nausea/vomiting or fevers.  He states that he has a history of a AAA and he is worried that is what the pain is from.  He states he follows up with vascular but he has not seen them in over a year.  He also reports he has had some intermittent headache over the last few weeks.  He states that "he feels some pressure in his head." Patient states that he has not had any dysuria, hematuria, chest pain, difficulty breathing, numbness/weakness.   History limited by poor historian   The history is provided by the patient and the spouse.       Past Medical History:  Diagnosis Date  . AAA (abdominal aortic aneurysm) (HCC)   . Anginal pain (HCC)   . Arthritis    "left shoulder" (07/04/2014)  . Constipation   . Coronary artery disease   . Depression   . GERD (gastroesophageal reflux disease)   . Headache   . History of hiatal hernia   . Hyperlipidemia   . Hypertension   . Inferior myocardial infarction [facility](HCC) 2003    Hattie Perch/notes 06/25/2014  . Kidney stones X 1  . Osteoarthritis   . Pneumonia 1935; 1990's X 1  . Severe aortic stenosis 05/06/2014  . Shortness of breath dyspnea   . Type II diabetes mellitus [facility](HCC)     Patient Active Problem List   Diagnosis Date Noted  . AAA (abdominal aortic aneurysm) (HCC) 08/22/2014  . CAD (coronary artery disease) 08/12/2014  . Abdominal aortic aneurysm (HCC) 08/12/2014  . Sinus bradycardia 08/12/2014  . Aortic stenosis, severe 06/25/2014  . Severe aortic stenosis 05/06/2014    Past Surgical History:  Procedure Laterality Date  . ABDOMINAL AORTIC ENDOVASCULAR STENT GRAFT N/A 08/22/2014   Procedure: ABDOMINAL AORTIC ENDOVASCULAR STENT GRAFT- GORE ;  Surgeon: Nada LibmanVance W Brabham, MD;  [facility];  Service: Vascular;  Laterality: N/A;  . CARDIAC CATHETERIZATION  2003; 2016   Hattie Perch/notes 06/25/2014  . CARDIAC VALVE REPLACEMENT    . COLONOSCOPY    . CORONARY ANGIOGRAM  09/12/2012   Procedure: CORONARY ANGIOGRAM;  Surgeon: Robynn PaneMohan N Harwani, MD;  [facility];  Service: Cardiovascular;;  . CORONARY ANGIOPLASTY WITH STENT PLACEMENT  2009   Hattie Perch/notes 06/25/2014  . LEFT AND RIGHT HEART CATHETERIZATION WITH CORONARY ANGIOGRAM N/A 05/21/2014   Procedure: LEFT AND RIGHT HEART CATHETERIZATION WITH CORONARY  ANGIOGRAM;  Surgeon: Rinaldo Cloud, MD;  Location: [facility] CATH LAB;  Service: Cardiovascular;  Laterality: N/A;  . LITHOTRIPSY    . TEE WITHOUT CARDIOVERSION N/A 07/02/2014   Procedure: TRANSESOPHAGEAL ECHOCARDIOGRAM (TEE);  Surgeon: Tonny Bollman, MD;  Location: [facility] OR;  Service: Open Heart Surgery;  Laterality: N/A;  . TRANSCATHETER AORTIC VALVE REPLACEMENT, TRANSFEMORAL N/A 07/02/2014   Procedure: TRANSCATHETER AORTIC VALVE REPLACEMENT, TRANSFEMORAL;  Surgeon: Tonny Bollman, MD;  Location: [facility] OR;  Service: Open Heart Surgery;  Laterality: N/A;  TAVR-TF APPROACH ( VALVE)       Family History  Problem Relation Age of Onset  . Hypertension Father   . AAA (abdominal aortic  aneurysm) Father   . Cancer Mother     Social History   Tobacco Use  . Smoking status: Never Smoker  . Smokeless tobacco: Never Used  Vaping Use  . Vaping Use: Never used  Substance Use Topics  . Alcohol use: No    Alcohol/week: 0.0 standard drinks  . Drug use: No    Home Medications Prior to Admission medications   Medication Sig Start Date End Date Taking? Authorizing Provider  amLODipine (NORVASC) 5 MG tablet Take 5 mg by mouth in the morning and at bedtime.    Yes [provider]  clopidogrel (PLAVIX) 75 MG tablet Take 75 mg by mouth daily.   Yes [provider]  glimepiride (AMARYL) 2 MG tablet Take 4 mg by mouth daily.    Yes [provider]  ibuprofen (ADVIL) 200 MG tablet Take 200 mg by mouth every 6 (six) hours as needed for mild pain.   Yes [provider]  Ibuprofen-diphenhydrAMINE Cit (ADVIL PM) 200-38 MG TABS Take 1-2 tablets by mouth at bedtime as needed (pain, sleep).   Yes [provider]  lisinopril (PRINIVIL,ZESTRIL) 40 MG tablet Take 40 mg by mouth in the morning and at bedtime.    Yes [provider]  metFORMIN (GLUCOPHAGE) 500 MG tablet Take 500 mg by mouth in the morning, at noon, in the evening, and at bedtime.    Yes [provider]  rosuvastatin (CRESTOR) 20 MG tablet Take 20 mg by mouth daily.   Yes [provider]  amoxicillin-clavulanate (AUGMENTIN) 875-125 MG tablet Take 1 tablet by mouth every 12 (twelve) hours. 07/09/19   Maxwell Caul, PA-C    Allergies    Patient has no known allergies.  Review of Systems   Review of Systems  Constitutional: Negative for fever.  Respiratory: Negative for cough and shortness of breath.   Cardiovascular: Negative for chest pain.  Gastrointestinal: Positive for abdominal pain. Negative for nausea and vomiting.  Genitourinary: Negative for dysuria and hematuria.  Neurological: Positive for headaches.  All other systems reviewed and are  negative.   Physical Exam Updated Vital Signs BP 129/65   Pulse (!) 58   Temp 98.2 F (36.8 C) (Oral)   Resp 14   SpO2 97%   Physical Exam Vitals and nursing note reviewed. Exam conducted with a chaperone present.  Constitutional:      Appearance: Normal appearance. He is well-developed.  HENT:     Head: Normocephalic and atraumatic.  Eyes:     General: Lids are normal.     Conjunctiva/sclera: Conjunctivae normal.     Pupils: Pupils are equal, round, and reactive to light.     Comments: PERRL. EOMs intact. No nystagmus. No neglect.   Cardiovascular:     Rate and Rhythm: Normal rate and regular rhythm.  Pulses: Normal pulses.          Radial pulses are 2+ on the right side and 2+ on the left side.       Dorsalis pedis pulses are 2+ on the right side and 2+ on the left side.     Heart sounds: Normal heart sounds. No murmur heard.  No friction rub. No gallop.   Pulmonary:     Effort: Pulmonary effort is normal.     Breath sounds: Normal breath sounds.     Comments: Lungs clear to auscultation bilaterally.  Symmetric chest rise.  No wheezing, rales, rhonchi. Abdominal:     Palpations: Abdomen is soft. Abdomen is not rigid.     Tenderness: There is abdominal tenderness in the periumbilical area and left lower quadrant. There is no right CVA tenderness, left CVA tenderness or guarding.     Hernia: A hernia is present.     Comments: Abdomen soft, nondistended.  Tenderness palpation noted to the periumbilical and left lower quadrant region.  He has ventral hernia noted that is easily reducible and felt warm, erythematous or tender to palpation.  No CVA tenderness noted bilaterally.  Genitourinary:    Comments: The exam was performed with a chaperone present. Digital Rectal Exam reveals sphincter with good tone. No external hemorrhoids. No masses or fissures. Stool color is brown with no overt blood. Musculoskeletal:        General: Normal range of motion.     Cervical back: Full  passive range of motion without pain.  Skin:    General: Skin is warm and dry.     Capillary Refill: Capillary refill takes less than 2 seconds.  Neurological:     Mental Status: He is alert and oriented to person, place, and time.  Psychiatric:        Speech: Speech normal.     ED Results / Procedures / Treatments   Labs (all labs ordered are listed, but only abnormal results are displayed) Labs Reviewed  COMPREHENSIVE METABOLIC PANEL - Abnormal; Notable for the following components:      Result Value   Glucose, Bld 160 (*)    BUN 24 (*)    Creatinine, Ser 1.84 (*)    Calcium 8.7 (*)    Total Protein 6.2 (*)    GFR calc non Af Amer 33 (*)    GFR calc Af Amer 38 (*)    All other components within normal limits  CBC - Abnormal; Notable for the following components:   WBC 11.7 (*)    RBC 4.16 (*)    Hemoglobin 12.9 (*)    All other components within normal limits  URINALYSIS, ROUTINE W REFLEX MICROSCOPIC - Abnormal; Notable for the following components:   Color, Urine STRAW (*)    All other components within normal limits  LIPASE, BLOOD  POC OCCULT BLOOD, ED    EKG EKG Interpretation  Date/Time:  Monday July 09 2019 08:34:31 EDT Ventricular Rate:  69 PR Interval:    QRS Duration: 124 QT Interval:  398 QTC Calculation: 427 R Axis:   -51 Text Interpretation: Sinus or ectopic atrial rhythm Prolonged PR interval Left bundle branch block No significant change since last tracing Confirmed by Lacretia Leigh ([id number]) on 07/09/2019 9:45:22 AM   Radiology CT Head Wo Contrast  Result Date: 07/09/2019 CLINICAL DATA:  Headache. EXAM: CT HEAD WITHOUT CONTRAST TECHNIQUE: Contiguous axial images were obtained from the base of the skull through the vertex without intravenous contrast. COMPARISON:  CT head dated Jun 08, 2006. FINDINGS: Brain: No evidence of acute infarction, hemorrhage, hydrocephalus, extra-axial collection or mass lesion/mass effect. Progressed mild chronic  microvascular ischemic changes and mild to moderate generalized cerebral atrophy since 2008. Vascular: Atherosclerotic vascular calcification of the carotid siphons. No hyperdense vessel. Skull: Normal. Negative for fracture or focal lesion. Sinuses/Orbits: No acute finding. Other: None. IMPRESSION: 1. No acute intracranial abnormality. Electronically Signed   By: Obie Dredge M.D.   On: 07/09/2019 10:57   CT Angio Abd/Pel W and/or Wo Contrast  Result Date: 07/09/2019 CLINICAL DATA:  Abdominal pain, left lower.  Back pain, headache. EXAM: CTA ABDOMEN AND PELVIS WITH CONTRAST TECHNIQUE: Multidetector CT imaging of the abdomen and pelvis was performed using the standard protocol during bolus administration of intravenous contrast. Multiplanar reconstructed images and MIPs were obtained and reviewed to evaluate the vascular anatomy. CONTRAST:  80mL OMNIPAQUE IOHEXOL 350 MG/ML SOLN COMPARISON:  07/08/2016 FINDINGS: VASCULAR Aorta: Moderate calcified atheromatous plaque in the visualized distal descending thoracic and suprarenal segments. Patent bifurcated infrarenal stent graft. Persistent type 2 endoleak. Native sac diameter 5 x 4.8 cm (previously 4.7 x 4.5). No dissection or stenosis. Celiac: Patent without evidence of aneurysm, dissection, vasculitis or significant stenosis. SMA: Calcified ostial plaque without high-grade stenosis. Patent distally with classic distal branch anatomy. Renals: Duplicated left, inferior dominant, both patent. Duplicated right, superior dominant, both patent. IMA: Continued patency nearly to its origin suggesting possible retrograde flow contributing to endoleak. Inflow: Stent graft limbs extend to distal common iliac arteries, well apposed. Scattered calcified plaque in the native iliac arterial system without aneurysm or stenosis. Proximal Outflow: Bilateral common femoral and visualized portions of the superficial and profunda femoral arteries are patent without evidence of  aneurysm, dissection, vasculitis or significant stenosis. Veins: No obvious venous abnormality within the limitations of this arterial phase study. Review of the MIP images confirms the above findings. NON-VASCULAR Lower chest: Changes of previous AVR. Coronary calcifications. No pleural or pericardial effusion. Hepatobiliary: Multiple small partially calcified stones layer in the dependent aspect of the nondilated gallbladder. No focal liver lesion or biliary ductal dilatation. Pancreas: Unremarkable. No pancreatic ductal dilatation or surrounding inflammatory changes. Spleen: Normal in size without focal abnormality. Adrenals/Urinary Tract: Adrenal glands are unremarkable. Kidneys are normal, without renal calculi, focal lesion, or hydronephrosis. Bladder is unremarkable. Stomach/Bowel: Stomach and small bowel are nondistended. Normal appendix. The colon is nondilated. Multiple distal descending and sigmoid diverticula. Mild inflammatory change around the descending/sigmoid junction. No extraluminal gas or abscess. Lymphatic: No abdominal or pelvic adenopathy. Reproductive: Prostate is unremarkable. Other: No ascites.  Bilateral pelvic phleboliths.  No free air. Musculoskeletal: Chronic mild L2 and L4 superior endplate compression deformities. No fracture or worrisome bone lesion. IMPRESSION: 1. Descending and sigmoid diverticulosis with mild inflammatory change around the descending/sigmoid junction suggesting diverticulitis, without abscess. 2. Patent bifurcated infrarenal stent graft with persistent type 2 endoleak, interval increase in size of native sac diameter to 5 cm (from 4.7). 3. Coronary artery disease. The severity of coronary artery disease and any potential stenosis cannot be assessed on this non-gated CT examination. 4. Cholelithiasis. 5. Chronic L2 and L4 superior endplate compression deformities. Aortic Atherosclerosis (ICD10-I70.0). Electronically Signed   By: Corlis Leak M.D.   On: 07/09/2019  12:35    Procedures Procedures (including critical care time)  Medications Ordered in ED Medications  sodium chloride flush (NS) 0.9 % injection 3 mL (has no administration in time range)  amoxicillin-clavulanate (AUGMENTIN) 875-125 MG per tablet 1 tablet (has  no administration in time range)  iohexol (OMNIPAQUE) 350 MG/ML injection 80 mL (80 mLs Intravenous Contrast Given 07/09/19 1054)    ED Course  I have reviewed the triage vital signs and the nursing notes.  Pertinent labs & imaging results that were available during my care of the patient were reviewed by me and considered in my medical decision making (see chart for details).    MDM Rules/Calculators/A&P                          86 year old male past mostly of AAA, GERD, kidney stone who presents for evaluation of abdominal pain.  Patient is a very poor historian.  He states he has had some intermittent abdominal pain over the last 2 weeks in wife states that she has been trying to get him to go to the hospital.  He felt like the pain worsened significantly last night and into this morning.  No urinary complaints.  No chest pain, difficulty breathing.  Patient states he has a history of AAA and has not followed vascular in about a year.  No recent fevers, vomiting.  He has had some diarrhea and wife states that on one occasion, he told her that there was some blood in the stool.  On initially arrival, he is afebrile, nontoxic-appearing.  Vital signs are stable. He does have tenderness particularly in left lower quadrant periumbilical region.  Low suspicion for infectious etiology but is a consideration.  Also consider diverticulitis.  Also concern for possible AAA complication given history/physical exam.  We will plan for labs, imaging.  Additionally, no neuro deficits on exam.  Low suspicion for CVA.  We will plan for imaging.  Review of records show that he had a infrarenal abdominal aortic aneurysm repair on 08/14/2014 by Dr.  Overton Mam, (Vascular).  He last had it evaluated on 10/25/2017 at his last office visit.  At that time, the sac size appeared to be 4.35 cm with no endoleak detected.  CBC shows slight leukocytosis of 11.7. Hgb is 12.9. His last Hgb  Was 14.2 at 3 years ago. CMP shows BUN of 24 and Cr of 1.84. Lipase is normal. Fecal occult is negative.   CTA shows of descending and sigmoid diverticulosis with mild inflammatory changes suggesting diverticulitis.  No evidence of abscess.  He has a patent bifurcated infrarenal stent graft with persistent type II endoleak.  There is an interval increase in size of the sac diameter to 5 cm (up from 4.7.  No other acute abnormalities.  Discussed patient with Dr. Arbie Cookey (Vascular) who reviewed patient's imaging.  He feels that this is stable given prior and that patient can follow-up with outpatient.  I discussed results with patient and wife.  We will plan to treat diverticulitis with Augmentin.  Patient with no known drug allergies.  Instructed patient that he will need to follow-up with vascular.  Instructed them to arrange an outpatient appointment next few weeks. At this time, patient exhibits no emergent life-threatening condition that require further evaluation in ED or admission. Discussed patient with Dr. Freida Busman who is agreeable to plan.   Portions of this note were generated with Scientist, clinical (histocompatibility and immunogenetics). Dictation errors may occur despite best attempts at proofreading.   Final Clinical Impression(s) / ED Diagnoses Final diagnoses:  Diverticulitis    Rx / DC Orders ED Discharge Orders         Ordered    amoxicillin-clavulanate (AUGMENTIN) 875-125 MG tablet  Every  12 hours     Discontinue  Reprint     07/09/19 1312           Rosana Hoes 07/09/19 1322    Lorre Nick, MD 07/10/19 [phone]

## 2019-07-09 NOTE — ED Provider Notes (Signed)
Medical screening examination/treatment/procedure(s) were conducted as a shared visit with non-physician practitioner(s) and myself.  I personally evaluated the patient during the encounter.  EKG Interpretation  Date/Time:  Monday July 09 2019 08:34:31 EDT Ventricular Rate:  69 PR Interval:    QRS Duration: 124 QT Interval:  398 QTC Calculation: 427 R Axis:   -51 Text Interpretation: Sinus or ectopic atrial rhythm Prolonged PR interval Left bundle branch block No significant change since last tracing Confirmed by Lorre Nick ([id number]) on 07/09/2019 9:29:57 AM 86 year old male presents with abdominal discomfort in his left lower quadrant.  Abdominal exam is without peritoneal signs.  Abdominal CT pending   Lorre Nick, MD 07/09/19 1017

## 2019-07-09 NOTE — ED Triage Notes (Addendum)
Pt reports that he woke up this am with lower left abd pain, states that he could barely walk due to the pain and had to use his walker. He reports aneurysm surgery (AAA per hx in chart) in the same area as the pain approx 4 years ago. Denies radiation of pain.

## 2019-07-09 NOTE — ED Notes (Signed)
Pt transported to CT ?

## 2019-12-28 ENCOUNTER — Other Ambulatory Visit: Payer: Self-pay

## 2019-12-28 DIAGNOSIS — I714 Abdominal aortic aneurysm, without rupture, unspecified: Secondary | ICD-10-CM

## 2020-01-07 ENCOUNTER — Ambulatory Visit (HOSPITAL_COMMUNITY)
Admission: RE | Admit: 2020-01-07 | Discharge: 2020-01-07 | Disposition: A | Discharge status: Home / Self Care | Payer: Medicare Other | Source: Ambulatory Visit | Attending: Physician Assistant | Admitting: Physician Assistant

## 2020-01-07 ENCOUNTER — Ambulatory Visit (INDEPENDENT_AMBULATORY_CARE_PROVIDER_SITE_OTHER): Payer: Medicare Other | Admitting: Physician Assistant

## 2020-01-07 ENCOUNTER — Other Ambulatory Visit: Payer: Self-pay

## 2020-01-07 VITALS — BP 152/78 | HR 74 | Temp 98.4°F | Resp 20 | Ht 67.0 in | Wt 188.1 lb

## 2020-01-07 DIAGNOSIS — I714 Abdominal aortic aneurysm, without rupture, unspecified: Secondary | ICD-10-CM

## 2020-01-07 NOTE — Progress Notes (Signed)
Office Note     CC:  follow up Requesting Provider:  Rinaldo Cloud, MD  HPI: Michael Wolfe is a 86 y.o. (August 21, 1931) male who presents for routine surveillance follow up of EVAR of a infrarenal AAA on 08/22/14 by Dr. Myra Gianotti. There has been a Type II endoleak identified since his initial 1 month follow up CT scan. This has been being monitored. Follow up CT in 2017 showed no endoleak. He was last seen in October of 2019 at which time the Aneurysm sac size was 4.35 cm. He remained asymptomatic at this time.  Today he presents an is not complaining of any abdominal or back pain that is new. He has long history of back pain from a fall many years ago. Patient is not the greatest historian and hard of hearing. His wife is present and assists in providing history. He denies any weakness or numbness in his legs. He does  Not describe any rest pain or claudication. He says he does occasionally get cramps in his legs when they "get cold".  The pt is on a statin for cholesterol management.  The pt is not on a daily aspirin.   Other AC:  Plavix The pt is on CCB, ACE for hypertension.   The pt is diabetic.  Tobacco hx:  never  Past Medical History:  Diagnosis Date  . AAA (abdominal aortic aneurysm) (HCC)   . Anginal pain (HCC)   . Arthritis    "left shoulder" (07/04/2014)  . Constipation   . Coronary artery disease   . Depression   . GERD (gastroesophageal reflux disease)   . Headache   . History of hiatal hernia   . Hyperlipidemia   . Hypertension   . Inferior myocardial infarction [facility]) 2003   Hattie Perch 06/25/2014  . Kidney stones X 1  . Osteoarthritis   . Pneumonia 1935; 1990's X 1  . Severe aortic stenosis 05/06/2014  . Shortness of breath dyspnea   . Type II diabetes mellitus (HCC)     Past Surgical History:  Procedure Laterality Date  . ABDOMINAL AORTIC ENDOVASCULAR STENT GRAFT N/A 08/22/2014   Procedure: ABDOMINAL AORTIC ENDOVASCULAR STENT GRAFT- GORE ;  Surgeon: Nada Libman,  MD;  Location: [facility] OR;  Service: Vascular;  Laterality: N/A;  . CARDIAC CATHETERIZATION  2003; 2016   Hattie Perch 06/25/2014  . CARDIAC VALVE REPLACEMENT    . COLONOSCOPY    . CORONARY ANGIOGRAM  09/12/2012   Procedure: CORONARY ANGIOGRAM;  Surgeon: Robynn Pane, MD;  Location: [facility] CATH LAB;  Service: Cardiovascular;;  . CORONARY ANGIOPLASTY WITH STENT PLACEMENT  2009   Hattie Perch 06/25/2014  . LEFT AND RIGHT HEART CATHETERIZATION WITH CORONARY ANGIOGRAM N/A 05/21/2014   Procedure: LEFT AND RIGHT HEART CATHETERIZATION WITH CORONARY ANGIOGRAM;  Surgeon: Rinaldo Cloud, MD;  Location: [facility] CATH LAB;  Service: Cardiovascular;  Laterality: N/A;  . LITHOTRIPSY    . TEE WITHOUT CARDIOVERSION N/A 07/02/2014   Procedure: TRANSESOPHAGEAL ECHOCARDIOGRAM (TEE);  Surgeon: Tonny Bollman, MD;  Location: [facility] OR;  Service: Open Heart Surgery;  Laterality: N/A;  . TRANSCATHETER AORTIC VALVE REPLACEMENT, TRANSFEMORAL N/A 07/02/2014   Procedure: TRANSCATHETER AORTIC VALVE REPLACEMENT, TRANSFEMORAL;  Surgeon: Tonny Bollman, MD;  Location: [facility] OR;  Service: Open Heart Surgery;  Laterality: N/A;  TAVR-TF APPROACH ( VALVE)    Social History   Socioeconomic History  . Marital status: Married    Spouse name: Not on file  . Number of children: Not on file  . Years of education: Not  on file  . Highest education level: Not on file  Occupational History  . Not on file  Tobacco Use  . Smoking status: Never Smoker  . Smokeless tobacco: Never Used  Vaping Use  . Vaping Use: Never used  Substance and Sexual Activity  . Alcohol use: No    Alcohol/week: 0.0 standard drinks  . Drug use: No  . Sexual activity: Never  Other Topics Concern  . Not on file  Social History Narrative  . Not on file   Social Determinants of Health   Financial Resource Strain: Not on file  Food Insecurity: Not on file  Transportation Needs: Not on file  Physical Activity: Not on file  Stress: Not on file  Social Connections: Not on file   Intimate Partner Violence: Not on file    Family History  Problem Relation Age of Onset  . Hypertension Father   . AAA (abdominal aortic aneurysm) Father   . Cancer Mother     Current Outpatient Medications  Medication Sig Dispense Refill  . amLODipine (NORVASC) 5 MG tablet Take 5 mg by mouth in the morning and at bedtime.     . clopidogrel (PLAVIX) 75 MG tablet Take 75 mg by mouth daily.    Marland Kitchen glimepiride (AMARYL) 2 MG tablet Take 4 mg by mouth daily.     Marland Kitchen ibuprofen (ADVIL) 200 MG tablet Take 200 mg by mouth every 6 (six) hours as needed for mild pain.    . Ibuprofen-diphenhydrAMINE Cit (ADVIL PM) 200-38 MG TABS Take 1-2 tablets by mouth at bedtime as needed (pain, sleep).    Marland Kitchen lisinopril (PRINIVIL,ZESTRIL) 40 MG tablet Take 40 mg by mouth in the morning and at bedtime.     . metFORMIN (GLUCOPHAGE) 500 MG tablet Take 500 mg by mouth in the morning, at noon, in the evening, and at bedtime.     . rosuvastatin (CRESTOR) 20 MG tablet Take 20 mg by mouth daily.    Marland Kitchen HYDROCODONE-APAP-DIETARY PROD PO      No current facility-administered medications for this visit.    No Known Allergies   REVIEW OF SYSTEMS:  [X]  denotes positive finding, [ ]  denotes negative finding Cardiac  Comments:  Chest pain or chest pressure:    Shortness of breath upon exertion:    Short of breath when lying flat:    Irregular heart rhythm:        Vascular    Pain in calf, thigh, or hip brought on by ambulation:    Pain in feet at night that wakes you up from your sleep:     Blood clot in your veins:    Leg swelling:         Pulmonary    Oxygen at home:    Productive cough:     Wheezing:         Neurologic    Sudden weakness in arms or legs:     Sudden numbness in arms or legs:     Sudden onset of difficulty speaking or slurred speech:    Temporary loss of vision in one eye:     Problems with dizziness:         Gastrointestinal    Blood in stool:     Vomited blood:         Genitourinary     Burning when urinating:     Blood in urine:        Psychiatric    Major depression:  Hematologic    Bleeding problems:    Problems with blood clotting too easily:        Skin    Rashes or ulcers:        Constitutional    Fever or chills:      PHYSICAL EXAMINATION:  Vitals:   01/07/20 0842  BP: (!) 152/78  Pulse: 74  Resp: 20  Temp: 98.4 F (36.9 C)  TempSrc: Temporal  SpO2: 98%  Weight: 188 lb 1.6 oz (85.3 kg)  Height: 5\' 7"  (1.702 m)    General:  WDWN in NAD; vital signs documented above Gait: Normal HENT: WNL, normocephalic Pulmonary: normal non-labored breathing , without Rales, rhonchi,  wheezing Cardiac: regular HR, without  Murmurs without carotid bruit Abdomen: soft, NT, no masses. No palpable AAA Vascular Exam/Pulses:  Right Left  Radial 2+ (normal) 2+ (normal)  Femoral 2+ (normal) 2+ (normal)  Popliteal 2+ (normal) 2+ (normal)  DP 2+ (normal) 2+ (normal)  PT 1+ (weak) 1+ (weak)   Extremities: without ischemic changes, without Gangrene , without cellulitis; without open wounds;  Musculoskeletal: no muscle wasting or atrophy  Neurologic: A&O X 3;  No focal weakness or paresthesias are detected Psychiatric:  The pt has Normal affect.   Non-Invasive Vascular Imaging:  01/07/20 EVAR Duplex: Abdominal Aorta: There is evidence of abnormal dilatation of the distal  Abdominal aorta. Patent endovascular aneurysm repair with evidence of endoleak. The largest aortic diameter has increased compared to prior exam. Previous diameter measurement was 4.4 cm obtained on 10/25/2017.   CTA abdomen and pelvis 07/09/19: VASCULAR  Aorta: Moderate calcified atheromatous plaque in the visualized distal descending thoracic and suprarenal segments. Patent bifurcated infrarenal stent graft. Persistent type 2 endoleak. Native sac diameter 5 x 4.8 cm (previously 4.7 x 4.5). No dissection or stenosis.  Celiac: Patent without evidence of aneurysm, dissection,  vasculitis or significant stenosis.  SMA: Calcified ostial plaque without high-grade stenosis. Patent distally with classic distal branch anatomy.  Renals: Duplicated left, inferior dominant, both patent. Duplicated right, superior dominant, both patent.  IMA: Continued patency nearly to its origin suggesting possible retrograde flow contributing to endoleak.  Inflow: Stent graft limbs extend to distal common iliac arteries, well apposed. Scattered calcified plaque in the native iliac arterial system without aneurysm or stenosis.  Proximal Outflow: Bilateral common femoral and visualized portions of the superficial and profunda femoral arteries are patent without evidence of aneurysm, dissection, vasculitis or significant stenosis.  Veins: No obvious venous abnormality within the limitations of this arterial phase study.   ASSESSMENT/PLAN:: 86 y.o. male here for follow up for EVAR. He has a known stable type 2 endoleak. He is asymptomatic.  On duplex today it has gotten larger since prior duplex. However, he had CTA abdomen/ pelvis in June of 2021 for diverticulitis which showed a stable Type II Endoleak with sac size of 5 x 4.8 which is equal in size to sac size visualized on duplex today( 4.83 x 5 cm). I reviewed signs and symptoms of aneurysm rupture/ enlargement and should he experience these he needs to seek immediate medical attention. Otherwise he will return in 12 months with repeat EVAR duplex and I will have him follow up with Dr. 06-25-2004, PA-C [facility] [phone]  On call MD: Dr. [phone]

## 2021-01-21 ENCOUNTER — Encounter (HOSPITAL_COMMUNITY): Payer: Self-pay | Admitting: *Deleted

## 2021-01-21 ENCOUNTER — Emergency Department (HOSPITAL_COMMUNITY): Payer: Medicare Other

## 2021-01-21 ENCOUNTER — Inpatient Hospital Stay (HOSPITAL_COMMUNITY)
Admission: EM | Admit: 2021-01-21 | Discharge: 2021-02-25 | DRG: 177 | Disposition: E | Payer: Medicare Other | Attending: Student | Admitting: Student

## 2021-01-21 ENCOUNTER — Observation Stay (HOSPITAL_COMMUNITY): Payer: Medicare Other

## 2021-01-21 ENCOUNTER — Other Ambulatory Visit: Payer: Self-pay

## 2021-01-21 DIAGNOSIS — I959 Hypotension, unspecified: Secondary | ICD-10-CM | POA: Diagnosis not present

## 2021-01-21 DIAGNOSIS — Z87442 Personal history of urinary calculi: Secondary | ICD-10-CM

## 2021-01-21 DIAGNOSIS — Z6826 Body mass index (BMI) 26.0-26.9, adult: Secondary | ICD-10-CM

## 2021-01-21 DIAGNOSIS — U071 COVID-19: Principal | ICD-10-CM | POA: Diagnosis present

## 2021-01-21 DIAGNOSIS — I4891 Unspecified atrial fibrillation: Secondary | ICD-10-CM | POA: Diagnosis present

## 2021-01-21 DIAGNOSIS — R627 Adult failure to thrive: Secondary | ICD-10-CM | POA: Diagnosis present

## 2021-01-21 DIAGNOSIS — E861 Hypovolemia: Secondary | ICD-10-CM | POA: Diagnosis present

## 2021-01-21 DIAGNOSIS — N179 Acute kidney failure, unspecified: Secondary | ICD-10-CM | POA: Diagnosis present

## 2021-01-21 DIAGNOSIS — I441 Atrioventricular block, second degree: Secondary | ICD-10-CM | POA: Diagnosis present

## 2021-01-21 DIAGNOSIS — I248 Other forms of acute ischemic heart disease: Secondary | ICD-10-CM | POA: Diagnosis present

## 2021-01-21 DIAGNOSIS — Z7984 Long term (current) use of oral hypoglycemic drugs: Secondary | ICD-10-CM

## 2021-01-21 DIAGNOSIS — Z4659 Encounter for fitting and adjustment of other gastrointestinal appliance and device: Secondary | ICD-10-CM

## 2021-01-21 DIAGNOSIS — G9349 Other encephalopathy: Secondary | ICD-10-CM

## 2021-01-21 DIAGNOSIS — J9600 Acute respiratory failure, unspecified whether with hypoxia or hypercapnia: Secondary | ICD-10-CM

## 2021-01-21 DIAGNOSIS — Z955 Presence of coronary angioplasty implant and graft: Secondary | ICD-10-CM

## 2021-01-21 DIAGNOSIS — Z79899 Other long term (current) drug therapy: Secondary | ICD-10-CM

## 2021-01-21 DIAGNOSIS — E878 Other disorders of electrolyte and fluid balance, not elsewhere classified: Secondary | ICD-10-CM | POA: Diagnosis present

## 2021-01-21 DIAGNOSIS — R1310 Dysphagia, unspecified: Secondary | ICD-10-CM | POA: Diagnosis present

## 2021-01-21 DIAGNOSIS — Z952 Presence of prosthetic heart valve: Secondary | ICD-10-CM

## 2021-01-21 DIAGNOSIS — Z8249 Family history of ischemic heart disease and other diseases of the circulatory system: Secondary | ICD-10-CM

## 2021-01-21 DIAGNOSIS — Z66 Do not resuscitate: Secondary | ICD-10-CM | POA: Diagnosis not present

## 2021-01-21 DIAGNOSIS — F05 Delirium due to known physiological condition: Secondary | ICD-10-CM | POA: Diagnosis not present

## 2021-01-21 DIAGNOSIS — E785 Hyperlipidemia, unspecified: Secondary | ICD-10-CM | POA: Diagnosis present

## 2021-01-21 DIAGNOSIS — I252 Old myocardial infarction: Secondary | ICD-10-CM

## 2021-01-21 DIAGNOSIS — Z28310 Unvaccinated for covid-19: Secondary | ICD-10-CM

## 2021-01-21 DIAGNOSIS — E870 Hyperosmolality and hypernatremia: Secondary | ICD-10-CM | POA: Diagnosis not present

## 2021-01-21 DIAGNOSIS — Z7902 Long term (current) use of antithrombotics/antiplatelets: Secondary | ICD-10-CM

## 2021-01-21 DIAGNOSIS — I129 Hypertensive chronic kidney disease with stage 1 through stage 4 chronic kidney disease, or unspecified chronic kidney disease: Secondary | ICD-10-CM | POA: Diagnosis present

## 2021-01-21 DIAGNOSIS — G9341 Metabolic encephalopathy: Secondary | ICD-10-CM | POA: Diagnosis present

## 2021-01-21 DIAGNOSIS — J1282 Pneumonia due to coronavirus disease 2019: Secondary | ICD-10-CM | POA: Diagnosis present

## 2021-01-21 DIAGNOSIS — N1832 Chronic kidney disease, stage 3b: Secondary | ICD-10-CM | POA: Diagnosis present

## 2021-01-21 DIAGNOSIS — E43 Unspecified severe protein-calorie malnutrition: Secondary | ICD-10-CM | POA: Diagnosis present

## 2021-01-21 DIAGNOSIS — J9601 Acute respiratory failure with hypoxia: Secondary | ICD-10-CM | POA: Diagnosis not present

## 2021-01-21 DIAGNOSIS — R0902 Hypoxemia: Secondary | ICD-10-CM

## 2021-01-21 DIAGNOSIS — I2510 Atherosclerotic heart disease of native coronary artery without angina pectoris: Secondary | ICD-10-CM | POA: Diagnosis present

## 2021-01-21 DIAGNOSIS — E860 Dehydration: Secondary | ICD-10-CM | POA: Diagnosis present

## 2021-01-21 DIAGNOSIS — Z781 Physical restraint status: Secondary | ICD-10-CM

## 2021-01-21 DIAGNOSIS — E1165 Type 2 diabetes mellitus with hyperglycemia: Secondary | ICD-10-CM | POA: Diagnosis present

## 2021-01-21 LAB — RESP PANEL BY RT-PCR (FLU A&B, COVID) ARPGX2
Influenza A by PCR: NEGATIVE
Influenza B by PCR: NEGATIVE
SARS Coronavirus 2 by RT PCR: POSITIVE — AB

## 2021-01-21 LAB — HEPATIC FUNCTION PANEL
ALT: 46 U/L — ABNORMAL HIGH (ref 0–44)
AST: 41 U/L (ref 15–41)
Albumin: 3.7 g/dL (ref 3.5–5.0)
Alkaline Phosphatase: 72 U/L (ref 38–126)
Bilirubin, Direct: 0.2 mg/dL (ref 0.0–0.2)
Indirect Bilirubin: 0.8 mg/dL (ref 0.3–0.9)
Total Bilirubin: 1 mg/dL (ref 0.3–1.2)
Total Protein: 6.6 g/dL (ref 6.5–8.1)

## 2021-01-21 LAB — BASIC METABOLIC PANEL
Anion gap: 11 (ref 5–15)
BUN: 35 mg/dL — ABNORMAL HIGH (ref 8–23)
CO2: 18 mmol/L — ABNORMAL LOW (ref 22–32)
Calcium: 8.8 mg/dL — ABNORMAL LOW (ref 8.9–10.3)
Chloride: 110 mmol/L (ref 98–111)
Creatinine, Ser: 2.22 mg/dL — ABNORMAL HIGH (ref 0.61–1.24)
GFR, Estimated: 28 mL/min — ABNORMAL LOW (ref >60)
Glucose, Bld: 143 mg/dL — ABNORMAL HIGH (ref 70–99)
Potassium: 3.7 mmol/L (ref 3.5–5.1)
Sodium: 139 mmol/L (ref 135–145)

## 2021-01-21 LAB — CBC
HCT: 46.1 % (ref 39.0–52.0)
Hemoglobin: 15.7 g/dL (ref 13.0–17.0)
MCH: 31.3 pg (ref 26.0–34.0)
MCHC: 34.1 g/dL (ref 30.0–36.0)
MCV: 92 fL (ref 80.0–100.0)
Platelets: 110 10*3/uL — ABNORMAL LOW (ref 150–400)
RBC: 5.01 MIL/uL (ref 4.22–5.81)
RDW: 13.6 % (ref 11.5–15.5)
WBC: 5.3 10*3/uL (ref 4.0–10.5)
nRBC: 0 % (ref 0.0–0.2)

## 2021-01-21 LAB — TROPONIN I (HIGH SENSITIVITY)
Troponin I (High Sensitivity): 35 ng/L — ABNORMAL HIGH (ref <18)
Troponin I (High Sensitivity): 36 ng/L — ABNORMAL HIGH (ref <18)

## 2021-01-21 LAB — BRAIN NATRIURETIC PEPTIDE: B Natriuretic Peptide: 50.4 pg/mL (ref 0.0–100.0)

## 2021-01-21 MED ORDER — SODIUM CHLORIDE 0.9 % IV BOLUS
1000.0000 mL | Freq: Once | INTRAVENOUS | Status: AC
  Administered 2021-01-21: 17:00:00 1000 mL via INTRAVENOUS

## 2021-01-21 MED ORDER — HALOPERIDOL LACTATE 5 MG/ML IJ SOLN
2.5000 mg | Freq: Once | INTRAMUSCULAR | Status: AC
  Administered 2021-01-21: 2.5 mg via INTRAVENOUS
  Filled 2021-01-21: qty 1

## 2021-01-21 MED ORDER — SODIUM CHLORIDE 0.9 % IV SOLN
100.0000 mg | Freq: Every day | INTRAVENOUS | Status: AC
  Administered 2021-01-22 – 2021-01-25 (×4): 100 mg via INTRAVENOUS
  Filled 2021-01-21 (×4): qty 20

## 2021-01-21 MED ORDER — INSULIN ASPART 100 UNIT/ML IJ SOLN
0.0000 [IU] | Freq: Three times a day (TID) | INTRAMUSCULAR | Status: DC
  Administered 2021-01-22: 08:00:00 3 [IU] via SUBCUTANEOUS
  Administered 2021-01-22 (×2): 2 [IU] via SUBCUTANEOUS
  Administered 2021-01-23: 14:00:00 3 [IU] via SUBCUTANEOUS
  Administered 2021-01-23: 18:00:00 5 [IU] via SUBCUTANEOUS
  Administered 2021-01-23: 09:00:00 3 [IU] via SUBCUTANEOUS
  Administered 2021-01-24: 5 [IU] via SUBCUTANEOUS
  Administered 2021-01-24: 7 [IU] via SUBCUTANEOUS
  Administered 2021-01-24: 5 [IU] via SUBCUTANEOUS
  Administered 2021-01-25: 2 [IU] via SUBCUTANEOUS
  Administered 2021-01-25 (×2): 5 [IU] via SUBCUTANEOUS
  Administered 2021-01-26: 2 [IU] via SUBCUTANEOUS
  Administered 2021-01-26: 3 [IU] via SUBCUTANEOUS
  Administered 2021-01-26: 5 [IU] via SUBCUTANEOUS

## 2021-01-21 MED ORDER — CLOPIDOGREL BISULFATE 75 MG PO TABS
75.0000 mg | ORAL_TABLET | Freq: Every day | ORAL | Status: DC
  Administered 2021-01-21 – 2021-01-27 (×5): 75 mg via ORAL
  Filled 2021-01-21 (×6): qty 1

## 2021-01-21 MED ORDER — SODIUM CHLORIDE 0.9 % IV SOLN
200.0000 mg | Freq: Once | INTRAVENOUS | Status: AC
  Administered 2021-01-21: 22:00:00 200 mg via INTRAVENOUS
  Filled 2021-01-21: qty 40

## 2021-01-21 MED ORDER — IPRATROPIUM-ALBUTEROL 20-100 MCG/ACT IN AERS
1.0000 | INHALATION_SPRAY | Freq: Four times a day (QID) | RESPIRATORY_TRACT | Status: DC
  Administered 2021-01-21 – 2021-01-23 (×5): 1 via RESPIRATORY_TRACT
  Filled 2021-01-21: qty 4

## 2021-01-21 MED ORDER — ROSUVASTATIN CALCIUM 20 MG PO TABS
20.0000 mg | ORAL_TABLET | Freq: Every day | ORAL | Status: DC
  Administered 2021-01-21 – 2021-01-27 (×5): 20 mg via ORAL
  Filled 2021-01-21 (×6): qty 1

## 2021-01-21 MED ORDER — HYDRALAZINE HCL 25 MG PO TABS
25.0000 mg | ORAL_TABLET | Freq: Four times a day (QID) | ORAL | Status: DC | PRN
  Administered 2021-01-22: 22:00:00 25 mg via ORAL
  Filled 2021-01-21 (×2): qty 1

## 2021-01-21 MED ORDER — HEPARIN SODIUM (PORCINE) 5000 UNIT/ML IJ SOLN
5000.0000 [IU] | Freq: Three times a day (TID) | INTRAMUSCULAR | Status: DC
  Administered 2021-01-21 – 2021-01-29 (×23): 5000 [IU] via SUBCUTANEOUS
  Filled 2021-01-21 (×24): qty 1

## 2021-01-21 MED ORDER — AMLODIPINE BESYLATE 5 MG PO TABS
5.0000 mg | ORAL_TABLET | Freq: Every day | ORAL | Status: DC
  Administered 2021-01-21 – 2021-01-26 (×4): 5 mg via ORAL
  Filled 2021-01-21 (×5): qty 1

## 2021-01-21 MED ORDER — DEXAMETHASONE 6 MG PO TABS
6.0000 mg | ORAL_TABLET | ORAL | Status: DC
  Administered 2021-01-21 – 2021-01-26 (×6): 6 mg via ORAL
  Filled 2021-01-21: qty 2
  Filled 2021-01-21 (×6): qty 1

## 2021-01-21 MED ORDER — SODIUM CHLORIDE 0.9 % IV SOLN: INTRAVENOUS | Status: AC

## 2021-01-21 NOTE — H&P (Signed)
History and Physical    Michael Wolfe [id number] DOB: 1931/11/27 DOA: 01/12/2021  PCP: Charolette Forward, MD (Confirm with patient/family/NH records and if not entered, this has to be entered at [facility] point of entry) Patient coming from: Home  I have personally briefly reviewed patient's old medical records in [place]  Chief Complaint: Feeling cold  HPI: Michael Wolfe is a 87 y.o. male with medical history significant of AAA, HTN, CAD with stable angina, aortic stenosis, CKD stage II, HLD, IIDM, brought in by family member for increasing shortness of breath, chest pain/epigastric pain.  Patient is confused, unable to provide any history, most history provided by patient son over the phone.  Patient lives with his wife, with son lives 1 hour away.  Last 2 days, patient has had increasing shortness of breath along with chest/epigastric pain, has had poor oral intake for the last 2 days " anything he ate will came straight up right away".  No fever or chills, no diarrhea.  Wife also experienced similar symptoms since yesterday.  Son also reported at baseline patient has had increasing exertional shortness of breath but never complained about chest pains until 2 days ago.  Patient never had COVID vaccination.  ED Course: No hypoxia, very tachypneic.  CT chest abdomen pelvis showed stable aortic aneurysm, patchy peripheral infiltrates compatible with viral pneumonia.  COVID positive.  Creatinine 2.2 compared to 1.81-year ago.  Review of Systems: Unable to perform, patient confused.  Past Medical History:  Diagnosis Date   AAA (abdominal aortic aneurysm)    Anginal pain ([place])    Arthritis    "left shoulder" (07/04/2014)   Constipation    Coronary artery disease    Depression    GERD (gastroesophageal reflux disease)    Headache    History of hiatal hernia    Hyperlipidemia    Hypertension    Inferior myocardial infarction [facility]) 2003   /notes 06/25/2014   Kidney stones X 1    Osteoarthritis    Pneumonia 1935; 1990's X 1   Severe aortic stenosis 05/06/2014   Shortness of breath dyspnea    Type II diabetes mellitus ([place])     Past Surgical History:  Procedure Laterality Date   ABDOMINAL AORTIC ENDOVASCULAR STENT GRAFT N/A 08/22/2014   Procedure: ABDOMINAL AORTIC ENDOVASCULAR STENT GRAFT- GORE ;  Surgeon: Serafina Mitchell, MD;  Location: [facility] OR;  Service: Vascular;  Laterality: N/A;   CARDIAC CATHETERIZATION  2003; 2016   Archie Endo 06/25/2014   CARDIAC VALVE REPLACEMENT     COLONOSCOPY     CORONARY ANGIOGRAM  09/12/2012   Procedure: CORONARY ANGIOGRAM;  Surgeon: Clent Demark, MD;  Location: [place] CATH LAB;  Service: Cardiovascular;;   CORONARY ANGIOPLASTY WITH STENT PLACEMENT  2009   Archie Endo 06/25/2014   LEFT AND RIGHT HEART CATHETERIZATION WITH CORONARY ANGIOGRAM N/A 05/21/2014   Procedure: LEFT AND RIGHT HEART CATHETERIZATION WITH CORONARY ANGIOGRAM;  Surgeon: Charolette Forward, MD;  Location: [facility] CATH LAB;  Service: Cardiovascular;  Laterality: N/A;   LITHOTRIPSY     TEE WITHOUT CARDIOVERSION N/A 07/02/2014   Procedure: TRANSESOPHAGEAL ECHOCARDIOGRAM (TEE);  Surgeon: Sherren Mocha, MD;  Location: [place];  Service: Open Heart Surgery;  Laterality: N/A;   TRANSCATHETER AORTIC VALVE REPLACEMENT, TRANSFEMORAL N/A 07/02/2014   Procedure: TRANSCATHETER AORTIC VALVE REPLACEMENT, TRANSFEMORAL;  Surgeon: Sherren Mocha, MD;  Location: [place];  Service: Open Heart Surgery;  Laterality: N/A;  TAVR-TF APPROACH (23MM VALVE)     reports that he has never  smoked. He has never used smokeless tobacco. He reports that he does not drink alcohol and does not use drugs.  No Known Allergies  Family History  Problem Relation Age of Onset   Hypertension Father    AAA (abdominal aortic aneurysm) Father    Cancer Mother      Prior to Admission medications   Medication Sig Start Date End Date Taking? Authorizing Provider  amLODipine (NORVASC) 5 MG tablet Take 5 mg by mouth in the morning and at  bedtime.     [provider]  clopidogrel (PLAVIX) 75 MG tablet Take 75 mg by mouth daily.    [provider]  glimepiride (AMARYL) 2 MG tablet Take 4 mg by mouth daily.     [provider]  HYDROCODONE-APAP-DIETARY PROD PO     [provider]  ibuprofen (ADVIL) 200 MG tablet Take 200 mg by mouth every 6 (six) hours as needed for mild pain.    [provider]  Ibuprofen-diphenhydrAMINE Cit (ADVIL PM) 200-38 MG TABS Take 1-2 tablets by mouth at bedtime as needed (pain, sleep).    [provider]  lisinopril (PRINIVIL,ZESTRIL) 40 MG tablet Take 40 mg by mouth in the morning and at bedtime.     [provider]  metFORMIN (GLUCOPHAGE) 500 MG tablet Take 500 mg by mouth in the morning, at noon, in the evening, and at bedtime.     [provider]  rosuvastatin (CRESTOR) 20 MG tablet Take 20 mg by mouth daily.    [provider]    Physical Exam: Vitals:   12/26/2020 1700 12/27/2020 1804 01/23/2021 1805 12/27/2020 1830  BP: (!) 151/98  (!) 144/79 (!) 154/122  Pulse: 72 75 72 73  Resp: (!) 31 (!) 21 (!) 26 (!) 32  Temp:      TempSrc:      SpO2: 100% 100% 100% 100%    Constitutional: NAD, calm, comfortable Vitals:   01/22/2021 1700 01/13/2021 1804 01/03/2021 1805 01/03/2021 1830  BP: (!) 151/98  (!) 144/79 (!) 154/122  Pulse: 72 75 72 73  Resp: (!) 31 (!) 21 (!) 26 (!) 32  Temp:      TempSrc:      SpO2: 100% 100% 100% 100%   Eyes: PERRL, lids and conjunctivae normal ENMT: Mucous membranes are dry. Posterior pharynx clear of any exudate or lesions.Normal dentition.  Neck: normal, supple, no masses, no thyromegaly Respiratory: clear to auscultation bilaterally, no wheezing, no crackles. Normal respiratory effort. No accessory muscle use.  Cardiovascular: Regular rate and rhythm, systolic murmur on heart base. No extremity edema. 2+ pedal pulses. No carotid bruits.  Abdomen: no tenderness, no masses palpated. No  hepatosplenomegaly. Bowel sounds positive.  Musculoskeletal: no clubbing / cyanosis. No joint deformity upper and lower extremities. Good ROM, no contractures. Normal muscle tone.  Skin: no rashes, lesions, ulcers. No induration Neurologic: No facial droops, moving all limbs, following simple commands Psychiatric: Awake, confused   Labs on Admission: I have personally reviewed following labs and imaging studies  CBC: Recent Labs  Lab 01/04/2021 1349  WBC 5.3  HGB 15.7  HCT 46.1  MCV 92.0  PLT 110*   Basic Metabolic Panel: Recent Labs  Lab 01/12/2021 1349  NA 139  K 3.7  CL 110  CO2 18*  GLUCOSE 143*  BUN 35*  CREATININE 2.22*  CALCIUM 8.8*   GFR: CrCl cannot be calculated (Unknown ideal weight.). Liver Function Tests: Recent Labs  Lab 01/05/2021 1349  AST 41  ALT 46*  ALKPHOS 72  BILITOT 1.0  PROT 6.6  ALBUMIN 3.7   No results for input(s): LIPASE, AMYLASE in the last 168 hours. No results for input(s): AMMONIA in the last 168 hours. Coagulation Profile: No results for input(s): INR, PROTIME in the last 168 hours. Cardiac Enzymes: No results for input(s): CKTOTAL, CKMB, CKMBINDEX, TROPONINI in the last 168 hours. BNP (last 3 results) No results for input(s): PROBNP in the last 8760 hours. HbA1C: No results for input(s): HGBA1C in the last 72 hours. CBG: No results for input(s): GLUCAP in the last 168 hours. Lipid Profile: No results for input(s): CHOL, HDL, LDLCALC, TRIG, CHOLHDL, LDLDIRECT in the last 72 hours. Thyroid Function Tests: No results for input(s): TSH, T4TOTAL, FREET4, T3FREE, THYROIDAB in the last 72 hours. Anemia Panel: No results for input(s): VITAMINB12, FOLATE, FERRITIN, TIBC, IRON, RETICCTPCT in the last 72 hours. Urine analysis:    Component Value Date/Time   COLORURINE STRAW (A) 07/09/2019 1200   APPEARANCEUR CLEAR 07/09/2019 1200   LABSPEC 1.027 07/09/2019 1200   PHURINE 5.0 07/09/2019 1200   GLUCOSEU NEGATIVE 07/09/2019 1200    HGBUR NEGATIVE 07/09/2019 1200   BILIRUBINUR NEGATIVE 07/09/2019 1200   KETONESUR NEGATIVE 07/09/2019 1200   PROTEINUR NEGATIVE 07/09/2019 1200   UROBILINOGEN 0.2 08/15/2014 0922   NITRITE NEGATIVE 07/09/2019 1200   LEUKOCYTESUR NEGATIVE 07/09/2019 1200    Radiological Exams on Admission: DG Chest 2 View  Result Date: 11-Feb-2021 CLINICAL DATA:  Shortness of breath and chest pain EXAM: CHEST - 2 VIEW COMPARISON:  06/28/2014 FINDINGS: Stable cardiomediastinal contours. Aortic atherosclerotic calcifications. Lung volumes are low. No pleural effusion or edema. No airspace consolidation. Remote right anterior fourth rib fracture deformity. IMPRESSION: 1. No acute findings. 2. Low lung volumes. Electronically Signed   By: Signa Kell M.D.   On: 2021-02-11 14:21   CT CHEST ABDOMEN PELVIS WO CONTRAST  Result Date: 02/11/2021 CLINICAL DATA:  Chronic cough, shortness of breath EXAM: CT CHEST, ABDOMEN AND PELVIS WITHOUT CONTRAST TECHNIQUE: Multidetector CT imaging of the chest, abdomen and pelvis was performed following the standard protocol without IV contrast. COMPARISON:  07/09/2019 FINDINGS: CT CHEST FINDINGS Cardiovascular: Extensive coronary artery calcifications are seen. There is prosthetic stent/valve in the aortic valve. There is mild ectasia of proximal descending thoracic aorta. Mediastinum/Nodes: No significant lymphadenopathy seen. Lungs/Pleura: There are small patchy ground-glass infiltrates in the periphery of both lungs, more so on the right side. There is no significant pleural effusion or pneumothorax. Musculoskeletal: No significant abnormality is seen. CT ABDOMEN PELVIS FINDINGS Hepatobiliary: There are multiple calcified gallbladder stones. Pancreas: No focal abnormality is seen. Spleen: Unremarkable. Adrenals/Urinary Tract: Adrenals are unremarkable. There is no hydronephrosis. There are few small bilateral renal stones each measuring less than 2 mm in size. There are possible small  parapelvic cysts in both kidneys. Ureters are not dilated. Urinary bladder is unremarkable. Stomach/Bowel: Stomach is unremarkable. Small bowel loops are not dilated. Appendix is not dilated. There is no significant wall thickening in colon. Scattered diverticula are seen in colon without signs of focal diverticulitis. Vascular/Lymphatic: There is previous endovascular stent repair of aortic aneurysm. Native aneurysm measures 4.9 x 5 cm with no significant change since 07/09/2019. There is no evidence of retroperitoneal hematoma. Reproductive: Unremarkable. Other: There is no ascites or pneumoperitoneum. Bilateral inguinal hernias containing fat are seen. Musculoskeletal: Decrease in height of bodies of L2 and L4 vertebrae has not changed. IMPRESSION: There are small scattered ground-glass infiltrates in the periphery of both lungs, more so  on the right side. Differential diagnostic possibilities would include interstitial pneumonitis or scarring. There is no focal pulmonary consolidation. There is no significant pleural effusion or pneumothorax. Extensive coronary artery calcifications are seen. Prosthetic valve/stent is seen in the aortic root. There is no evidence of intestinal obstruction or pneumoperitoneum. There is no hydronephrosis. Appendix is not dilated. There is previous endovascular stent repair of infrarenal aortic aneurysm. Overall size of the aneurysm has not changed significantly. There is no retroperitoneal hematoma. Gallbladder stones.  Diverticulosis of colon.  Renal stones. Other findings as described in the body of the report. Electronically Signed   By: Elmer Picker M.D.   On: 01/10/2021 18:24    EKG: Independently reviewed.  Chronic LBBB  Assessment/Plan Principal Problem:   COVID Active Problems:   COVID-19 virus infection  (please populate well all problems here in Problem List. (For example, if patient is on BP meds at home and you resume or decide to hold them, it is a  problem that needs to be her. Same for CAD, COPD, HLD and so on)  COVID PNA -Currently no hypoxia, but given the patient age and other comorbidities of CAD, CKD, diabetes, agreed with remdesivir. -Short course of p.o. steroid -Breathing treatment. -COVID labs  AKI on CKD -Likely from dehydration -Hold lisinopril -Start IV fluid, 100 mL/h x 12 hours then reevaluate. -CT abdomen pelvis showed no urinary obstructions.  Exertional dyspnea -Doubt this is related to COVID, given the history of aortic stenosis, will check echocardiogram.  Metabolic encephalopathy acute -No clear insult, other than COVID, will check head CT. -TSH and B12.  Elevated troponins -Trending is flat, ACS unlikely.  Likely related to demand ischemia related to COVID infection and AKI.  Recheck troponin level tomorrow, echocardiogram ordered.  HTN -Continue amlodipine, hold lisinopril, start as needed hydralazine.  IIDM -Hold p.o. diabetic medication, start sliding scale.  CAD -Continue Plavix and statin.  DVT prophylaxis: Heparin subcu Code Status: Full code, son is to bring in living will paper tomorrow Family Communication: Son over the phone Disposition Plan: Expect less 2 midnight hospital stay, PT evaluation ordered. Consults called: None Admission status: Tele obs   Lequita Halt MD [facility] Pager [phone]  01/03/2021, 7:31 PM

## 2021-01-21 NOTE — ED Notes (Signed)
Pt refused lab drawn per RN.

## 2021-01-21 NOTE — Progress Notes (Addendum)
HOSPITAL MEDICINE OVERNIGHT EVENT NOTE     Notified by nursing the patient continues to exhibit significant confusion throughout the evening.  Patient is interfering with medical care, presents is a fall risk and is pulling at medical devices.  Chart reviewed, patient presenting with encephalopathy which was presumed to be secondary to COVID per the admitting provider.  CT imaging of the head without contrast performed this evening however reveals an increasing area of encephalomalacia of the left posterior parietal white matter with radiology bringing up possibility of infarction or mass lesion.  MRI is recommended.  Will order 2.5 milligrams of intravenous Haldol in an attempt to address patient's agitation while additionally ordering an MRI brain with and without contrast to further evaluate the abnormality seen on CT imaging.  We will follow-up on MRI imaging and consult neurology based on results.  Marinda Elk  MD [facility]   ADDENDUM (12/29 12:30am)  Nurse reports that after the 2.5 mg of intravenous Haldol were administered attempts were made to get the patient over to MRI but were unsuccessful due to ongoing agitation.  Patient has now arrived on medical floor and continues to be agitated, frequently attempting to get out of bed and pulling at medical devices.  Patient is at high risk for falls.  Attending an additional dose of 2.5 mg of intravenous Haldol and will reassess.  If unsuccessful we will then try a trial of minimal restraints including mitts and lapbelt.  Deno Lunger Daleyssa Loiselle   ADDENDUM (12/29 2:10AM)  Unfortunately despite additional dosing of Haldol as well as usage of lapbelt and mitts patient is still placing himself at risk of fall and obstructing patient care.  No sitter is available according to nursing.  2 point soft wrist restraints additionally ordered.  Deno Lunger Reveca Desmarais

## 2021-01-21 NOTE — ED Triage Notes (Signed)
Brought to ED by his sons for eval of sob/cp for past 3 weeks. Per sons, pt has hx of stents. Would not come to ED unless they came from out of town to bring him. Sons are worried pt has decreased appetite. Pt alert. Occasionally complains of sob and cp.

## 2021-01-21 NOTE — ED Provider Notes (Signed)
[facility] EMERGENCY DEPARTMENT Provider Note   CSN: [id number] Arrival date & time: 2021/02/09  1202     History Chief Complaint  Patient presents with   Shortness of Breath   Chest Pain    Michael Wolfe is a 87 y.o. male history of AAA s/p repair, CAD status post stent on Plavix here presenting with shortness of breath.  Patient has chronic shortness of breath with exertion at baseline.  However for the last 3 weeks he has been having worsening shortness of breath with minimal exertion.  Patient has nonproductive cough as well.  Denies any fevers.  He states that he stays home all the time and denies any sick contacts with COVID.  He is brought here by his son.  He states that he is constipated for several days as well.  Of note, patient has been vomiting and unable to keep anything down for the last several days  The history is provided by the patient and a relative.      Past Medical History:  Diagnosis Date   AAA (abdominal aortic aneurysm)    Anginal pain (HCC)    Arthritis    "left shoulder" (07/04/2014)   Constipation    Coronary artery disease    Depression    GERD (gastroesophageal reflux disease)    Headache    History of hiatal hernia    Hyperlipidemia    Hypertension    Inferior myocardial infarction (HCC) 2003   /notes 06/25/2014   Kidney stones X 1   Osteoarthritis    Pneumonia 1935; 1990's X 1   Severe aortic stenosis 05/06/2014   Shortness of breath dyspnea    Type II diabetes mellitus (HCC)     Patient Active Problem List   Diagnosis Date Noted   AAA (abdominal aortic aneurysm) 08/22/2014   CAD (coronary artery disease) 08/12/2014   Abdominal aortic aneurysm 08/12/2014   Sinus bradycardia 08/12/2014   Aortic stenosis, severe 06/25/2014   Severe aortic stenosis 05/06/2014    Past Surgical History:  Procedure Laterality Date   ABDOMINAL AORTIC ENDOVASCULAR STENT GRAFT N/A 08/22/2014   Procedure: ABDOMINAL AORTIC ENDOVASCULAR  STENT GRAFT- GORE ;  Surgeon: Nada Libman, MD;  Location: [facility] OR;  Service: Vascular;  Laterality: N/A;   CARDIAC CATHETERIZATION  2003; 2016   Hattie Perch 06/25/2014   CARDIAC VALVE REPLACEMENT     COLONOSCOPY     CORONARY ANGIOGRAM  09/12/2012   Procedure: CORONARY ANGIOGRAM;  Surgeon: Robynn Pane, MD;  Location: [facility] CATH LAB;  Service: Cardiovascular;;   CORONARY ANGIOPLASTY WITH STENT PLACEMENT  2009   Hattie Perch 06/25/2014   LEFT AND RIGHT HEART CATHETERIZATION WITH CORONARY ANGIOGRAM N/A 05/21/2014   Procedure: LEFT AND RIGHT HEART CATHETERIZATION WITH CORONARY ANGIOGRAM;  Surgeon: Rinaldo Cloud, MD;  Location: [facility] CATH LAB;  Service: Cardiovascular;  Laterality: N/A;   LITHOTRIPSY     TEE WITHOUT CARDIOVERSION N/A 07/02/2014   Procedure: TRANSESOPHAGEAL ECHOCARDIOGRAM (TEE);  Surgeon: Tonny Bollman, MD;  Location: [facility] OR;  Service: Open Heart Surgery;  Laterality: N/A;   TRANSCATHETER AORTIC VALVE REPLACEMENT, TRANSFEMORAL N/A 07/02/2014   Procedure: TRANSCATHETER AORTIC VALVE REPLACEMENT, TRANSFEMORAL;  Surgeon: Tonny Bollman, MD;  Location: [facility] OR;  Service: Open Heart Surgery;  Laterality: N/A;  TAVR-TF APPROACH ( VALVE)       Family History  Problem Relation Age of Onset   Hypertension Father    AAA (abdominal aortic aneurysm) Father    Cancer Mother     Social  History   Tobacco Use   Smoking status: Never   Smokeless tobacco: Never  Vaping Use   Vaping Use: Never used  Substance Use Topics   Alcohol use: No    Alcohol/week: 0.0 standard drinks   Drug use: No    Home Medications Prior to Admission medications   Medication Sig Start Date End Date Taking? Authorizing Provider  amLODipine (NORVASC) 5 MG tablet Take 5 mg by mouth in the morning and at bedtime.     [provider]  clopidogrel (PLAVIX) 75 MG tablet Take 75 mg by mouth daily.    [provider]  glimepiride (AMARYL) 2 MG tablet Take 4 mg by mouth daily.     [provider]   HYDROCODONE-APAP-DIETARY PROD PO     [provider]  ibuprofen (ADVIL) 200 MG tablet Take 200 mg by mouth every 6 (six) hours as needed for mild pain.    [provider]  Ibuprofen-diphenhydrAMINE Cit (ADVIL PM) 200-38 MG TABS Take 1-2 tablets by mouth at bedtime as needed (pain, sleep).    [provider]  lisinopril (PRINIVIL,ZESTRIL) 40 MG tablet Take 40 mg by mouth in the morning and at bedtime.     [provider]  metFORMIN (GLUCOPHAGE) 500 MG tablet Take 500 mg by mouth in the morning, at noon, in the evening, and at bedtime.     [provider]  rosuvastatin (CRESTOR) 20 MG tablet Take 20 mg by mouth daily.    [provider]    Allergies    Patient has no known allergies.  Review of Systems   Review of Systems  Respiratory:  Positive for shortness of breath.   Cardiovascular:  Positive for chest pain.  All other systems reviewed and are negative.  Physical Exam Updated Vital Signs BP (!) 154/122    Pulse 73    Temp (!) 97.5 F (36.4 C) (Oral)    Resp (!) 32    SpO2 100%   Physical Exam Vitals and nursing note reviewed.  Constitutional:      Comments: Tachypneic and uncomfortable  HENT:     Head: Normocephalic.     Mouth/Throat:     Comments: Mucous membrane is dry Eyes:     Extraocular Movements: Extraocular movements intact.     Pupils: Pupils are equal, round, and reactive to light.  Cardiovascular:     Rate and Rhythm: Normal rate and regular rhythm.  Pulmonary:     Comments: Tachypneic, diminished breath sounds bilaterally Abdominal:     General: Bowel sounds are normal.     Palpations: Abdomen is soft.     Comments: Patient has questionable pulsatile mass  Musculoskeletal:        General: Normal range of motion.     Cervical back: Normal range of motion and neck supple.  Skin:    General: Skin is warm.     Capillary Refill: Capillary refill takes less than 2 seconds.  Neurological:     General: No  focal deficit present.     Mental Status: He is oriented to person, place, and time.  Psychiatric:        Mood and Affect: Mood normal.        Behavior: Behavior normal.    ED Results / Procedures / Treatments   Labs (all labs ordered are listed, but only abnormal results are displayed) Labs Reviewed  RESP PANEL BY RT-PCR (FLU A&B, COVID) ARPGX2 - Abnormal; Notable for the following components:  Result Value   SARS Coronavirus 2 by RT PCR POSITIVE (*)    All other components within normal limits  BASIC METABOLIC PANEL - Abnormal; Notable for the following components:   CO2 18 (*)    Glucose, Bld 143 (*)    BUN 35 (*)    Creatinine, Ser 2.22 (*)    Calcium 8.8 (*)    GFR, Estimated 28 (*)    All other components within normal limits  CBC - Abnormal; Notable for the following components:   Platelets 110 (*)    All other components within normal limits  HEPATIC FUNCTION PANEL - Abnormal; Notable for the following components:   ALT 46 (*)    All other components within normal limits  TROPONIN I (HIGH SENSITIVITY) - Abnormal; Notable for the following components:   Troponin I (High Sensitivity) 36 (*)    All other components within normal limits  TROPONIN I (HIGH SENSITIVITY) - Abnormal; Notable for the following components:   Troponin I (High Sensitivity) 35 (*)    All other components within normal limits  BRAIN NATRIURETIC PEPTIDE  URINALYSIS, ROUTINE W REFLEX MICROSCOPIC    EKG EKG Interpretation  Date/Time:  Wednesday 2021-02-10 16:41:51 EST Ventricular Rate:  78 PR Interval:  118 QRS Duration: 127 QT Interval:  411 QTC Calculation: 469 R Axis:   -48 Text Interpretation: Sinus rhythm Borderline short PR interval Left bundle branch block QTc normal Confirmed by Richardean Canal [phone]) on 2021-02-10 5:03:58 PM  Radiology DG Chest 2 View  Result Date: 02-10-21 CLINICAL DATA:  Shortness of breath and chest pain EXAM: CHEST - 2 VIEW COMPARISON:  06/28/2014  FINDINGS: Stable cardiomediastinal contours. Aortic atherosclerotic calcifications. Lung volumes are low. No pleural effusion or edema. No airspace consolidation. Remote right anterior fourth rib fracture deformity. IMPRESSION: 1. No acute findings. 2. Low lung volumes. Electronically Signed   By: Signa Kell M.D.   On: 2021/02/10 14:21   CT CHEST ABDOMEN PELVIS WO CONTRAST  Result Date: February 10, 2021 CLINICAL DATA:  Chronic cough, shortness of breath EXAM: CT CHEST, ABDOMEN AND PELVIS WITHOUT CONTRAST TECHNIQUE: Multidetector CT imaging of the chest, abdomen and pelvis was performed following the standard protocol without IV contrast. COMPARISON:  07/09/2019 FINDINGS: CT CHEST FINDINGS Cardiovascular: Extensive coronary artery calcifications are seen. There is prosthetic stent/valve in the aortic valve. There is mild ectasia of proximal descending thoracic aorta. Mediastinum/Nodes: No significant lymphadenopathy seen. Lungs/Pleura: There are small patchy ground-glass infiltrates in the periphery of both lungs, more so on the right side. There is no significant pleural effusion or pneumothorax. Musculoskeletal: No significant abnormality is seen. CT ABDOMEN PELVIS FINDINGS Hepatobiliary: There are multiple calcified gallbladder stones. Pancreas: No focal abnormality is seen. Spleen: Unremarkable. Adrenals/Urinary Tract: Adrenals are unremarkable. There is no hydronephrosis. There are few small bilateral renal stones each measuring less than 2 mm in size. There are possible small parapelvic cysts in both kidneys. Ureters are not dilated. Urinary bladder is unremarkable. Stomach/Bowel: Stomach is unremarkable. Small bowel loops are not dilated. Appendix is not dilated. There is no significant wall thickening in colon. Scattered diverticula are seen in colon without signs of focal diverticulitis. Vascular/Lymphatic: There is previous endovascular stent repair of aortic aneurysm. Native aneurysm measures 4.9 x 5 cm  with no significant change since 07/09/2019. There is no evidence of retroperitoneal hematoma. Reproductive: Unremarkable. Other: There is no ascites or pneumoperitoneum. Bilateral inguinal hernias containing fat are seen. Musculoskeletal: Decrease in height of bodies of L2 and L4  vertebrae has not changed. IMPRESSION: There are small scattered ground-glass infiltrates in the periphery of both lungs, more so on the right side. Differential diagnostic possibilities would include interstitial pneumonitis or scarring. There is no focal pulmonary consolidation. There is no significant pleural effusion or pneumothorax. Extensive coronary artery calcifications are seen. Prosthetic valve/stent is seen in the aortic root. There is no evidence of intestinal obstruction or pneumoperitoneum. There is no hydronephrosis. Appendix is not dilated. There is previous endovascular stent repair of infrarenal aortic aneurysm. Overall size of the aneurysm has not changed significantly. There is no retroperitoneal hematoma. Gallbladder stones.  Diverticulosis of colon.  Renal stones. Other findings as described in the body of the report. Electronically Signed   By: Ernie Avena M.D.   On: February 05, 2021 18:24    Procedures Procedures   Medications Ordered in ED Medications  remdesivir 200 mg in sodium chloride 0.9% 250 mL IVPB (has no administration in time range)    Followed by  remdesivir 100 mg in sodium chloride 0.9 % 100 mL IVPB (has no administration in time range)  sodium chloride 0.9 % bolus 1,000 mL (0 mLs Intravenous Stopped 02/05/21 1803)    ED Course  I have reviewed the triage vital signs and the nursing notes.  Pertinent labs & imaging results that were available during my care of the patient were reviewed by me and considered in my medical decision making (see chart for details).    MDM Rules/Calculators/A&P                         SERGEY ISHLER is a 87 y.o. male here with abdominal pain and  shortness of breath. Patient does have CAD with stents.  Consider ACS versus ruptured aortic aneurysm or endoleak.  Plan to get CT chest abdomen pelvis.  Plan to get CBC and CMP and troponin x2 and patient will likely need admission  6:56 PM Troponin is 35.  Patient is COVID-positive.  Patient also has AKI.  Has no oxygen requirement of the patient is very tachypneic.  I ordered remdesivir.  Hospitalist to admit for COVID with shortness of breath       Final Clinical Impression(s) / ED Diagnoses Final diagnoses:  None    Rx / DC Orders ED Discharge Orders     None        Charlynne Pander, MD 02-05-21 1857

## 2021-01-21 NOTE — ED Provider Notes (Signed)
Emergency Medicine Provider Triage Evaluation Note  Michael Wolfe , a 87 y.o. male  was evaluated in triage.  Pt complains of shortness of breath for last 3 weeks with some centralized chest pain, significant dyspnea on exertion, history of stents.  According to the medication bottles in the patient's bag, does not appear he is compliant with his antihypertensives or his anticoagulation..  Review of Systems  Positive: Chest pain or shortness of breath Negative: Nausea, vomiting or diarrhea  Physical Exam  BP 96/78 (BP Location: Right Arm)    Pulse 82    Temp (!) 97.5 F (36.4 C) (Oral)    Resp 18    SpO2 95%  Gen:   Awake, no distress   Resp:  Normal effort  MSK:   Moves extremities without difficulty  Other:  RRR systolic murmur, no gallops or rubs.  Lungs with coarse breath sounds throughout but no rales  Medical Decision Making  Medically screening exam initiated at 1:56 PM.  Appropriate orders placed.  Michael Wolfe was informed that the remainder of the evaluation will be completed by another provider, this initial triage assessment does not replace that evaluation, and the importance of remaining in the ED until their evaluation is complete.  Patient ill-appearing, significantly dyspneic with speech, charge RN made aware, patient to be prioritized for room.  This chart was dictated using voice recognition software, Dragon. Despite the best efforts of this provider to proofread and correct errors, errors may still occur which can change documentation meaning.    Paris Lore, PA-C 01/06/2021 1357    Franne Forts, DO 01/14/2021 2125

## 2021-01-22 ENCOUNTER — Observation Stay (HOSPITAL_COMMUNITY): Payer: Medicare Other

## 2021-01-22 DIAGNOSIS — I248 Other forms of acute ischemic heart disease: Secondary | ICD-10-CM | POA: Diagnosis present

## 2021-01-22 DIAGNOSIS — E1165 Type 2 diabetes mellitus with hyperglycemia: Secondary | ICD-10-CM | POA: Diagnosis present

## 2021-01-22 DIAGNOSIS — I2510 Atherosclerotic heart disease of native coronary artery without angina pectoris: Secondary | ICD-10-CM | POA: Diagnosis present

## 2021-01-22 DIAGNOSIS — G9349 Other encephalopathy: Secondary | ICD-10-CM | POA: Diagnosis not present

## 2021-01-22 DIAGNOSIS — F05 Delirium due to known physiological condition: Secondary | ICD-10-CM | POA: Diagnosis not present

## 2021-01-22 DIAGNOSIS — I252 Old myocardial infarction: Secondary | ICD-10-CM | POA: Diagnosis not present

## 2021-01-22 DIAGNOSIS — I129 Hypertensive chronic kidney disease with stage 1 through stage 4 chronic kidney disease, or unspecified chronic kidney disease: Secondary | ICD-10-CM | POA: Diagnosis present

## 2021-01-22 DIAGNOSIS — Z66 Do not resuscitate: Secondary | ICD-10-CM | POA: Diagnosis not present

## 2021-01-22 DIAGNOSIS — I350 Nonrheumatic aortic (valve) stenosis: Secondary | ICD-10-CM | POA: Diagnosis not present

## 2021-01-22 DIAGNOSIS — G9341 Metabolic encephalopathy: Secondary | ICD-10-CM | POA: Diagnosis present

## 2021-01-22 DIAGNOSIS — I959 Hypotension, unspecified: Secondary | ICD-10-CM | POA: Diagnosis not present

## 2021-01-22 DIAGNOSIS — E878 Other disorders of electrolyte and fluid balance, not elsewhere classified: Secondary | ICD-10-CM | POA: Diagnosis present

## 2021-01-22 DIAGNOSIS — I441 Atrioventricular block, second degree: Secondary | ICD-10-CM | POA: Diagnosis present

## 2021-01-22 DIAGNOSIS — R1310 Dysphagia, unspecified: Secondary | ICD-10-CM | POA: Diagnosis present

## 2021-01-22 DIAGNOSIS — J1282 Pneumonia due to coronavirus disease 2019: Secondary | ICD-10-CM | POA: Diagnosis present

## 2021-01-22 DIAGNOSIS — R410 Disorientation, unspecified: Secondary | ICD-10-CM

## 2021-01-22 DIAGNOSIS — E43 Unspecified severe protein-calorie malnutrition: Secondary | ICD-10-CM | POA: Diagnosis present

## 2021-01-22 DIAGNOSIS — E860 Dehydration: Secondary | ICD-10-CM | POA: Diagnosis present

## 2021-01-22 DIAGNOSIS — I4891 Unspecified atrial fibrillation: Secondary | ICD-10-CM | POA: Diagnosis present

## 2021-01-22 DIAGNOSIS — E870 Hyperosmolality and hypernatremia: Secondary | ICD-10-CM | POA: Diagnosis not present

## 2021-01-22 DIAGNOSIS — J9601 Acute respiratory failure with hypoxia: Secondary | ICD-10-CM | POA: Diagnosis not present

## 2021-01-22 DIAGNOSIS — N179 Acute kidney failure, unspecified: Secondary | ICD-10-CM | POA: Diagnosis present

## 2021-01-22 DIAGNOSIS — Z6826 Body mass index (BMI) 26.0-26.9, adult: Secondary | ICD-10-CM | POA: Diagnosis not present

## 2021-01-22 DIAGNOSIS — U071 COVID-19: Secondary | ICD-10-CM | POA: Diagnosis present

## 2021-01-22 DIAGNOSIS — Z952 Presence of prosthetic heart valve: Secondary | ICD-10-CM | POA: Diagnosis not present

## 2021-01-22 DIAGNOSIS — E861 Hypovolemia: Secondary | ICD-10-CM | POA: Diagnosis present

## 2021-01-22 DIAGNOSIS — N1832 Chronic kidney disease, stage 3b: Secondary | ICD-10-CM | POA: Diagnosis present

## 2021-01-22 LAB — ECHOCARDIOGRAM COMPLETE
AR max vel: 1.42 cm2
AV Area VTI: 1.22 cm2
AV Area mean vel: 1.36 cm2
AV Mean grad: 17 mmHg
AV Peak grad: 29.4 mmHg
Ao pk vel: 2.71 m/s
Height: 67 in
S' Lateral: 3.1 cm
Weight: 3026.47 oz

## 2021-01-22 LAB — URINALYSIS, COMPLETE (UACMP) WITH MICROSCOPIC
Bacteria, UA: NONE SEEN
Bilirubin Urine: NEGATIVE
Glucose, UA: 50 mg/dL — AB
Ketones, ur: 5 mg/dL — AB
Leukocytes,Ua: NEGATIVE
Nitrite: NEGATIVE
Protein, ur: 30 mg/dL — AB
Specific Gravity, Urine: 1.017 (ref 1.005–1.030)
pH: 5 (ref 5.0–8.0)

## 2021-01-22 LAB — CBC WITH DIFFERENTIAL/PLATELET
Abs Immature Granulocytes: 0.02 10*3/uL (ref 0.00–0.07)
Basophils Absolute: 0 10*3/uL (ref 0.0–0.1)
Basophils Relative: 0 %
Eosinophils Absolute: 0 10*3/uL (ref 0.0–0.5)
Eosinophils Relative: 0 %
HCT: 44.7 % (ref 39.0–52.0)
Hemoglobin: 15 g/dL (ref 13.0–17.0)
Immature Granulocytes: 0 %
Lymphocytes Relative: 14 %
Lymphs Abs: 0.7 10*3/uL (ref 0.7–4.0)
MCH: 31 pg (ref 26.0–34.0)
MCHC: 33.6 g/dL (ref 30.0–36.0)
MCV: 92.4 fL (ref 80.0–100.0)
Monocytes Absolute: 0.2 10*3/uL (ref 0.1–1.0)
Monocytes Relative: 5 %
Neutro Abs: 3.7 10*3/uL (ref 1.7–7.7)
Neutrophils Relative %: 81 %
Platelets: 98 10*3/uL — ABNORMAL LOW (ref 150–400)
RBC: 4.84 MIL/uL (ref 4.22–5.81)
RDW: 13.4 % (ref 11.5–15.5)
WBC: 4.6 10*3/uL (ref 4.0–10.5)
nRBC: 0 % (ref 0.0–0.2)

## 2021-01-22 LAB — COMPREHENSIVE METABOLIC PANEL
ALT: 40 U/L (ref 0–44)
AST: 37 U/L (ref 15–41)
Albumin: 3.6 g/dL (ref 3.5–5.0)
Alkaline Phosphatase: 71 U/L (ref 38–126)
Anion gap: 10 (ref 5–15)
BUN: 32 mg/dL — ABNORMAL HIGH (ref 8–23)
CO2: 17 mmol/L — ABNORMAL LOW (ref 22–32)
Calcium: 8.2 mg/dL — ABNORMAL LOW (ref 8.9–10.3)
Chloride: 111 mmol/L (ref 98–111)
Creatinine, Ser: 1.82 mg/dL — ABNORMAL HIGH (ref 0.61–1.24)
GFR, Estimated: 36 mL/min — ABNORMAL LOW (ref >60)
Glucose, Bld: 174 mg/dL — ABNORMAL HIGH (ref 70–99)
Potassium: 4 mmol/L (ref 3.5–5.1)
Sodium: 138 mmol/L (ref 135–145)
Total Bilirubin: 1 mg/dL (ref 0.3–1.2)
Total Protein: 6.2 g/dL — ABNORMAL LOW (ref 6.5–8.1)

## 2021-01-22 LAB — PROCALCITONIN: Procalcitonin: <0.1 ng/mL

## 2021-01-22 LAB — TROPONIN I (HIGH SENSITIVITY): Troponin I (High Sensitivity): 41 ng/L — ABNORMAL HIGH (ref <18)

## 2021-01-22 LAB — HEMOGLOBIN A1C
Hgb A1c MFr Bld: 8.2 % — ABNORMAL HIGH (ref 4.8–5.6)
Mean Plasma Glucose: 188.64 mg/dL

## 2021-01-22 LAB — VITAMIN B12: Vitamin B-12: 348 pg/mL (ref 180–914)

## 2021-01-22 LAB — GLUCOSE, CAPILLARY
Glucose-Capillary: 227 mg/dL — ABNORMAL HIGH (ref 70–99)
Glucose-Capillary: 183 mg/dL — ABNORMAL HIGH (ref 70–99)
Glucose-Capillary: 187 mg/dL — ABNORMAL HIGH (ref 70–99)
Glucose-Capillary: 185 mg/dL — ABNORMAL HIGH (ref 70–99)

## 2021-01-22 LAB — MAGNESIUM: Magnesium: 1.9 mg/dL (ref 1.7–2.4)

## 2021-01-22 LAB — FIBRINOGEN: Fibrinogen: 431 mg/dL (ref 210–475)

## 2021-01-22 LAB — D-DIMER, QUANTITATIVE: D-Dimer, Quant: 7.04 ug/mL-FEU — ABNORMAL HIGH (ref 0.00–0.50)

## 2021-01-22 LAB — LACTATE DEHYDROGENASE: LDH: 234 U/L — ABNORMAL HIGH (ref 98–192)

## 2021-01-22 LAB — C-REACTIVE PROTEIN: CRP: 0.6 mg/dL (ref <1.0)

## 2021-01-22 LAB — LACTIC ACID, PLASMA: Lactic Acid, Venous: 1.8 mmol/L (ref 0.5–1.9)

## 2021-01-22 LAB — FERRITIN: Ferritin: 465 ng/mL — ABNORMAL HIGH (ref 24–336)

## 2021-01-22 LAB — PHOSPHORUS: Phosphorus: 2.2 mg/dL — ABNORMAL LOW (ref 2.5–4.6)

## 2021-01-22 LAB — AMMONIA: Ammonia: 29 umol/L (ref 9–35)

## 2021-01-22 LAB — TSH: TSH: 1.334 u[IU]/mL (ref 0.350–4.500)

## 2021-01-22 MED ORDER — HALOPERIDOL LACTATE 5 MG/ML IJ SOLN
2.5000 mg | Freq: Once | INTRAMUSCULAR | Status: AC
  Administered 2021-01-22: 01:00:00 2.5 mg via INTRAVENOUS
  Filled 2021-01-22: qty 1

## 2021-01-22 MED ORDER — PERFLUTREN LIPID MICROSPHERE
1.0000 mL | INTRAVENOUS | Status: AC | PRN
  Administered 2021-01-22: 10:00:00 3 mL via INTRAVENOUS
  Filled 2021-01-22: qty 10

## 2021-01-22 MED ORDER — HALOPERIDOL LACTATE 5 MG/ML IJ SOLN
2.5000 mg | Freq: Once | INTRAMUSCULAR | Status: AC
  Administered 2021-01-22: 2.5 mg via INTRAVENOUS
  Filled 2021-01-22: qty 1

## 2021-01-22 MED ORDER — CYANOCOBALAMIN 500 MCG PO TABS
250.0000 ug | ORAL_TABLET | Freq: Every day | ORAL | Status: DC
Start: 2021-01-22 — End: 2021-01-28
  Administered 2021-01-22 – 2021-01-27 (×4): 250 ug via ORAL
  Filled 2021-01-22 (×7): qty 1

## 2021-01-22 NOTE — Progress Notes (Addendum)
HOSPITAL MEDICINE OVERNIGHT EVENT NOTE    Nursing reports patient is exhibiting increasing agitation, similar to agitation last night.  Will administer Haldol 2.5 mg IV x1.  Marinda Elk  MD [facility]   ADDENDUM 1AM 12/30  EKG obtained due to intravenous Haldol being administered.  EKG revealing prolonged QTC of 559.  Abstaining from any further QT prolonging agents.  Obtaining electrolytes and replacing as necessary.  Continue to monitor on telemetry.  Deno Lunger Nara Paternoster  ADDENDUM (12/30 2:50am)  Nursing states that the patient continues to be quite agitated despite Haldol being given earlier in the evening.  Unable to administer more doses due to prolonged QT.  We will try a trial of 0.5 mg of intravenous Ativan.  Deno Lunger Brynlee Pennywell

## 2021-01-22 NOTE — ED Notes (Signed)
Pt noted to be up out of bed, wandering aimlessly in room, stating he is looking for his kitchen sink. Pt redirected to bed by this RN.. Male purewick secured to body with brief. Pt reminded that device is in place to help with toileting needs. Will continue to monitor.

## 2021-01-22 NOTE — Progress Notes (Signed)
MRI notified pt coming down for Ct via stretcher with transporter and Nurse Tech. With monitor for tele and vital sign

## 2021-01-22 NOTE — Evaluation (Signed)
Physical Therapy Evaluation Patient Details Name: Michael Wolfe MRN: [id number] DOB: 1931-06-03 Today's Date: 01/22/2021  History of Present Illness  87 y.o. male brought in by family member 01/05/2021 for increasing shortness of breath, chest pain/epigastric pain, vomiting. COVID+, encephalopathy; CT head "encephalomalacia in the left posterior parietal white matter. MRI correlation is suggested to exclude infarct or mass lesion." MRI pending  PMH significant of AAA, HTN, CAD with stable angina, aortic stenosis, CKD stage II, HLD, IIDM  Clinical Impression   Pt admitted secondary to problem above with deficits below. Patient's prior functional status is unknown as he is confused and no family present. Pt currently requires moderate assist to transfer from bed to [facility] and was only able to safely walk 3 feet with one person assist. For pt to return home, he would need 24 hour care with up to moderate assist for mobility. Will continue to monitor discharge needs/recommendations as, hopefully, his condition improves.  Anticipate patient will benefit from PT to address problems listed below.Will continue to follow acutely to maximize functional mobility independence and safety.          Recommendations for follow up therapy are one component of a multi-disciplinary discharge planning process, led by the attending physician.  Recommendations may be updated based on patient status, additional functional criteria and insurance authorization.  Follow Up Recommendations Skilled nursing-short term rehab (<3 hours/day)    Assistance Recommended at Discharge Frequent or constant Supervision/Assistance  Functional Status Assessment Patient has had a recent decline in their functional status and demonstrates the ability to make significant improvements in function in a reasonable and predictable amount of time.  Equipment Recommendations  Other (comment) (TBD if improves and SNF not needed)     Recommendations for Other Services OT consult     Precautions / Restrictions Precautions Precautions: Fall Precaution Comments: pt confused and could not relay if he has had falls in past 6 months      Mobility  Bed Mobility Overal bed mobility: Needs Assistance Bed Mobility: Rolling;Sidelying to Sit;Sit to Sidelying Rolling: Min assist Sidelying to sit: Min assist     Sit to sidelying: Min assist General bed mobility comments: max cues and min assist to roll; assist to raise torso to upright; assist to return to sidelying (torso and legs)    Transfers Overall transfer level: Needs assistance Equipment used: None Transfers: Sit to/from Stand;Bed to chair/wheelchair/BSC Sit to Stand: Min assist   Step pivot transfers: Mod assist       General transfer comment: bed to [facility] with incr assist to direct his hips to fully turn and align with BSC prior to sitting; return to Bed with stand min assist from [facility] and ambulated several steps forward to pivot and sit with min assist    Ambulation/Gait Ambulation/Gait assistance: Min assist Gait Distance (Feet): 3 Feet Assistive device: 1 person hand held assist Gait Pattern/deviations: Step-to pattern;Decreased stride length;Shuffle       General Gait Details: walked forward from [facility] to return to bed  Stairs            Wheelchair Mobility    Modified Rankin (Stroke Patients Only)       Balance Overall balance assessment: Mild deficits observed, not formally tested                                           Pertinent Vitals/Pain  Pain Assessment: No/denies pain    Home Living                     Additional Comments: pt too confused to answer questions; per H&P lives with wife    Prior Function Prior Level of Function : Patient poor historian/Family not available                     Hand Dominance        Extremity/Trunk Assessment   Upper Extremity Assessment Upper  Extremity Assessment: Generalized weakness    Lower Extremity Assessment Lower Extremity Assessment: Generalized weakness    Cervical / Trunk Assessment Cervical / Trunk Assessment: Normal  Communication   Communication: HOH  Cognition Arousal/Alertness: Awake/alert Behavior During Therapy: Restless Overall Cognitive Status: No family/caregiver present to determine baseline cognitive functioning                                 General Comments: pt in bil wrist and mitten restraints, moving legs to remove covers on arrival; oriented to self only        General Comments General comments (skin integrity, edema, etc.): Pt reporting he needed to use bathroom on arrival. Pt with primofit for urine, but stating he needed to have BM. He was able to follow most simple cues to get OOB to [facility] and back to bed (was not able to have BM).    Exercises     Assessment/Plan    PT Assessment Patient needs continued PT services  PT Problem List Decreased strength;Decreased balance;Decreased mobility;Decreased cognition;Decreased knowledge of use of DME;Decreased safety awareness;Decreased knowledge of precautions       PT Treatment Interventions DME instruction;Gait training;Functional mobility training;Therapeutic activities;Therapeutic exercise;Balance training;Cognitive remediation;Patient/family education    PT Goals (Current goals can be found in the Care Plan section)  Acute Rehab PT Goals Patient Stated Goal: pt unable PT Goal Formulation: Patient unable to participate in goal setting Time For Goal Achievement: 02/05/21 Potential to Achieve Goals: Good    Frequency Min 3X/week   Barriers to discharge        Co-evaluation               AM-PAC PT "6 Clicks" Mobility  Outcome Measure Help needed turning from your back to your side while in a flat bed without using bedrails?: A Little Help needed moving from lying on your back to sitting on the side of a flat  bed without using bedrails?: A Little Help needed moving to and from a bed to a chair (including a wheelchair)?: A Lot Help needed standing up from a chair using your arms (e.g., wheelchair or bedside chair)?: A Little Help needed to walk in hospital room?: Total Help needed climbing 3-5 steps with a railing? : Total 6 Click Score: 13    End of Session   Activity Tolerance: Patient tolerated treatment well Patient left: in bed;with call bell/phone within reach;with bed alarm set Nurse Communication: Mobility status PT Visit Diagnosis: Difficulty in walking, not elsewhere classified (R26.2)    Time: [id number] PT Time Calculation (min) (ACUTE ONLY): 21 min   Charges:   PT Evaluation $PT Eval Moderate Complexity: [place], PT Acute Rehabilitation Services  Pager [phone] Office [phone]   Rexanne Mano 01/22/2021, 12:34 PM

## 2021-01-22 NOTE — Progress Notes (Signed)
Admitted to 5 west room 20 via stretcher from emergency room, pt awake but drowsy. Pt able to answer questions simply confused doesn't know his name or where he is and why he is here, or year or month. Restless trying to get oob. Md notified.

## 2021-01-22 NOTE — ED Notes (Signed)
Pt remains agitated and confused, continually attempting to get out of bed. Provider contacted and notified of the same.

## 2021-01-22 NOTE — Progress Notes (Signed)
PROGRESS NOTE    Michael Wolfe  [id number] DOB: August 29, 1931 DOA: 01/02/2021 PCP: Charolette Forward, MD   Chief Complaint  Patient presents with   Shortness of Breath   Chest Pain    Brief Narrative:   Michael Wolfe is a 87 y.o. male with medical history significant of AAA, HTN, CAD with stable angina, aortic stenosis, CKD stage II, HLD, IIDM, brought in by family member for increasing shortness of breath, chest pain/epigastric pain.   Patient is confused, unable to provide any history, most history provided by patient son over the phone.  Patient lives with his wife, with son lives 1 hour away.  Last 2 days, patient has had increasing shortness of breath along with chest/epigastric pain, has had poor oral intake for the last 2 days " anything he ate will came straight up right away".  No fever or chills, no diarrhea.  Wife also experienced similar symptoms since yesterday.  Son also reported at baseline patient has had increasing exertional shortness of breath but never complained about chest pains until 2 days ago.  Patient never had COVID vaccination.   Assessment & Plan:   Principal Problem:   COVID Active Problems:   COVID-19 virus infection     COVID-19 infection  -Patient with no hypoxia, but tenuous, with multiple comorbidities including CAD, CKD, diabetes mellitus . -With remdesivir . -Continue with short course of p.o. steroids .  AKI on CKD IIIB -From volume depletion and dehydration, hold lisinopril and continue with IV fluids. -Continue with gentle hydration -CT abdomen pelvis showed no urinary obstructions.   Exertional dyspnea -Doubt this is related to COVID, given the history of aortic stenosis, will check echocardiogram.   Metabolic encephalopathy acute -This is most likely due to delirium, versus COVID-19 -CT head/MRI significant for remote stroke, but no acute ischemic event -Continue with as needed Haldol for hospital delirium -TSH within normal  limit, B12 on the lower side at 348, will start on some supplements ACS ruled out, echo is reassuring   Elevated troponins -Demand ischemia from COVID and AKI, reassuring 35 > 36 > 41 -no regional wall motion abnormalities on 2D echo   HTN -Continue amlodipine, hold lisinopril, start as needed hydralazine.   IIDM -Hold p.o. diabetic medication, start sliding scale.   CAD -Continue Plavix and statin.   DVT prophylaxis: Heparin Code Status: Full Family Communication: tried to call both sons, able to reach wife. Disposition:   Status is: Inpatient  Remains inpatient appropriate because:        Consultants:  None    Subjective:  Patient himself is confused cant provide any complaints.  Objective: Vitals:   01/22/21 0500 01/22/21 0600 01/22/21 0811 01/22/21 1158  BP: (!) 107/59  112/71 111/73  Pulse: 79 (!) 105 (!) 103 85  Resp: (!) 32 (!) 33 19 (!) 23  Temp:   99.5 F (37.5 C) 98.7 F (37.1 C)  TempSrc:   Oral Oral  SpO2: 93% 96% 95% 93%  Weight:      Height:        Intake/Output Summary (Last 24 hours) at 01/22/2021 1357 Last data filed at 01/22/2021 1048 Gross per 24 hour  Intake 299.49 ml  Output 240 ml  Net 59.49 ml   Filed Weights   01/22/21 0035  Weight: 85.8 kg    Examination:   Awake , confused, restless, frail Symmetrical Chest wall movement, managed air entry at the bases. RRR,No Gallops,Rubs or new Murmurs, No Parasternal Heave +ve  B.Sounds, Abd Soft, No tenderness, No rebound - guarding or rigidity. No Cyanosis, Clubbing or edema, No new Rash or bruise      Data Reviewed: I have personally reviewed following labs and imaging studies  CBC: Recent Labs  Lab 01/10/2021 1349 01/22/21 0131  WBC 5.3 4.6  NEUTROABS  --  3.7  HGB 15.7 15.0  HCT 46.1 44.7  MCV 92.0 92.4  PLT 110* 98*    Basic Metabolic Panel: Recent Labs  Lab 01/17/2021 1349 01/22/21 0131  NA 139 138  K 3.7 4.0  CL 110 111  CO2 18* 17*  GLUCOSE 143* 174*   BUN 35* 32*  CREATININE 2.22* 1.82*  CALCIUM 8.8* 8.2*  MG  --  1.9  PHOS  --  2.2*    GFR: Estimated Creatinine Clearance: 29.9 mL/min (A) (by C-G formula based on SCr of 1.82 mg/dL (H)).  Liver Function Tests: Recent Labs  Lab 12/31/2020 1349 01/22/21 0131  AST 41 37  ALT 46* 40  ALKPHOS 72 71  BILITOT 1.0 1.0  PROT 6.6 6.2*  ALBUMIN 3.7 3.6    CBG: Recent Labs  Lab 01/22/21 0818 01/22/21 1156  GLUCAP 227* 183*     Recent Results (from the past 240 hour(s))  Resp Panel by RT-PCR (Flu A&B, Covid) Nasopharyngeal Swab     Status: Abnormal   Collection Time: 01/20/2021  4:50 PM   Specimen: Nasopharyngeal Swab; Nasopharyngeal(NP) swabs in vial transport medium  Result Value Ref Range Status   SARS Coronavirus 2 by RT PCR POSITIVE (A) NEGATIVE Final    Comment: (NOTE) SARS-CoV-2 target nucleic acids are DETECTED.  The SARS-CoV-2 RNA is generally detectable in upper respiratory specimens during the acute phase of infection. Positive results are indicative of the presence of the identified virus, but do not rule out bacterial infection or co-infection with other pathogens not detected by the test. Clinical correlation with patient history and other diagnostic information is necessary to determine patient infection status. The expected result is Negative.  Fact Sheet for Patients: EntrepreneurPulse.com.au  Fact Sheet for Healthcare Providers: IncredibleEmployment.be  This test is not yet approved or cleared by the [place] FDA and  has been authorized for detection and/or diagnosis of SARS-CoV-2 by FDA under an Emergency Use Authorization (EUA).  This EUA will remain in effect (meaning this test can be used) for the duration of  the COVID-19 declaration under Section 564(b)(1) of the A ct, 21 U.S.C. section 360bbb-3(b)(1), unless the authorization is terminated or revoked sooner.     Influenza A by PCR NEGATIVE NEGATIVE  Final   Influenza B by PCR NEGATIVE NEGATIVE Final    Comment: (NOTE) The Xpert Xpress SARS-CoV-2/FLU/RSV plus assay is intended as an aid in the diagnosis of influenza from Nasopharyngeal swab specimens and should not be used as a sole basis for treatment. Nasal washings and aspirates are unacceptable for Xpert Xpress SARS-CoV-2/FLU/RSV testing.  Fact Sheet for Patients: EntrepreneurPulse.com.au  Fact Sheet for Healthcare Providers: IncredibleEmployment.be  This test is not yet approved or cleared by the [place] FDA and has been authorized for detection and/or diagnosis of SARS-CoV-2 by FDA under an Emergency Use Authorization (EUA). This EUA will remain in effect (meaning this test can be used) for the duration of the COVID-19 declaration under Section 564(b)(1) of the Act, 21 U.S.C. section 360bbb-3(b)(1), unless the authorization is terminated or revoked.  Performed at [facility], [place] [address], [place], [place] [address]   Culture, blood (routine x  2)     Status: None (Preliminary result)   Collection Time: 01/22/21  5:04 AM   Specimen: BLOOD RIGHT HAND  Result Value Ref Range Status   Specimen Description BLOOD RIGHT HAND  Final   Special Requests   Final    BOTTLES DRAWN AEROBIC AND ANAEROBIC Blood Culture adequate volume   Culture   Final    NO GROWTH < 12 HOURS Performed at [facility], [address], [place], [place] [address]    Report Status PENDING  Incomplete  Culture, blood (routine x 2)     Status: None (Preliminary result)   Collection Time: 01/22/21  5:04 AM   Specimen: BLOOD RIGHT ARM  Result Value Ref Range Status   Specimen Description BLOOD RIGHT ARM  Final   Special Requests   Final    BOTTLES DRAWN AEROBIC AND ANAEROBIC Blood Culture adequate volume   Culture   Final    NO GROWTH < 12 HOURS Performed at [facility], [place] [address] [place], [place] [address]    Report Status  PENDING  Incomplete         Radiology Studies: DG Chest 2 View  Result Date: 01/04/2021 CLINICAL DATA:  Shortness of breath and chest pain EXAM: CHEST - 2 VIEW COMPARISON:  06/28/2014 FINDINGS: Stable cardiomediastinal contours. Aortic atherosclerotic calcifications. Lung volumes are low. No pleural effusion or edema. No airspace consolidation. Remote right anterior fourth rib fracture deformity. IMPRESSION: 1. No acute findings. 2. Low lung volumes. Electronically Signed   By: Kerby Moors M.D.   On: 12/25/2020 14:21   CT HEAD WO CONTRAST (5MM)  Result Date: 12/29/2020 CLINICAL DATA:  Delirium EXAM: CT HEAD WITHOUT CONTRAST TECHNIQUE: Contiguous axial images were obtained from the base of the skull through the vertex without intravenous contrast. COMPARISON:  07/09/2019 FINDINGS: Brain: Diffuse cerebral atrophy. Ventricular dilatation consistent with central atrophy. Low-attenuation changes in the deep white matter consistent with small vessel ischemia. There is focal encephalomalacia in the left posterior parietal white matter extending to the subcortical region. This is progressing since the previous study. Differential diagnosis would include infarct or mass lesion. Consider MRI for further evaluation. No abnormal extra-axial fluid collections. No mass effect or midline shift. Gray-white matter junctions are distinct. Basal cisterns are not effaced. No acute intracranial hemorrhage. Vascular: Moderate intracranial arterial vascular calcifications. Skull: Calvarium appears intact. Sinuses/Orbits: Mucosal thickening in the paranasal sinuses. No acute air-fluid levels. Mastoid air cells are clear. Other: None. IMPRESSION: 1. Increasing area of encephalomalacia in the left posterior parietal white matter. MRI correlation is suggested to exclude infarct or mass lesion. No significant mass effect is identified. 2. Chronic atrophy and small vessel ischemic changes. Electronically Signed   By: Lucienne Capers M.D.   On: 01/16/2021 20:24   CT CERVICAL SPINE WO CONTRAST  Result Date: 01/22/2021 CLINICAL DATA:  87 year old male with history of neck pain after a fall. EXAM: CT CERVICAL SPINE WITHOUT CONTRAST TECHNIQUE: Multidetector CT imaging of the cervical spine was performed without intravenous contrast. Multiplanar CT image reconstructions were also generated. COMPARISON:  No priors. FINDINGS: Alignment: Normal. Skull base and vertebrae: No acute fracture. No primary bone lesion or focal pathologic process. Soft tissues and spinal canal: No prevertebral fluid or swelling. No visible canal hematoma. Disc levels: Multilevel degenerative disc disease, most pronounced at C5-C6 and C6-C7. Mild multilevel facet arthropathy. Upper chest: See report for contemporaneously obtained CT the chest, abdomen and pelvis dated 01/06/2021 for full description of  findings in the upper chest. Other: Old healed fracture of the medial aspect of the left clavicle. IMPRESSION: 1. No evidence of significant acute traumatic injury to the cervical spine. 2. Multilevel degenerative disc disease and cervical spondylosis, as above. Electronically Signed   By: Vinnie Langton M.D.   On: 01/22/2021 06:25   MR BRAIN WO CONTRAST  Result Date: 01/22/2021 CLINICAL DATA:  Neuro deficit with acute stroke suspected. Possible stroke or malignancy by head CT EXAM: MRI HEAD WITHOUT CONTRAST TECHNIQUE: Multiplanar, multiecho pulse sequences of the brain and surrounding structures were obtained without intravenous contrast. COMPARISON:  Head CT from yesterday FINDINGS: Truncated study due to respiratory condition. No acute infarct, acute hemorrhage, hydrocephalus, or swelling. The left parietal abnormality has a cortical appearance consistent with encephalomalacia from old infarction. There is a notable degree of adjacent white matter T2 hyperintensity, but no associated swelling. With the benefit of this study, a July 09, 2019 head CT shows  acute cortical infarction in the same area. IMPRESSION: Truncated study but still beneficial in characterizing the left parietal abnormality as a remote infarct. Electronically Signed   By: Jorje Guild M.D.   On: 01/22/2021 07:55   ECHOCARDIOGRAM COMPLETE  Result Date: 01/22/2021    ECHOCARDIOGRAM REPORT   Patient Name:   KOUTA ANTCZAK Date of Exam: 01/22/2021 Medical Rec #:  [id number]       Height:       67.0 in Accession #:    [id number]      Weight:       189.2 lb Date of Birth:  Dec 01, 1931      BSA:          1.975 m Patient Age:    59 years        BP:           112/71 mmHg Patient Gender: M               HR:           79 bpm. Exam Location:  Inpatient Procedure: 2D Echo, Cardiac Doppler, Color Doppler and Intracardiac            Opacification Agent Indications:    Aortic stenosis I35.0  History:        Patient has prior history of Echocardiogram examinations, most                 recent 07/04/2015. Signs/Symptoms:Shortness of Breath; Risk                 Factors:Hypertension, Diabetes and Dyslipidemia. COVID. AAA.                 Chronic kidney disease. GERD.                 Aortic Valve: 26 mm Sapien prosthetic, stented (TAVR) valve is                 present in the aortic position. Procedure Date: 07/23/2014.  Sonographer:    Darlina Sicilian RDCS Referring Phys: [id number] [place]  1. Left ventricular ejection fraction, by estimation, is 60 to 65%. Left ventricular ejection fraction by PLAX is 61 %. The left ventricle has normal function. The left ventricle has no regional wall motion abnormalities. There is mild concentric left ventricular hypertrophy. Indeterminate diastolic filling due to E-A fusion.  2. Right ventricular systolic function is normal. The right ventricular size is normal.  3. The mitral valve is normal in structure. No  evidence of mitral valve regurgitation. No evidence of mitral stenosis.  4. Prosthetic aortic valve mean gradient unchanged since 2017. The aortic valve  has been repaired/replaced. Aortic valve regurgitation is not visualized. There is a 26 mm Sapien prosthetic (TAVR) valve present in the aortic position. Procedure Date: 07/23/2014. Echo findings are consistent with normal structure and function of the aortic valve prosthesis. Aortic valve area, by VTI measures 1.22 cm. Aortic valve mean gradient measures 17.0 mmHg. Aortic valve Vmax measures 2.71 m/s.  5. The inferior vena cava is normal in size with greater than 50% respiratory variability, suggesting right atrial pressure of 3 mmHg. FINDINGS  Left Ventricle: Left ventricular ejection fraction, by estimation, is 60 to 65%. Left ventricular ejection fraction by PLAX is 61 %. The left ventricle has normal function. The left ventricle has no regional wall motion abnormalities. Definity contrast agent was given IV to delineate the left ventricular endocardial borders. The left ventricular internal cavity size was normal in size. There is mild concentric left ventricular hypertrophy. Indeterminate diastolic filling due to E-A fusion. Right Ventricle: The right ventricular size is normal. No increase in right ventricular wall thickness. Right ventricular systolic function is normal. Left Atrium: Left atrial size was normal in size. Right Atrium: Right atrial size was normal in size. Pericardium: There is no evidence of pericardial effusion. Mitral Valve: The mitral valve is normal in structure. No evidence of mitral valve regurgitation. No evidence of mitral valve stenosis. Tricuspid Valve: The tricuspid valve is normal in structure. Tricuspid valve regurgitation is trivial. No evidence of tricuspid stenosis. Aortic Valve: Prosthetic aortic valve mean gradient unchanged since 2017. The aortic valve has been repaired/replaced. Aortic valve regurgitation is not visualized. Aortic valve mean gradient measures 17.0 mmHg. Aortic valve peak gradient measures 29.4 mmHg. Aortic valve area, by VTI measures 1.22 cm. There is a  26 mm Sapien prosthetic, stented (TAVR) valve present in the aortic position. Procedure Date: 07/23/2014. Echo findings are consistent with normal structure and function of the aortic valve prosthesis. Pulmonic Valve: The pulmonic valve was normal in structure. Pulmonic valve regurgitation is trivial. No evidence of pulmonic stenosis. Aorta: The aortic root is normal in size and structure. Venous: The inferior vena cava is normal in size with greater than 50% respiratory variability, suggesting right atrial pressure of 3 mmHg. IAS/Shunts: No atrial level shunt detected by color flow Doppler.  LEFT VENTRICLE PLAX 2D LV EF:         Left ventricular ejection fraction by PLAX is 61 %. LVIDd:         4.60 cm LVIDs:         3.10 cm LV PW:         1.20 cm LV IVS:        1.12 cm LVOT diam:     2.10 cm LV SV:         54 LV SV Index:   28 LVOT Area:     3.46 cm  LEFT ATRIUM             Index LA Vol (A2C):   42.9 ml 21.72 ml/m LA Vol (A4C):   55.8 ml 28.25 ml/m LA Biplane Vol: 49.6 ml 25.12 ml/m  AORTIC VALVE AV Area (Vmax):    1.42 cm AV Area (Vmean):   1.36 cm AV Area (VTI):     1.22 cm AV Vmax:           271.00 cm/s AV Vmean:  173.000 cm/s AV VTI:            0.446 m AV Peak Grad:      29.4 mmHg AV Mean Grad:      17.0 mmHg LVOT Vmax:         111.00 cm/s LVOT Vmean:        68.100 cm/s LVOT VTI:          0.157 m LVOT/AV VTI ratio: 0.35  AORTA Ao Asc diam: 3.00 cm TRICUSPID VALVE TR Peak grad:   9.0 mmHg TR Vmax:        150.00 cm/s  SHUNTS Systemic VTI:  0.16 m Systemic Diam: 2.10 cm Skeet Latch MD Electronically signed by Skeet Latch MD Signature Date/Time: 01/22/2021/1:45:14 PM    Final    CT CHEST ABDOMEN PELVIS WO CONTRAST  Result Date: 01/02/2021 CLINICAL DATA:  Chronic cough, shortness of breath EXAM: CT CHEST, ABDOMEN AND PELVIS WITHOUT CONTRAST TECHNIQUE: Multidetector CT imaging of the chest, abdomen and pelvis was performed following the standard protocol without IV contrast. COMPARISON:   07/09/2019 FINDINGS: CT CHEST FINDINGS Cardiovascular: Extensive coronary artery calcifications are seen. There is prosthetic stent/valve in the aortic valve. There is mild ectasia of proximal descending thoracic aorta. Mediastinum/Nodes: No significant lymphadenopathy seen. Lungs/Pleura: There are small patchy ground-glass infiltrates in the periphery of both lungs, more so on the right side. There is no significant pleural effusion or pneumothorax. Musculoskeletal: No significant abnormality is seen. CT ABDOMEN PELVIS FINDINGS Hepatobiliary: There are multiple calcified gallbladder stones. Pancreas: No focal abnormality is seen. Spleen: Unremarkable. Adrenals/Urinary Tract: Adrenals are unremarkable. There is no hydronephrosis. There are few small bilateral renal stones each measuring less than 2 mm in size. There are possible small parapelvic cysts in both kidneys. Ureters are not dilated. Urinary bladder is unremarkable. Stomach/Bowel: Stomach is unremarkable. Small bowel loops are not dilated. Appendix is not dilated. There is no significant wall thickening in colon. Scattered diverticula are seen in colon without signs of focal diverticulitis. Vascular/Lymphatic: There is previous endovascular stent repair of aortic aneurysm. Native aneurysm measures 4.9 x 5 cm with no significant change since 07/09/2019. There is no evidence of retroperitoneal hematoma. Reproductive: Unremarkable. Other: There is no ascites or pneumoperitoneum. Bilateral inguinal hernias containing fat are seen. Musculoskeletal: Decrease in height of bodies of L2 and L4 vertebrae has not changed. IMPRESSION: There are small scattered ground-glass infiltrates in the periphery of both lungs, more so on the right side. Differential diagnostic possibilities would include interstitial pneumonitis or scarring. There is no focal pulmonary consolidation. There is no significant pleural effusion or pneumothorax. Extensive coronary artery  calcifications are seen. Prosthetic valve/stent is seen in the aortic root. There is no evidence of intestinal obstruction or pneumoperitoneum. There is no hydronephrosis. Appendix is not dilated. There is previous endovascular stent repair of infrarenal aortic aneurysm. Overall size of the aneurysm has not changed significantly. There is no retroperitoneal hematoma. Gallbladder stones.  Diverticulosis of colon.  Renal stones. Other findings as described in the body of the report. Electronically Signed   By: Elmer Picker M.D.   On: 01/12/2021 18:24        Scheduled Meds:  amLODipine  5 mg Oral Daily   clopidogrel  75 mg Oral Daily   dexamethasone  6 mg Oral Q24H   heparin  5,000 Units Subcutaneous Q8H   insulin aspart  0-9 Units Subcutaneous TID WC   Ipratropium-Albuterol  1 puff Inhalation Q6H   rosuvastatin  20 mg Oral Daily  Continuous Infusions:  remdesivir 100 mg in NS 100 mL 100 mg (01/22/21 0829)     LOS: 0 days       Huey Bienenstock, MD [facility]   To contact the attending provider between 7A-7P or the covering provider during after hours 7P-7A, please log into the web site [email or web address] and access using universal  password for that web site. If you do not have the password, please call the hospital operator.  01/22/2021, 1:57 PM   Patient ID: DEMOND SHALLENBERGER, male   DOB: Jan 31, 1931, 87 y.o.   MRN: [id number]

## 2021-01-22 NOTE — ED Notes (Signed)
Pt noted to continually attempt to get out of bed. Redirected by ED staff numerous times but efforts are short lived. Night coverage provider contacted and notified of same. Plan of Care discussed and new orders given to help with agitation and confusion as well as allow pt tolerate MRI. This nurse will administer ordered medication and observe for positive or negative effects. Will notify provider of same.

## 2021-01-22 NOTE — Progress Notes (Signed)
Pt awake continues to attempt to get oob and sliding to foot of bed as much as he can. Restless and agitated , and confused x 3

## 2021-01-22 NOTE — Progress Notes (Signed)
HOSPITAL MEDICINE OVERNIGHT EVENT NOTE    Notified by nursing that patient has continued to exhibit severe agitation associated with what is described as worsening tachypnea.  I have personally evaluated the patient at the bedside.  While patient was initially tachypneic and not participating in conversation I believe part of this is due to presbycusis.  Once patient was repositioned in bed and we ensured that the patient can hear [place] he has been responsive to most questions and following most commands.  Patient's agitation and tachypnea essentially resolved.  Patient is oriented x2 and only did not know the year.  Patient states that he recently fell and as a result of the neck was hurting him.  On exam, mucous membranes are dry.  Neck is painful on passive and active range of motion.  Lungs are clear and heart sounds are regular.  Abdomen is soft and nontender.  Patient is moving all 4 extremities spontaneously.  Despite patient's complaints of neck pain I do not believe that the patient has meningitis.  Patient has no fever or leukocytosis.  Patient does not exhibit any photophobia on exam.  Due to patient's reported neck pain and fall obtaining CT imaging to evaluate for any bony injury.  Repeat EKG obtained to reassess QTc after administration of Haldol earlier in the evening.   As discussed in my previous note patient will eventually need to undergo MRI imaging to evaluate abnormalities seen on CT.  Marinda Elk  MD [facility]

## 2021-01-23 LAB — COMPREHENSIVE METABOLIC PANEL
ALT: 40 U/L (ref 0–44)
AST: 53 U/L — ABNORMAL HIGH (ref 15–41)
Albumin: 3.4 g/dL — ABNORMAL LOW (ref 3.5–5.0)
Alkaline Phosphatase: 70 U/L (ref 38–126)
Anion gap: 10 (ref 5–15)
BUN: 33 mg/dL — ABNORMAL HIGH (ref 8–23)
CO2: 16 mmol/L — ABNORMAL LOW (ref 22–32)
Calcium: 8.4 mg/dL — ABNORMAL LOW (ref 8.9–10.3)
Chloride: 114 mmol/L — ABNORMAL HIGH (ref 98–111)
Creatinine, Ser: 1.48 mg/dL — ABNORMAL HIGH (ref 0.61–1.24)
GFR, Estimated: 46 mL/min — ABNORMAL LOW (ref >60)
Glucose, Bld: 198 mg/dL — ABNORMAL HIGH (ref 70–99)
Potassium: 5.5 mmol/L — ABNORMAL HIGH (ref 3.5–5.1)
Sodium: 140 mmol/L (ref 135–145)
Total Bilirubin: 1.7 mg/dL — ABNORMAL HIGH (ref 0.3–1.2)
Total Protein: 6.4 g/dL — ABNORMAL LOW (ref 6.5–8.1)

## 2021-01-23 LAB — GLUCOSE, CAPILLARY
Glucose-Capillary: 238 mg/dL — ABNORMAL HIGH (ref 70–99)
Glucose-Capillary: 246 mg/dL — ABNORMAL HIGH (ref 70–99)
Glucose-Capillary: 261 mg/dL — ABNORMAL HIGH (ref 70–99)
Glucose-Capillary: 229 mg/dL — ABNORMAL HIGH (ref 70–99)

## 2021-01-23 LAB — CBC WITH DIFFERENTIAL/PLATELET
Abs Immature Granulocytes: 0.02 10*3/uL (ref 0.00–0.07)
Basophils Absolute: 0 10*3/uL (ref 0.0–0.1)
Basophils Relative: 0 %
Eosinophils Absolute: 0 10*3/uL (ref 0.0–0.5)
Eosinophils Relative: 0 %
HCT: 47.8 % (ref 39.0–52.0)
Hemoglobin: 16.6 g/dL (ref 13.0–17.0)
Immature Granulocytes: 0 %
Lymphocytes Relative: 9 %
Lymphs Abs: 0.7 10*3/uL (ref 0.7–4.0)
MCH: 32.2 pg (ref 26.0–34.0)
MCHC: 34.7 g/dL (ref 30.0–36.0)
MCV: 92.6 fL (ref 80.0–100.0)
Monocytes Absolute: 0.5 10*3/uL (ref 0.1–1.0)
Monocytes Relative: 6 %
Neutro Abs: 6.9 10*3/uL (ref 1.7–7.7)
Neutrophils Relative %: 85 %
Platelets: 120 10*3/uL — ABNORMAL LOW (ref 150–400)
RBC: 5.16 MIL/uL (ref 4.22–5.81)
RDW: 13.7 % (ref 11.5–15.5)
WBC: 8.1 10*3/uL (ref 4.0–10.5)
nRBC: 0 % (ref 0.0–0.2)

## 2021-01-23 LAB — URINE CULTURE

## 2021-01-23 LAB — PHOSPHORUS: Phosphorus: 2.6 mg/dL (ref 2.5–4.6)

## 2021-01-23 LAB — MAGNESIUM: Magnesium: 2 mg/dL (ref 1.7–2.4)

## 2021-01-23 MED ORDER — IPRATROPIUM-ALBUTEROL 20-100 MCG/ACT IN AERS
1.0000 | INHALATION_SPRAY | Freq: Four times a day (QID) | RESPIRATORY_TRACT | Status: DC | PRN
  Filled 2021-01-23: qty 4

## 2021-01-23 MED ORDER — SODIUM BICARBONATE 650 MG PO TABS
1300.0000 mg | ORAL_TABLET | Freq: Two times a day (BID) | ORAL | Status: DC
  Administered 2021-01-23 – 2021-01-24 (×3): 1300 mg via ORAL
  Filled 2021-01-23 (×4): qty 2

## 2021-01-23 MED ORDER — LORAZEPAM 2 MG/ML IJ SOLN
0.5000 mg | Freq: Once | INTRAMUSCULAR | Status: AC
  Administered 2021-01-23: 03:00:00 0.5 mg via INTRAVENOUS
  Filled 2021-01-23: qty 1

## 2021-01-23 MED ORDER — SODIUM CHLORIDE 0.9 % IV SOLN: INTRAVENOUS | Status: AC

## 2021-01-23 MED ORDER — HYDRALAZINE HCL 20 MG/ML IJ SOLN
10.0000 mg | Freq: Four times a day (QID) | INTRAMUSCULAR | Status: DC | PRN
  Administered 2021-01-24 – 2021-01-26 (×2): 10 mg via INTRAVENOUS
  Filled 2021-01-23 (×2): qty 1

## 2021-01-23 NOTE — Progress Notes (Signed)
°   01/23/21 1655  Assess: MEWS Score  BP 130/81  Pulse Rate (!) 112  ECG Heart Rate (!) 112  Resp (!) 25  SpO2 97 %  Assess: MEWS Score  MEWS Temp 0  MEWS Systolic 0  MEWS Pulse 2  MEWS RR 1  MEWS LOC 0  MEWS Score 3  MEWS Score Color Yellow  Assess: if the MEWS score is Yellow or Red  Were vital signs taken at a resting state? Yes  Focused Assessment No change from prior assessment  Early Detection of Sepsis Score *See Row Information* Medium  MEWS guidelines implemented *See Row Information* Yes  Treat  MEWS Interventions Escalated (See documentation below)  Pain Scale 0-10  Pain Score 0  Take Vital Signs  Increase Vital Sign Frequency  Yellow: Q 2hr X 2 then Q 4hr X 2, if remains yellow, continue Q 4hrs  Escalate  MEWS: Escalate Yellow: discuss with charge nurse/RN and consider discussing with provider and RRT  Notify: Charge Nurse/RN  Name of Charge Nurse/RN Notified John B  Date Charge Nurse/RN Notified 01/23/21  Time Charge Nurse/RN Notified 1808  Document  Patient Outcome Stabilized after interventions  Progress note created (see row info) Yes

## 2021-01-23 NOTE — Progress Notes (Signed)
PROGRESS NOTE    KERRION KEMPPAINEN  ZOX:[id number] DOB: May 17, 1931 DOA: 01/10/2021 PCP: Rinaldo Cloud, MD   Chief Complaint  Patient presents with   Shortness of Breath   Chest Pain    Brief Narrative:   DUAYNE BRIDEAU is a 87 y.o. male with medical history significant of AAA, HTN, CAD with stable angina, aortic stenosis, CKD stage II, HLD, IIDM, brought in by family member for increasing shortness of breath, chest pain/epigastric pain.   Patient is confused, unable to provide any history, most history provided by patient son over the phone.  Patient lives with his wife, with son lives 1 hour away.  Last 2 days, patient has had increasing shortness of breath along with chest/epigastric pain, has had poor oral intake for the last 2 days " anything he ate will came straight up right away".  No fever or chills, no diarrhea.  Wife also experienced similar symptoms since yesterday.  Son also reported at baseline patient has had increasing exertional shortness of breath but never complained about chest pains until 2 days ago.  Patient never had COVID vaccination.   Assessment & Plan:   Principal Problem:   COVID Active Problems:   COVID-19 virus infection     COVID-19 infection  -Patient with no hypoxia, but tenuous, with multiple comorbidities including CAD, CKD, diabetes mellitus . -continue With remdesivir . -Continue with short course of p.o. steroids .  AKI on CKD IIIB -From volume depletion and dehydration, hold lisinopril and continue with IV fluids.  Creatinine improving it is 1.4 today. -Continue with gentle hydration -CT abdomen pelvis showed no urinary obstructions.     Metabolic encephalopathy acute -This is most likely due to delirium, versus COVID-19 -CT head/MRI significant for remote stroke, but no acute ischemic event -TSH within normal limit, B12 on the lower side at 348, will start on some supplements ACS ruled out, echo is reassuring -Patient remains with  significant delirium during hospital stay complicating patient care, he cannot receive any antipsychotic in the setting of prolonged QTC, will use benzos judiciously. -discussed with wife and son, they will try to provide as much family members to visit during daytime to help with his delirium, will provide one-to-one sitter as trying to avoid chemical sedation.   Elevated troponins -Demand ischemia from COVID and AKI, reassuring 35 > 36 > 41 -no regional wall motion abnormalities on 2D echo   HTN -Continue amlodipine, hold lisinopril, start as needed hydralazine.   IIDM -Hold p.o. diabetic medication, start sliding scale.   CAD -Continue Plavix and statin.   DVT prophylaxis: Heparin Code Status: Full Family Communication: Discussed with son at bedside today Disposition:   Status is: Inpatient  Remains inpatient appropriate because:        Consultants:  None    Subjective:  She is with significant delirium/agitation overnight required as needed Haldol/benzos, and on straight this morning, this has been discontinued after sitter has been provided  Objective: Vitals:   01/23/21 0753 01/23/21 0804 01/23/21 1158 01/23/21 1300  BP: (!) 142/93 (!) 153/89 (!) 141/80 127/79  Pulse: (!) 105 (!) 109 (!) 113 100  Resp: 20 20 (!) 25 20  Temp: 97.8 F (36.6 C) 97.7 F (36.5 C) 97.9 F (36.6 C)   TempSrc: Oral Oral Oral   SpO2: 96% 96% 96% 95%  Weight:      Height:        Intake/Output Summary (Last 24 hours) at 01/23/2021 1554 Last data filed at 01/23/2021  0500 Gross per 24 hour  Intake --  Output 1750 ml  Net -1750 ml   Filed Weights   01/22/21 0035  Weight: 85.8 kg    Examination:  Sleepy, but open eyes to verbal stimulation, significantly confused, frail and deconditioned Symmetrical Chest wall movement, Good air movement bilaterally, CTAB RRR,No Gallops,Rubs or new Murmurs, No Parasternal Heave +ve B.Sounds, Abd Soft, No tenderness, No rebound - guarding  or rigidity. No Cyanosis, Clubbing or edema, No new Rash or bruise       Data Reviewed: I have personally reviewed following labs and imaging studies  CBC: Recent Labs  Lab 02/20/21 1349 01/22/21 0131 01/23/21 0106  WBC 5.3 4.6 8.1  NEUTROABS  --  3.7 6.9  HGB 15.7 15.0 16.6  HCT 46.1 44.7 47.8  MCV 92.0 92.4 92.6  PLT 110* 98* 120*    Basic Metabolic Panel: Recent Labs  Lab Feb 20, 2021 1349 01/22/21 0131 01/23/21 0106  NA 139 138 140  K 3.7 4.0 5.5*  CL 110 111 114*  CO2 18* 17* 16*  GLUCOSE 143* 174* 198*  BUN 35* 32* 33*  CREATININE 2.22* 1.82* 1.48*  CALCIUM 8.8* 8.2* 8.4*  MG  --  1.9 2.0  PHOS  --  2.2* 2.6    GFR: Estimated Creatinine Clearance: 36.8 mL/min (A) (by C-G formula based on SCr of 1.48 mg/dL (H)).  Liver Function Tests: Recent Labs  Lab February 20, 2021 1349 01/22/21 0131 01/23/21 0106  AST 41 37 53*  ALT 46* 40 40  ALKPHOS 72 71 70  BILITOT 1.0 1.0 1.7*  PROT 6.6 6.2* 6.4*  ALBUMIN 3.7 3.6 3.4*    CBG: Recent Labs  Lab 01/22/21 1156 01/22/21 1610 01/22/21 1950 01/23/21 0800 01/23/21 1215  GLUCAP 183* 187* 185* 238* 246*     Recent Results (from the past 240 hour(s))  Resp Panel by RT-PCR (Flu A&B, Covid) Nasopharyngeal Swab     Status: Abnormal   Collection Time: 02-20-2021  4:50 PM   Specimen: Nasopharyngeal Swab; Nasopharyngeal(NP) swabs in vial transport medium  Result Value Ref Range Status   SARS Coronavirus 2 by RT PCR POSITIVE (A) NEGATIVE Final    Comment: (NOTE) SARS-CoV-2 target nucleic acids are DETECTED.  The SARS-CoV-2 RNA is generally detectable in upper respiratory specimens during the acute phase of infection. Positive results are indicative of the presence of the identified virus, but do not rule out bacterial infection or co-infection with other pathogens not detected by the test. Clinical correlation with patient history and other diagnostic information is necessary to determine patient infection status. The  expected result is Negative.  Fact Sheet for Patients: BloggerCourse.com  Fact Sheet for Healthcare Providers: SeriousBroker.it  This test is not yet approved or cleared by the [place] FDA and  has been authorized for detection and/or diagnosis of SARS-CoV-2 by FDA under an Emergency Use Authorization (EUA).  This EUA will remain in effect (meaning this test can be used) for the duration of  the COVID-19 declaration under Section 564(b)(1) of the A ct, 21 U.S.C. section 360bbb-3(b)(1), unless the authorization is terminated or revoked sooner.     Influenza A by PCR NEGATIVE NEGATIVE Final   Influenza B by PCR NEGATIVE NEGATIVE Final    Comment: (NOTE) The Xpert Xpress SARS-CoV-2/FLU/RSV plus assay is intended as an aid in the diagnosis of influenza from Nasopharyngeal swab specimens and should not be used as a sole basis for treatment. Nasal washings and aspirates are unacceptable for Xpert Xpress SARS-CoV-2/FLU/RSV testing.  Fact Sheet for Patients: BloggerCourse.com  Fact Sheet for Healthcare Providers: SeriousBroker.it  This test is not yet approved or cleared by the [place] FDA and has been authorized for detection and/or diagnosis of SARS-CoV-2 by FDA under an Emergency Use Authorization (EUA). This EUA will remain in effect (meaning this test can be used) for the duration of the COVID-19 declaration under Section 564(b)(1) of the Act, 21 U.S.C. section 360bbb-3(b)(1), unless the authorization is terminated or revoked.  Performed at [facility], [address], [place], [place] [address]   Culture, blood (routine x 2)     Status: None (Preliminary result)   Collection Time: 01/22/21  5:04 AM   Specimen: BLOOD RIGHT HAND  Result Value Ref Range Status   Specimen Description BLOOD RIGHT HAND  Final   Special Requests   Final    BOTTLES DRAWN AEROBIC  AND ANAEROBIC Blood Culture adequate volume   Culture   Final    NO GROWTH 1 DAY Performed at [facility], [address] [place], [place] [address]    Report Status PENDING  Incomplete  Culture, blood (routine x 2)     Status: None (Preliminary result)   Collection Time: 01/22/21  5:04 AM   Specimen: BLOOD RIGHT ARM  Result Value Ref Range Status   Specimen Description BLOOD RIGHT ARM  Final   Special Requests   Final    BOTTLES DRAWN AEROBIC AND ANAEROBIC Blood Culture adequate volume   Culture   Final    NO GROWTH 1 DAY Performed at [facility], [address] [place], [place] [address]    Report Status PENDING  Incomplete  Urine Culture     Status: Abnormal   Collection Time: 01/22/21  5:21 AM   Specimen: Urine, Clean Catch  Result Value Ref Range Status   Specimen Description URINE, CLEAN CATCH  Final   Special Requests   Final    NONE Performed at [facility], [address] [place], [place] [address]    Culture MULTIPLE SPECIES PRESENT, SUGGEST RECOLLECTION (A)  Final   Report Status 01/23/2021 FINAL  Final         Radiology Studies: CT HEAD WO CONTRAST ( )  Result Date: 02-03-2021 CLINICAL DATA:  Delirium EXAM: CT HEAD WITHOUT CONTRAST TECHNIQUE: Contiguous axial images were obtained from the base of the skull through the vertex without intravenous contrast. COMPARISON:  07/09/2019 FINDINGS: Brain: Diffuse cerebral atrophy. Ventricular dilatation consistent with central atrophy. Low-attenuation changes in the deep white matter consistent with small vessel ischemia. There is focal encephalomalacia in the left posterior parietal white matter extending to the subcortical region. This is progressing since the previous study. Differential diagnosis would include infarct or mass lesion. Consider MRI for further evaluation. No abnormal extra-axial fluid collections. No mass effect or midline shift. Gray-white matter junctions are distinct. Basal  cisterns are not effaced. No acute intracranial hemorrhage. Vascular: Moderate intracranial arterial vascular calcifications. Skull: Calvarium appears intact. Sinuses/Orbits: Mucosal thickening in the paranasal sinuses. No acute air-fluid levels. Mastoid air cells are clear. Other: None. IMPRESSION: 1. Increasing area of encephalomalacia in the left posterior parietal white matter. MRI correlation is suggested to exclude infarct or mass lesion. No significant mass effect is identified. 2. Chronic atrophy and small vessel ischemic changes. Electronically Signed   By: Burman Nieves M.D.   On: 2021/02/03 20:24   CT CERVICAL SPINE WO CONTRAST  Result Date: 01/22/2021 CLINICAL DATA:  87 year old male with  history of neck pain after a fall. EXAM: CT CERVICAL SPINE WITHOUT CONTRAST TECHNIQUE: Multidetector CT imaging of the cervical spine was performed without intravenous contrast. Multiplanar CT image reconstructions were also generated. COMPARISON:  No priors. FINDINGS: Alignment: Normal. Skull base and vertebrae: No acute fracture. No primary bone lesion or focal pathologic process. Soft tissues and spinal canal: No prevertebral fluid or swelling. No visible canal hematoma. Disc levels: Multilevel degenerative disc disease, most pronounced at C5-C6 and C6-C7. Mild multilevel facet arthropathy. Upper chest: See report for contemporaneously obtained CT the chest, abdomen and pelvis dated 02-09-2021 for full description of findings in the upper chest. Other: Old healed fracture of the medial aspect of the left clavicle. IMPRESSION: 1. No evidence of significant acute traumatic injury to the cervical spine. 2. Multilevel degenerative disc disease and cervical spondylosis, as above. Electronically Signed   By: Trudie Reed M.D.   On: 01/22/2021 06:25   MR BRAIN WO CONTRAST  Result Date: 01/22/2021 CLINICAL DATA:  Neuro deficit with acute stroke suspected. Possible stroke or malignancy by head CT EXAM: MRI  HEAD WITHOUT CONTRAST TECHNIQUE: Multiplanar, multiecho pulse sequences of the brain and surrounding structures were obtained without intravenous contrast. COMPARISON:  Head CT from yesterday FINDINGS: Truncated study due to respiratory condition. No acute infarct, acute hemorrhage, hydrocephalus, or swelling. The left parietal abnormality has a cortical appearance consistent with encephalomalacia from old infarction. There is a notable degree of adjacent white matter T2 hyperintensity, but no associated swelling. With the benefit of this study, a July 09, 2019 head CT shows acute cortical infarction in the same area. IMPRESSION: Truncated study but still beneficial in characterizing the left parietal abnormality as a remote infarct. Electronically Signed   By: Tiburcio Pea M.D.   On: 01/22/2021 07:55   ECHOCARDIOGRAM COMPLETE  Result Date: 01/22/2021    ECHOCARDIOGRAM REPORT   Patient Name:   ELEFTHERIOS DUDENHOEFFER Date of Exam: 01/22/2021 Medical Rec #:  [id number]       Height:       67.0 in Accession #:    [id number]      Weight:       189.2 lb Date of Birth:  December 17, 1931      BSA:          1.975 m Patient Age:    89 years        BP:           112/71 mmHg Patient Gender: M               HR:           79 bpm. Exam Location:  Inpatient Procedure: 2D Echo, Cardiac Doppler, Color Doppler and Intracardiac            Opacification Agent Indications:    Aortic stenosis I35.0  History:        Patient has prior history of Echocardiogram examinations, most                 recent 07/04/2015. Signs/Symptoms:Shortness of Breath; Risk                 Factors:Hypertension, Diabetes and Dyslipidemia. COVID. AAA.                 Chronic kidney disease. GERD.                 Aortic Valve: 26 mm Sapien prosthetic, stented (TAVR) valve is  present in the aortic position. Procedure Date: 07/23/2014.  Sonographer:    Leta Jungling RDCS Referring Phys: [id number] [place] General IMPRESSIONS  1. Left ventricular ejection  fraction, by estimation, is 60 to 65%. Left ventricular ejection fraction by PLAX is 61 %. The left ventricle has normal function. The left ventricle has no regional wall motion abnormalities. There is mild concentric left ventricular hypertrophy. Indeterminate diastolic filling due to E-A fusion.  2. Right ventricular systolic function is normal. The right ventricular size is normal.  3. The mitral valve is normal in structure. No evidence of mitral valve regurgitation. No evidence of mitral stenosis.  4. Prosthetic aortic valve mean gradient unchanged since 2017. The aortic valve has been repaired/replaced. Aortic valve regurgitation is not visualized. There is a 26 mm Sapien prosthetic (TAVR) valve present in the aortic position. Procedure Date: 07/23/2014. Echo findings are consistent with normal structure and function of the aortic valve prosthesis. Aortic valve area, by VTI measures 1.22 cm. Aortic valve mean gradient measures 17.0 mmHg. Aortic valve Vmax measures 2.71 m/s.  5. The inferior vena cava is normal in size with greater than 50% respiratory variability, suggesting right atrial pressure of 3 mmHg. FINDINGS  Left Ventricle: Left ventricular ejection fraction, by estimation, is 60 to 65%. Left ventricular ejection fraction by PLAX is 61 %. The left ventricle has normal function. The left ventricle has no regional wall motion abnormalities. Definity contrast agent was given IV to delineate the left ventricular endocardial borders. The left ventricular internal cavity size was normal in size. There is mild concentric left ventricular hypertrophy. Indeterminate diastolic filling due to E-A fusion. Right Ventricle: The right ventricular size is normal. No increase in right ventricular wall thickness. Right ventricular systolic function is normal. Left Atrium: Left atrial size was normal in size. Right Atrium: Right atrial size was normal in size. Pericardium: There is no evidence of pericardial effusion.  Mitral Valve: The mitral valve is normal in structure. No evidence of mitral valve regurgitation. No evidence of mitral valve stenosis. Tricuspid Valve: The tricuspid valve is normal in structure. Tricuspid valve regurgitation is trivial. No evidence of tricuspid stenosis. Aortic Valve: Prosthetic aortic valve mean gradient unchanged since 2017. The aortic valve has been repaired/replaced. Aortic valve regurgitation is not visualized. Aortic valve mean gradient measures 17.0 mmHg. Aortic valve peak gradient measures 29.4 mmHg. Aortic valve area, by VTI measures 1.22 cm. There is a 26 mm Sapien prosthetic, stented (TAVR) valve present in the aortic position. Procedure Date: 07/23/2014. Echo findings are consistent with normal structure and function of the aortic valve prosthesis. Pulmonic Valve: The pulmonic valve was normal in structure. Pulmonic valve regurgitation is trivial. No evidence of pulmonic stenosis. Aorta: The aortic root is normal in size and structure. Venous: The inferior vena cava is normal in size with greater than 50% respiratory variability, suggesting right atrial pressure of 3 mmHg. IAS/Shunts: No atrial level shunt detected by color flow Doppler.  LEFT VENTRICLE PLAX 2D LV EF:         Left ventricular ejection fraction by PLAX is 61 %. LVIDd:         4.60 cm LVIDs:         3.10 cm LV PW:         1.20 cm LV IVS:        1.12 cm LVOT diam:     2.10 cm LV SV:         54 LV SV Index:   28 LVOT  Area:     3.46 cm  LEFT ATRIUM             Index LA Vol (A2C):   42.9 ml 21.72 ml/m LA Vol (A4C):   55.8 ml 28.25 ml/m LA Biplane Vol: 49.6 ml 25.12 ml/m  AORTIC VALVE AV Area (Vmax):    1.42 cm AV Area (Vmean):   1.36 cm AV Area (VTI):     1.22 cm AV Vmax:           271.00 cm/s AV Vmean:          173.000 cm/s AV VTI:            0.446 m AV Peak Grad:      29.4 mmHg AV Mean Grad:      17.0 mmHg LVOT Vmax:         111.00 cm/s LVOT Vmean:        68.100 cm/s LVOT VTI:          0.157 m LVOT/AV VTI ratio:  0.35  AORTA Ao Asc diam: 3.00 cm TRICUSPID VALVE TR Peak grad:   9.0 mmHg TR Vmax:        150.00 cm/s  SHUNTS Systemic VTI:  0.16 m Systemic Diam: 2.10 cm Chilton Si MD Electronically signed by Chilton Si MD Signature Date/Time: 01/22/2021/1:45:14 PM    Final    CT CHEST ABDOMEN PELVIS WO CONTRAST  Result Date: 01/23/2021 CLINICAL DATA:  Chronic cough, shortness of breath EXAM: CT CHEST, ABDOMEN AND PELVIS WITHOUT CONTRAST TECHNIQUE: Multidetector CT imaging of the chest, abdomen and pelvis was performed following the standard protocol without IV contrast. COMPARISON:  07/09/2019 FINDINGS: CT CHEST FINDINGS Cardiovascular: Extensive coronary artery calcifications are seen. There is prosthetic stent/valve in the aortic valve. There is mild ectasia of proximal descending thoracic aorta. Mediastinum/Nodes: No significant lymphadenopathy seen. Lungs/Pleura: There are small patchy ground-glass infiltrates in the periphery of both lungs, more so on the right side. There is no significant pleural effusion or pneumothorax. Musculoskeletal: No significant abnormality is seen. CT ABDOMEN PELVIS FINDINGS Hepatobiliary: There are multiple calcified gallbladder stones. Pancreas: No focal abnormality is seen. Spleen: Unremarkable. Adrenals/Urinary Tract: Adrenals are unremarkable. There is no hydronephrosis. There are few small bilateral renal stones each measuring less than 2 mm in size. There are possible small parapelvic cysts in both kidneys. Ureters are not dilated. Urinary bladder is unremarkable. Stomach/Bowel: Stomach is unremarkable. Small bowel loops are not dilated. Appendix is not dilated. There is no significant wall thickening in colon. Scattered diverticula are seen in colon without signs of focal diverticulitis. Vascular/Lymphatic: There is previous endovascular stent repair of aortic aneurysm. Native aneurysm measures 4.9 x 5 cm with no significant change since 07/09/2019. There is no evidence of  retroperitoneal hematoma. Reproductive: Unremarkable. Other: There is no ascites or pneumoperitoneum. Bilateral inguinal hernias containing fat are seen. Musculoskeletal: Decrease in height of bodies of L2 and L4 vertebrae has not changed. IMPRESSION: There are small scattered ground-glass infiltrates in the periphery of both lungs, more so on the right side. Differential diagnostic possibilities would include interstitial pneumonitis or scarring. There is no focal pulmonary consolidation. There is no significant pleural effusion or pneumothorax. Extensive coronary artery calcifications are seen. Prosthetic valve/stent is seen in the aortic root. There is no evidence of intestinal obstruction or pneumoperitoneum. There is no hydronephrosis. Appendix is not dilated. There is previous endovascular stent repair of infrarenal aortic aneurysm. Overall size of the aneurysm has not changed significantly. There is no retroperitoneal  hematoma. Gallbladder stones.  Diverticulosis of colon.  Renal stones. Other findings as described in the body of the report. Electronically Signed   By: Ernie Avena M.D.   On: 01/07/2021 18:24        Scheduled Meds:  amLODipine  5 mg Oral Daily   clopidogrel  75 mg Oral Daily   dexamethasone  6 mg Oral Q24H   heparin  5,000 Units Subcutaneous Q8H   insulin aspart  0-9 Units Subcutaneous TID WC   Ipratropium-Albuterol  1 puff Inhalation Q6H   rosuvastatin  20 mg Oral Daily   sodium bicarbonate  1,300 mg Oral BID   vitamin B-12  250 mcg Oral Daily   Continuous Infusions:  remdesivir 100 mg in NS 100 mL 100 mg (01/23/21 0838)     LOS: 1 day       Huey Bienenstock, MD [facility]   To contact the attending provider between 7A-7P or the covering provider during after hours 7P-7A, please log into the web site [email or web address] and access using universal [place] password for that web site. If you do not have the password, please call the hospital  operator.  01/23/2021, 3:54 PM   Patient ID: GURSHAN SETTLEMIRE, male   DOB: 06/18/1931, 87 y.o.   MRN: [id number] Patient ID: KAYLOB WALLEN, male   DOB: [birth year], 87 y.o.   MRN: [id number]

## 2021-01-23 NOTE — Progress Notes (Signed)
°  Transition of Care [facility]) Screening Note   Patient Details  Name: Michael Wolfe Date of Birth: 28-Sep-1931   [facility]) CM/SW Contact:    Mearl Latin, LCSW Phone Number: 01/23/2021, 8:40 AM    Transition of Care Department [facility]) has reviewed patient. Patient currently has recommendation for SNF placement, however COVID status and confusion are current barriers. We will continue to monitor patient advancement through interdisciplinary progression rounds.

## 2021-01-24 LAB — COMPREHENSIVE METABOLIC PANEL
ALT: 51 U/L — ABNORMAL HIGH (ref 0–44)
AST: 65 U/L — ABNORMAL HIGH (ref 15–41)
Albumin: 3.4 g/dL — ABNORMAL LOW (ref 3.5–5.0)
Alkaline Phosphatase: 82 U/L (ref 38–126)
Anion gap: 11 (ref 5–15)
BUN: 46 mg/dL — ABNORMAL HIGH (ref 8–23)
CO2: 18 mmol/L — ABNORMAL LOW (ref 22–32)
Calcium: 8.6 mg/dL — ABNORMAL LOW (ref 8.9–10.3)
Chloride: 113 mmol/L — ABNORMAL HIGH (ref 98–111)
Creatinine, Ser: 1.91 mg/dL — ABNORMAL HIGH (ref 0.61–1.24)
GFR, Estimated: 34 mL/min — ABNORMAL LOW (ref >60)
Glucose, Bld: 232 mg/dL — ABNORMAL HIGH (ref 70–99)
Potassium: 4.4 mmol/L (ref 3.5–5.1)
Sodium: 142 mmol/L (ref 135–145)
Total Bilirubin: 0.7 mg/dL (ref 0.3–1.2)
Total Protein: 6.3 g/dL — ABNORMAL LOW (ref 6.5–8.1)

## 2021-01-24 LAB — CBC WITH DIFFERENTIAL/PLATELET
Abs Immature Granulocytes: 0.03 10*3/uL (ref 0.00–0.07)
Basophils Absolute: 0 10*3/uL (ref 0.0–0.1)
Basophils Relative: 0 %
Eosinophils Absolute: 0 10*3/uL (ref 0.0–0.5)
Eosinophils Relative: 0 %
HCT: 49.5 % (ref 39.0–52.0)
Hemoglobin: 17 g/dL (ref 13.0–17.0)
Immature Granulocytes: 0 %
Lymphocytes Relative: 8 %
Lymphs Abs: 0.8 10*3/uL (ref 0.7–4.0)
MCH: 31.9 pg (ref 26.0–34.0)
MCHC: 34.3 g/dL (ref 30.0–36.0)
MCV: 92.9 fL (ref 80.0–100.0)
Monocytes Absolute: 0.5 10*3/uL (ref 0.1–1.0)
Monocytes Relative: 5 %
Neutro Abs: 8.6 10*3/uL — ABNORMAL HIGH (ref 1.7–7.7)
Neutrophils Relative %: 87 %
Platelets: 154 10*3/uL (ref 150–400)
RBC: 5.33 MIL/uL (ref 4.22–5.81)
RDW: 14 % (ref 11.5–15.5)
WBC: 9.9 10*3/uL (ref 4.0–10.5)
nRBC: 0 % (ref 0.0–0.2)

## 2021-01-24 LAB — MAGNESIUM: Magnesium: 2.4 mg/dL (ref 1.7–2.4)

## 2021-01-24 LAB — GLUCOSE, CAPILLARY
Glucose-Capillary: 266 mg/dL — ABNORMAL HIGH (ref 70–99)
Glucose-Capillary: 289 mg/dL — ABNORMAL HIGH (ref 70–99)
Glucose-Capillary: 350 mg/dL — ABNORMAL HIGH (ref 70–99)
Glucose-Capillary: 227 mg/dL — ABNORMAL HIGH (ref 70–99)

## 2021-01-24 LAB — PHOSPHORUS: Phosphorus: 2.9 mg/dL (ref 2.5–4.6)

## 2021-01-24 MED ORDER — LACTATED RINGERS IV SOLN: INTRAVENOUS | Status: DC

## 2021-01-24 MED ORDER — LORAZEPAM 2 MG/ML IJ SOLN
0.5000 mg | Freq: Four times a day (QID) | INTRAMUSCULAR | Status: DC | PRN
  Administered 2021-01-24: 0.5 mg via INTRAVENOUS
  Filled 2021-01-24: qty 1

## 2021-01-24 MED ORDER — ENSURE ENLIVE PO LIQD
237.0000 mL | Freq: Two times a day (BID) | ORAL | Status: DC
  Administered 2021-01-24 (×2): 237 mL via ORAL

## 2021-01-24 MED ORDER — MIDODRINE HCL 5 MG PO TABS
5.0000 mg | ORAL_TABLET | Freq: Three times a day (TID) | ORAL | Status: DC
  Administered 2021-01-24 – 2021-01-28 (×4): 5 mg via ORAL
  Filled 2021-01-24 (×7): qty 1

## 2021-01-24 NOTE — Progress Notes (Signed)
TRH night cross cover note:  I was notified by RN that this patient, who was previously on soft bilateral wrist restraints as well as waist restraint, is altered, attempting to get out of bed, pulling at lines, and interfering with provision of his medical care, refractory to attempts at verbal redirection.  Consequently, I placed order to resume soft bilateral wrist restraints at this time.     Newton Pigg, DO Hospitalist

## 2021-01-24 NOTE — Progress Notes (Signed)
TRH night cross cover note:  EKG appears to show sinus rhythm with second-degree AV block, with evidence of progressive PR prolongation before dropping QRS, suggestive of Mobitz type I process, heart rate 70s, and no evidence of interval T wave or ST changes.  Per my chart review, confirmed that the patient is not currently on any AV nodal blocking agents.  Remains normotensive.    Newton Pigg, DO Hospitalist

## 2021-01-24 NOTE — Progress Notes (Signed)
PROGRESS NOTE    Michael Wolfe  [id number] DOB: 30-Oct-1931 DOA: 12/25/2020 PCP: Charolette Forward, MD   Chief Complaint  Patient presents with   Shortness of Breath   Chest Pain    Brief Narrative:   Michael Wolfe is a 87 y.o. male with medical history significant of AAA, HTN, CAD with stable angina, aortic stenosis, CKD stage II, HLD, IIDM, brought in by family member for increasing shortness of breath, chest pain/epigastric pain.   Patient is confused, unable to provide any history, most history provided by patient son over the phone.  Patient lives with his wife, with son lives 1 hour away.  Last 2 days, patient has had increasing shortness of breath along with chest/epigastric pain, has had poor oral intake for the last 2 days " anything he ate will came straight up right away".  No fever or chills, no diarrhea.  Wife also experienced similar symptoms since yesterday.  Son also reported at baseline patient has had increasing exertional shortness of breath but never complained about chest pains until 2 days ago.  Patient never had COVID vaccination.   Assessment & Plan:   Principal Problem:   COVID Active Problems:   COVID-19 virus infection     COVID-19 infection  -Patient with no hypoxia, but tenuous, with multiple comorbidities including CAD, CKD, diabetes mellitus . -continue With remdesivir . -Continue with short course of p.o. steroids .  AKI on CKD IIIB -From volume depletion and dehydration, hold lisinopril and continue with IV fluids.  -10 is elevated at 1.9 today, I will increase his IV fluids and will check bladder scan.  -CT abdomen pelvis showed no urinary obstructions.  -Avoid low blood pressure, start on midodrine   Metabolic encephalopathy acute -This is most likely due to delirium, versus COVID-19 -CT head/MRI significant for remote stroke, but no acute ischemic event -TSH within normal limit, B12 on the lower side at 348, will start on some  supplements ACS ruled out, echo is reassuring -Patient is suffering from significant hospital delirium, very difficult to manage given his prolonged QTC prohibits antipsychotics, he is requiring as needed Ativan as needed, he received 1 dose this morning for which he remains sleepy during the day.  We will try to avoid benzos as well, I will recheck his EKG as QTC on telemetry monitor is 460.  And if it is appropriate I will start him on low-dose Seroquel.    Elevated troponins -Demand ischemia from COVID and AKI, reassuring 35 > 36 > 41 -no regional wall motion abnormalities on 2D echo   HTN -Continue amlodipine, hold lisinopril, start as needed hydralazine.   IIDM -Hold p.o. diabetic medication, start sliding scale.   CAD -Continue Plavix and statin.   DVT prophylaxis: Heparin Code Status: Full Family Communication: None at bedside today, discussed with son at bedside 12/30 Disposition:   Status is: Inpatient  Remains inpatient appropriate because:        Consultants:  None    Subjective:  With delirium and restlessness overnight for which she required 1 dose of Ativan this morning.     Objective: Vitals:   01/24/21 0600 01/24/21 0744 01/24/21 0800 01/24/21 1146  BP: 95/65 (!) 88/74 106/79 91/74  Pulse: (!) 113 (!) 104 (!) 116   Resp: 20 20 (!) 24 20  Temp:  98.3 F (36.8 C)  98.5 F (36.9 C)  TempSrc:  Axillary  Axillary  SpO2: 94% 92% 93% 94%  Weight:  Height:        Intake/Output Summary (Last 24 hours) at 01/24/2021 1312 Last data filed at 01/23/2021 1900 Gross per 24 hour  Intake 154.71 ml  Output 500 ml  Net -345.29 ml   Filed Weights   01/22/21 0035  Weight: 85.8 kg    Examination:  Altered, frail, deconditioned, drowsy Symmetrical Chest wall movement, diminished air entry at the bases RRR,No Gallops,Rubs or new Murmurs, No Parasternal Heave +ve B.Sounds, Abd Soft, No tenderness, No rebound - guarding or rigidity. No Cyanosis,  Clubbing or edema, No new Rash or bruise      Data Reviewed: I have personally reviewed following labs and imaging studies  CBC: Recent Labs  Lab 12/29/2020 1349 01/22/21 0131 01/23/21 0106 01/24/21 0117  WBC 5.3 4.6 8.1 9.9  NEUTROABS  --  3.7 6.9 8.6*  HGB 15.7 15.0 16.6 17.0  HCT 46.1 44.7 47.8 49.5  MCV 92.0 92.4 92.6 92.9  PLT 110* 98* 120* [id number]    Basic Metabolic Panel: Recent Labs  Lab 01/08/2021 1349 01/22/21 0131 01/23/21 0106 01/24/21 0117  NA 139 138 140 142  K 3.7 4.0 5.5* 4.4  CL 110 111 114* 113*  CO2 18* 17* 16* 18*  GLUCOSE 143* 174* 198* 232*  BUN 35* 32* 33* 46*  CREATININE 2.22* 1.82* 1.48* 1.91*  CALCIUM 8.8* 8.2* 8.4* 8.6*  MG  --  1.9 2.0 2.4  PHOS  --  2.2* 2.6 2.9    GFR: Estimated Creatinine Clearance: 28.5 mL/min (A) (by C-G formula based on SCr of 1.91 mg/dL (H)).  Liver Function Tests: Recent Labs  Lab 12/31/2020 1349 01/22/21 0131 01/23/21 0106 01/24/21 0117  AST 41 37 53* 65*  ALT 46* 40 40 51*  ALKPHOS 72 71 70 82  BILITOT 1.0 1.0 1.7* 0.7  PROT 6.6 6.2* 6.4* 6.3*  ALBUMIN 3.7 3.6 3.4* 3.4*    CBG: Recent Labs  Lab 01/23/21 1215 01/23/21 1658 01/23/21 1944 01/24/21 0751 01/24/21 1148  GLUCAP 246* 261* 229* 266* 289*     Recent Results (from the past 240 hour(s))  Resp Panel by RT-PCR (Flu A&B, Covid) Nasopharyngeal Swab     Status: Abnormal   Collection Time: 12/27/2020  4:50 PM   Specimen: Nasopharyngeal Swab; Nasopharyngeal(NP) swabs in vial transport medium  Result Value Ref Range Status   SARS Coronavirus 2 by RT PCR POSITIVE (A) NEGATIVE Final    Comment: (NOTE) SARS-CoV-2 target nucleic acids are DETECTED.  The SARS-CoV-2 RNA is generally detectable in upper respiratory specimens during the acute phase of infection. Positive results are indicative of the presence of the identified virus, but do not rule out bacterial infection or co-infection with other pathogens not detected by the test. Clinical  correlation with patient history and other diagnostic information is necessary to determine patient infection status. The expected result is Negative.  Fact Sheet for Patients: EntrepreneurPulse.com.au  Fact Sheet for Healthcare Providers: IncredibleEmployment.be  This test is not yet approved or cleared by the [place] FDA and  has been authorized for detection and/or diagnosis of SARS-CoV-2 by FDA under an Emergency Use Authorization (EUA).  This EUA will remain in effect (meaning this test can be used) for the duration of  the COVID-19 declaration under Section 564(b)(1) of the A ct, 21 U.S.C. section 360bbb-3(b)(1), unless the authorization is terminated or revoked sooner.     Influenza A by PCR NEGATIVE NEGATIVE Final   Influenza B by PCR NEGATIVE NEGATIVE Final    Comment: (NOTE)  The Xpert Xpress SARS-CoV-2/FLU/RSV plus assay is intended as an aid in the diagnosis of influenza from Nasopharyngeal swab specimens and should not be used as a sole basis for treatment. Nasal washings and aspirates are unacceptable for Xpert Xpress SARS-CoV-2/FLU/RSV testing.  Fact Sheet for Patients: EntrepreneurPulse.com.au  Fact Sheet for Healthcare Providers: IncredibleEmployment.be  This test is not yet approved or cleared by the [place] FDA and has been authorized for detection and/or diagnosis of SARS-CoV-2 by FDA under an Emergency Use Authorization (EUA). This EUA will remain in effect (meaning this test can be used) for the duration of the COVID-19 declaration under Section 564(b)(1) of the Act, 21 U.S.C. section 360bbb-3(b)(1), unless the authorization is terminated or revoked.  Performed at [facility], [place] [address] [place], [place] [address]   Culture, blood (routine x 2)     Status: None (Preliminary result)   Collection Time: 01/22/21  5:04 AM   Specimen: BLOOD RIGHT HAND  Result  Value Ref Range Status   Specimen Description BLOOD RIGHT HAND  Final   Special Requests   Final    BOTTLES DRAWN AEROBIC AND ANAEROBIC Blood Culture adequate volume   Culture   Final    NO GROWTH 2 DAYS Performed at [facility], [place] [address], [place], [place] [address]    Report Status PENDING  Incomplete  Culture, blood (routine x 2)     Status: None (Preliminary result)   Collection Time: 01/22/21  5:04 AM   Specimen: BLOOD RIGHT ARM  Result Value Ref Range Status   Specimen Description BLOOD RIGHT ARM  Final   Special Requests   Final    BOTTLES DRAWN AEROBIC AND ANAEROBIC Blood Culture adequate volume   Culture   Final    NO GROWTH 2 DAYS Performed at [facility], [place] [address] [place], [place] [address]    Report Status PENDING  Incomplete  Urine Culture     Status: Abnormal   Collection Time: 01/22/21  5:21 AM   Specimen: Urine, Clean Catch  Result Value Ref Range Status   Specimen Description URINE, CLEAN CATCH  Final   Special Requests   Final    NONE Performed at [facility], [place] [address], [place],  [address]    Culture MULTIPLE SPECIES PRESENT, SUGGEST RECOLLECTION (A)  Final   Report Status 01/23/2021 FINAL  Final         Radiology Studies: No results found.      Scheduled Meds:  amLODipine  5 mg Oral Daily   clopidogrel  75 mg Oral Daily   dexamethasone  6 mg Oral Q24H   feeding supplement  237 mL Oral BID BM   heparin  5,000 Units Subcutaneous Q8H   insulin aspart  0-9 Units Subcutaneous TID WC   rosuvastatin  20 mg Oral Daily   sodium bicarbonate  1,300 mg Oral BID   vitamin B-12  250 mcg Oral Daily   Continuous Infusions:  remdesivir 100 mg in NS 100 mL 100 mg (01/24/21 0832)     LOS: 2 days       Phillips Climes, MD [facility]   To contact the attending provider between 7A-7P or the covering provider during after hours 7P-7A, please log into the web site [email or web address] and access using  universal [place] password for that web site. If you do not have the password, please call the hospital operator.  01/24/2021, 1:12 PM   Patient ID:  Michael Wolfe, male   DOB: 14-May-1931, 87 y.o.   MRN: [id number] Patient ID: Michael Wolfe, male   DOB: May 23, 1931, 87 y.o.   MRN: [id number] Patient ID: Michael Wolfe, male   DOB: 1931-02-28, 87 y.o.   MRN: [id number]

## 2021-01-24 NOTE — Progress Notes (Signed)
TRH night cross cover note:  Patient with a known history of junctional rhythm. I was notified by RN that telemetry suggestive of more irregularly occurring QRS complexes.  Per my chart review, this is in the context of no overt/documented history of atrial fibrillation.  Heart rates in the 90s to low 100s, with normotensive blood pressures.  EKG ordered to assess for potential new diagnosis of atrial fibrillation, which, if present, appears rate controlled at this time, with normotensive BP's.     Newton Pigg, DO Hospitalist

## 2021-01-25 DIAGNOSIS — G9349 Other encephalopathy: Secondary | ICD-10-CM

## 2021-01-25 DIAGNOSIS — U071 COVID-19: Secondary | ICD-10-CM

## 2021-01-25 LAB — CBC WITH DIFFERENTIAL/PLATELET
Abs Immature Granulocytes: 0 10*3/uL (ref 0.00–0.07)
Basophils Absolute: 0 10*3/uL (ref 0.0–0.1)
Basophils Relative: 0 %
Eosinophils Absolute: 0 10*3/uL (ref 0.0–0.5)
Eosinophils Relative: 0 %
HCT: 49.8 % (ref 39.0–52.0)
Hemoglobin: 16.9 g/dL (ref 13.0–17.0)
Lymphocytes Relative: 3 %
Lymphs Abs: 0.4 10*3/uL — ABNORMAL LOW (ref 0.7–4.0)
MCH: 31.8 pg (ref 26.0–34.0)
MCHC: 33.9 g/dL (ref 30.0–36.0)
MCV: 93.6 fL (ref 80.0–100.0)
Monocytes Absolute: 0.8 10*3/uL (ref 0.1–1.0)
Monocytes Relative: 6 %
Neutro Abs: 11.7 10*3/uL — ABNORMAL HIGH (ref 1.7–7.7)
Neutrophils Relative %: 91 %
Platelets: 148 10*3/uL — ABNORMAL LOW (ref 150–400)
RBC: 5.32 MIL/uL (ref 4.22–5.81)
RDW: 14.2 % (ref 11.5–15.5)
WBC: 12.9 10*3/uL — ABNORMAL HIGH (ref 4.0–10.5)
nRBC: 0 % (ref 0.0–0.2)

## 2021-01-25 LAB — COMPREHENSIVE METABOLIC PANEL
ALT: 56 U/L — ABNORMAL HIGH (ref 0–44)
AST: 60 U/L — ABNORMAL HIGH (ref 15–41)
Albumin: 3.4 g/dL — ABNORMAL LOW (ref 3.5–5.0)
Alkaline Phosphatase: 82 U/L (ref 38–126)
Anion gap: 10 (ref 5–15)
BUN: 56 mg/dL — ABNORMAL HIGH (ref 8–23)
CO2: 19 mmol/L — ABNORMAL LOW (ref 22–32)
Calcium: 8.7 mg/dL — ABNORMAL LOW (ref 8.9–10.3)
Chloride: 117 mmol/L — ABNORMAL HIGH (ref 98–111)
Creatinine, Ser: 1.93 mg/dL — ABNORMAL HIGH (ref 0.61–1.24)
GFR, Estimated: 33 mL/min — ABNORMAL LOW (ref >60)
Glucose, Bld: 249 mg/dL — ABNORMAL HIGH (ref 70–99)
Potassium: 4.2 mmol/L (ref 3.5–5.1)
Sodium: 146 mmol/L — ABNORMAL HIGH (ref 135–145)
Total Bilirubin: 0.9 mg/dL (ref 0.3–1.2)
Total Protein: 6.2 g/dL — ABNORMAL LOW (ref 6.5–8.1)

## 2021-01-25 LAB — GLUCOSE, CAPILLARY
Glucose-Capillary: 289 mg/dL — ABNORMAL HIGH (ref 70–99)
Glucose-Capillary: 284 mg/dL — ABNORMAL HIGH (ref 70–99)
Glucose-Capillary: 186 mg/dL — ABNORMAL HIGH (ref 70–99)
Glucose-Capillary: 174 mg/dL — ABNORMAL HIGH (ref 70–99)

## 2021-01-25 LAB — MAGNESIUM: Magnesium: 2.3 mg/dL (ref 1.7–2.4)

## 2021-01-25 LAB — PHOSPHORUS: Phosphorus: 2.7 mg/dL (ref 2.5–4.6)

## 2021-01-25 MED ORDER — ENSURE ENLIVE PO LIQD: 237.0000 mL | Freq: Three times a day (TID) | ORAL | Status: DC

## 2021-01-25 MED ORDER — SODIUM BICARBONATE 650 MG PO TABS
1300.0000 mg | ORAL_TABLET | Freq: Two times a day (BID) | ORAL | Status: DC
  Administered 2021-01-26 (×2): 1300 mg via ORAL
  Filled 2021-01-25 (×3): qty 2

## 2021-01-25 MED ORDER — KETOROLAC TROMETHAMINE 15 MG/ML IJ SOLN
15.0000 mg | Freq: Once | INTRAMUSCULAR | Status: AC
Start: 2021-01-25 — End: 2021-01-25
  Administered 2021-01-25: 15 mg via INTRAVENOUS
  Filled 2021-01-25: qty 1

## 2021-01-25 NOTE — Progress Notes (Signed)
TRH night cross cover note:  I was notified by RN regarding concerns relating to the patient's nutritional status in the setting of very little oral intake over the last few days.  Along this line, she also conveys concerns regarding patient's volume status, conveying a perception of dehydration as evidenced by dry oral mucosa membranes, resulting in lip cracking and desiccation related inflammation, without angioedema, further complicated by the patient's refusal of oral care, while noting that the patient has been receiving continuous LR at 100 cc/h over that timeframe. Patient with delicate fluid balance in the setting of a documented history of severe aortic stenosis.  In terms of non-allergy-related oral swelling/lip cracking in this patient refusing oral care, i'll place one-time oral for toradol 15 mg IV.    Newton Pigg, DO Hospitalist

## 2021-01-25 NOTE — Progress Notes (Addendum)
Michael Wolfe continues to be lethargic throughout the day. I am unable to administer his medications due to increase lethargy. MD aware. When attempting to arouse Michael Wolfe he opens his eyes then closes them and goes back to sleep. I completed oral care on Michael Wolfe x3 by mouth swab and oral gel to moisten oral mucosa. Tolerated that well with encouragement.

## 2021-01-25 NOTE — Evaluation (Signed)
Occupational Therapy Evaluation Patient Details Name: Michael Wolfe MRN: [id number] DOB: 12/04/1931 Today's Date: 01/25/2021   History of Present Illness 88 y.o. male brought in by family member 01/26/21 for increasing shortness of breath, chest pain/epigastric pain, vomiting. COVID+, encephalopathy; CT head "encephalomalacia in the left posterior parietal white matter. MRI correlation is suggested to exclude infarct or mass lesion." MRI pending  PMH significant of AAA, HTN, CAD with stable angina, aortic stenosis, CKD stage II, HLD, IIDM   Clinical Impression   [name] was generally indep PTA, he lives with his wife who is home 24/7 however pt's son states she [place] unable to assist/take care of the pt at d/c. Upon evaluation pt was lethargic and required increased cues to initiate any movement. He required max A to transfer to sitting, however once sitting pt had fair balance without external support. He currently required up to max A for all aspects of his care due to lethargy, generalized weakness, impaired cognition and poor activity tolerance. He will benefit from OT acutely. Recommend SNF at d/c.      Recommendations for follow up therapy are one component of a multi-disciplinary discharge planning process, led by the attending physician.  Recommendations may be updated based on patient status, additional functional criteria and insurance authorization.   Follow Up Recommendations  Skilled nursing-short term rehab (<3 hours/day)    Assistance Recommended at Discharge Frequent or constant Supervision/Assistance  Functional Status Assessment  Patient has had a recent decline in their functional status and/or demonstrates limited ability to make significant improvements in function in a reasonable and predictable amount of time  Equipment Recommendations  None recommended by OT    Recommendations for Other Services       Precautions / Restrictions Precautions Precautions:  Fall Restrictions Weight Bearing Restrictions: No      Mobility Bed Mobility Overal bed mobility: Needs Assistance Bed Mobility: Rolling;Sidelying to Sit;Sit to Sidelying Rolling: Max assist Sidelying to sit: Max assist     Sit to sidelying: Max assist General bed mobility comments: pt initiating movement wtih verbal cues, ultimately max A    Transfers Overall transfer level: Needs assistance                 General transfer comment: defer for safety      Balance Overall balance assessment: Needs assistance Sitting-balance support: Feet supported Sitting balance-Leahy Scale: Fair Sitting balance - Comments: no assist for sitting balance                                   ADL either performed or assessed with clinical judgement   ADL Overall ADL's : Needs assistance/impaired Eating/Feeding: Total assistance Eating/Feeding Details (indicate cue type and reason): pt is not eating or drinking per report Grooming: Maximal assistance;Sitting   Upper Body Bathing: Maximal assistance;Sitting   Lower Body Bathing: Maximal assistance;+2 for physical assistance;+2 for safety/equipment;Sit to/from stand   Upper Body Dressing : Maximal assistance;Sitting   Lower Body Dressing: Maximal assistance;+2 for physical assistance;+2 for safety/equipment;Sit to/from stand   Toilet Transfer: Maximal assistance;+2 for physical assistance;+2 for safety/equipment   Toileting- Clothing Manipulation and Hygiene: Maximal assistance;+2 for physical assistance;+2 for safety/equipment;Sit to/from stand       Functional mobility during ADLs: Maximal assistance;+2 for physical assistance;Caregiver able to provide necessary level of assistance General ADL Comments: pt limited by lethargy, impaired cognition and general weakness     Vision Baseline Vision/History:  1 Wears glasses Ability to See in Adequate Light: 0 Adequate Patient Visual Report: No change from  baseline Vision Assessment?: No apparent visual deficits     Perception     Praxis      Pertinent Vitals/Pain       Hand Dominance     Extremity/Trunk Assessment Upper Extremity Assessment Upper Extremity Assessment: Generalized weakness;Difficult to assess due to impaired cognition (PROM is generally [facility])   Lower Extremity Assessment Lower Extremity Assessment: Defer to PT evaluation   Cervical / Trunk Assessment Cervical / Trunk Assessment: Kyphotic (forward head posture)   Communication Communication Communication: HOH   Cognition Arousal/Alertness: Lethargic Behavior During Therapy: Flat affect Overall Cognitive Status: History of cognitive impairments - at baseline                                 General Comments: pt initiating movements after verbal cue. did not verbalize any during the session. eyes closed much for the session.     General Comments  HR to 1116, resting in the 90s    Exercises     Shoulder Instructions      Home Living Family/patient expects to be discharged to:: Private residence Living Arrangements: Spouse/significant other Available Help at Discharge: Available 24 hours/day;Available PRN/intermittently;Family Type of Home: House                           Additional Comments: wife is unable to assist      Prior Functioning/Environment Prior Level of Function : Independent/Modified Independent             Mobility Comments: no AD ADLs Comments: indep        OT Problem List: Decreased strength;Decreased range of motion;Decreased activity tolerance;Impaired balance (sitting and/or standing);Decreased cognition;Decreased safety awareness;Decreased knowledge of use of DME or AE;Decreased knowledge of precautions      OT Treatment/Interventions: Self-care/ADL training;Therapeutic exercise;Balance training;Patient/family education;Therapeutic activities;Energy conservation;DME and/or AE instruction    OT  Goals(Current goals can be found in the care plan section) Acute Rehab OT Goals Patient Stated Goal: unable to state OT Goal Formulation: Patient unable to participate in goal setting Time For Goal Achievement: 02/08/21 Potential to Achieve Goals: Good ADL Goals Pt Will Perform Grooming: with set-up;sitting Pt Will Perform Upper Body Dressing: with set-up;sitting Pt Will Perform Lower Body Dressing: with min guard assist;sit to/from stand Pt Will Transfer to Toilet: with min guard assist;ambulating  OT Frequency: Min 2X/week   Barriers to D/C:            Co-evaluation              AM-PAC OT "6 Clicks" Daily Activity     Outcome Measure Help from another person eating meals?: A Lot Help from another person taking care of personal grooming?: A Lot Help from another person toileting, which includes using toliet, bedpan, or urinal?: A Lot Help from another person bathing (including washing, rinsing, drying)?: A Lot Help from another person to put on and taking off regular upper body clothing?: A Lot Help from another person to put on and taking off regular lower body clothing?: A Lot 6 Click Score: 12   End of Session Equipment Utilized During Treatment: Oxygen Nurse Communication: Mobility status  Activity Tolerance: Patient limited by lethargy Patient left: in bed;with call bell/phone within reach;with restraints reapplied;with family/visitor present  OT Visit Diagnosis: Other abnormalities of  gait and mobility (R26.89);Muscle weakness (generalized) (M62.81);Pain                Time: 1550-1610 OT Time Calculation (min): 20 min Charges:  OT General Charges $OT Visit: 1 Visit OT Evaluation $OT Eval Moderate Complexity: 1 Mod   Kodey Xue A Marnell Mcdaniel 01/25/2021, 4:22 PM

## 2021-01-25 NOTE — Progress Notes (Signed)
PROGRESS NOTE    Michael Wolfe  VZS:[id number] DOB: [birth year] DOA: 02/04/21 PCP: Rinaldo Cloud, MD   Chief Complaint  Patient presents with   Shortness of Breath   Chest Pain    Brief Narrative:   Michael Wolfe is a 88 y.o. male with medical history significant of AAA, HTN, CAD with stable angina, aortic stenosis, CKD stage II, HLD, IIDM, brought in by family member for increasing shortness of breath, chest pain/epigastric pain.   Patient is confused, unable to provide any history, most history provided by patient son over the phone.  Patient lives with his wife, with son lives 1 hour away.  Last 2 days, patient has had increasing shortness of breath along with chest/epigastric pain, has had poor oral intake for the last 2 days " anything he ate will came straight up right away".  No fever or chills, no diarrhea.  Wife also experienced similar symptoms since yesterday.  Son also reported at baseline patient has had increasing exertional shortness of breath but never complained about chest pains until 2 days ago.  Patient never had COVID vaccination.   Assessment & Plan:   Principal Problem:   COVID Active Problems:   COVID-19 virus infection     COVID-19 infection  -Patient with no hypoxia, but tenuous, with multiple comorbidities including CAD, CKD, diabetes mellitus . -continue With remdesivir . -Continue with short course of p.o. steroids .  AKI on CKD IIIB -From volume depletion and dehydration, hold lisinopril and continue with IV fluids.  - fattening remains elevated, continue with IV fluids , no signs of volume overload.  . -CT abdomen pelvis showed no urinary obstructions.  -Avoid low blood pressure, started midodrine with holding parameters.  -We will start on sodium bicarb.  Hypernatremia -Already on LR, will encourage free water intake.  Metabolic encephalopathy acute -This is most likely due to delirium, versus COVID-19 -CT head/MRI significant for  remote stroke, but no acute ischemic event -TSH within normal limit, B12 on the lower side at 348, will start on some supplements ACS ruled out, echo is reassuring -Encephalopathy remains significant problem, this is most likely combination between COVID-19 encephalopathy and hospital delirium, prolonged QTC limiting antipsychotic use including Haldol and Seroquel. -Very difficult situation to manage here, as unable to use antipsychotics due to QTC, as well unable to use benzo as keep him sleeping most of the day, I have discussed with family, they will try to be available as much as they can to encourage patient to eat and to communicate with him.    Elevated troponins -Demand ischemia from COVID and AKI, reassuring 35 > 36 > 41 -no regional wall motion abnormalities on 2D echo   HTN -Blood pressure is labile, so I have started on midodrine, I will hold his antihypertensive regimen, but will keep him on as needed hydralazine.  Marland Kitchen   IIDM -Hold p.o. diabetic medication, started on insulin sliding scale   CAD -Continue Plavix and statin.  Failure to thrive -Patient remains with very poor oral intake mainly due to encephalopathy, he has been started on multiple supplements, I will start him on Marinol as well, will change him to dysphagia 2 diet given poor dentition.   DVT prophylaxis: Heparin Code Status: Full Family Communication: None at bedside today, have tried to call both sons and wife, but unable to reach any. Disposition:   Status is: Inpatient  Remains inpatient appropriate because:        Consultants:  None  Subjective:  Patient remains with poor oral intake, remains with delirium, remains on restraints.   Objective: Vitals:   01/25/21 0100 01/25/21 0200 01/25/21 0300 01/25/21 0813  BP:  (!) 158/89  (!) 157/89  Pulse: (!) 113 (!) 119 99 (!) 103  Resp: (!) 26 (!) 35 20   Temp:    97.8 F (36.6 C)  TempSrc:    Axillary  SpO2: 92% 92% 92% 95%  Weight:       Height:        Intake/Output Summary (Last 24 hours) at 01/25/2021 1026 Last data filed at 01/25/2021 0600 Gross per 24 hour  Intake 849.41 ml  Output 1800 ml  Net -950.59 ml   Filed Weights   01/22/21 0035  Weight: 85.8 kg    Examination:  Patient is ill-appearing, frail, lethargic open eyes to loud verbal stimuli Symmetrical Chest wall movement, Good air movement bilaterally, CTAB RRR,No Gallops,Rubs or new Murmurs, No Parasternal Heave +ve B.Sounds, Abd Soft, No tenderness, No rebound - guarding or rigidity. No Cyanosis, Clubbing or edema, No new Rash or bruise      Data Reviewed: I have personally reviewed following labs and imaging studies  CBC: Recent Labs  Lab 01/03/2021 1349 01/22/21 0131 01/23/21 0106 01/24/21 0117 01/25/21 0117  WBC 5.3 4.6 8.1 9.9 12.9*  NEUTROABS  --  3.7 6.9 8.6* 11.7*  HGB 15.7 15.0 16.6 17.0 16.9  HCT 46.1 44.7 47.8 49.5 49.8  MCV 92.0 92.4 92.6 92.9 93.6  PLT 110* 98* 120* 154 148*    Basic Metabolic Panel: Recent Labs  Lab 12/27/2020 1349 01/22/21 0131 01/23/21 0106 01/24/21 0117 01/25/21 0117  NA 139 138 140 142 146*  K 3.7 4.0 5.5* 4.4 4.2  CL 110 111 114* 113* 117*  CO2 18* 17* 16* 18* 19*  GLUCOSE 143* 174* 198* 232* 249*  BUN 35* 32* 33* 46* 56*  CREATININE 2.22* 1.82* 1.48* 1.91* 1.93*  CALCIUM 8.8* 8.2* 8.4* 8.6* 8.7*  MG  --  1.9 2.0 2.4 2.3  PHOS  --  2.2* 2.6 2.9 2.7    GFR: Estimated Creatinine Clearance: 28.2 mL/min (A) (by C-G formula based on SCr of 1.93 mg/dL (H)).  Liver Function Tests: Recent Labs  Lab 12/27/2020 1349 01/22/21 0131 01/23/21 0106 01/24/21 0117 01/25/21 0117  AST 41 37 53* 65* 60*  ALT 46* 40 40 51* 56*  ALKPHOS 72 71 70 82 82  BILITOT 1.0 1.0 1.7* 0.7 0.9  PROT 6.6 6.2* 6.4* 6.3* 6.2*  ALBUMIN 3.7 3.6 3.4* 3.4* 3.4*    CBG: Recent Labs  Lab 01/24/21 0751 01/24/21 1148 01/24/21 1635 01/24/21 2020 01/25/21 0816  GLUCAP 266* 289* 350* 227* 289*     Recent Results (from  the past 240 hour(s))  Resp Panel by RT-PCR (Flu A&B, Covid) Nasopharyngeal Swab     Status: Abnormal   Collection Time: 01/01/2021  4:50 PM   Specimen: Nasopharyngeal Swab; Nasopharyngeal(NP) swabs in vial transport medium  Result Value Ref Range Status   SARS Coronavirus 2 by RT PCR POSITIVE (A) NEGATIVE Final    Comment: (NOTE) SARS-CoV-2 target nucleic acids are DETECTED.  The SARS-CoV-2 RNA is generally detectable in upper respiratory specimens during the acute phase of infection. Positive results are indicative of the presence of the identified virus, but do not rule out bacterial infection or co-infection with other pathogens not detected by the test. Clinical correlation with patient history and other diagnostic information is necessary to determine patient infection status. The  expected result is Negative.  Fact Sheet for Patients: EntrepreneurPulse.com.au  Fact Sheet for Healthcare Providers: IncredibleEmployment.be  This test is not yet approved or cleared by the [place] FDA and  has been authorized for detection and/or diagnosis of SARS-CoV-2 by FDA under an Emergency Use Authorization (EUA).  This EUA will remain in effect (meaning this test can be used) for the duration of  the COVID-19 declaration under Section 564(b)(1) of the A ct, 21 U.S.C. section 360bbb-3(b)(1), unless the authorization is terminated or revoked sooner.     Influenza A by PCR NEGATIVE NEGATIVE Final   Influenza B by PCR NEGATIVE NEGATIVE Final    Comment: (NOTE) The Xpert Xpress SARS-CoV-2/FLU/RSV plus assay is intended as an aid in the diagnosis of influenza from Nasopharyngeal swab specimens and should not be used as a sole basis for treatment. Nasal washings and aspirates are unacceptable for Xpert Xpress SARS-CoV-2/FLU/RSV testing.  Fact Sheet for Patients: EntrepreneurPulse.com.au  Fact Sheet for Healthcare  Providers: IncredibleEmployment.be  This test is not yet approved or cleared by the [place] FDA and has been authorized for detection and/or diagnosis of SARS-CoV-2 by FDA under an Emergency Use Authorization (EUA). This EUA will remain in effect (meaning this test can be used) for the duration of the COVID-19 declaration under Section 564(b)(1) of the Act, 21 U.S.C. section 360bbb-3(b)(1), unless the authorization is terminated or revoked.  Performed at [facility], [place] [address] [place], [place] [address]   Culture, blood (routine x 2)     Status: None (Preliminary result)   Collection Time: 01/22/21  5:04 AM   Specimen: BLOOD RIGHT HAND  Result Value Ref Range Status   Specimen Description BLOOD RIGHT HAND  Final   Special Requests   Final    BOTTLES DRAWN AEROBIC AND ANAEROBIC Blood Culture adequate volume   Culture   Final    NO GROWTH 2 DAYS Performed at [facility], [place] [address], [place], [place] [address]    Report Status PENDING  Incomplete  Culture, blood (routine x 2)     Status: None (Preliminary result)   Collection Time: 01/22/21  5:04 AM   Specimen: BLOOD RIGHT ARM  Result Value Ref Range Status   Specimen Description BLOOD RIGHT ARM  Final   Special Requests   Final    BOTTLES DRAWN AEROBIC AND ANAEROBIC Blood Culture adequate volume   Culture   Final    NO GROWTH 2 DAYS Performed at [facility], [place] [address] [place], [place] [address]    Report Status PENDING  Incomplete  Urine Culture     Status: Abnormal   Collection Time: 01/22/21  5:21 AM   Specimen: Urine, Clean Catch  Result Value Ref Range Status   Specimen Description URINE, CLEAN CATCH  Final   Special Requests   Final    NONE Performed at [facility], [place] [address] [place], [place] [address]    Culture MULTIPLE SPECIES PRESENT, SUGGEST RECOLLECTION (A)  Final   Report Status 01/23/2021 FINAL  Final         Radiology  Studies: No results found.      Scheduled Meds:  amLODipine  5 mg Oral Daily   clopidogrel  75 mg Oral Daily   dexamethasone  6 mg Oral Q24H   feeding supplement  237 mL Oral TID BM   heparin  5,000 Units Subcutaneous Q8H   insulin aspart  0-9 Units Subcutaneous TID WC  midodrine  5 mg Oral TID WC   rosuvastatin  20 mg Oral Daily   sodium bicarbonate  1,300 mg Oral BID   vitamin B-12  250 mcg Oral Daily   Continuous Infusions:  lactated ringers 100 mL/hr at 01/25/21 0925     LOS: 3 days       Phillips Climes, MD [facility]   To contact the attending provider between 7A-7P or the covering provider during after hours 7P-7A, please log into the web site [email or web address] and access using universal [place] password for that web site. If you do not have the password, please call the hospital operator.  01/25/2021, 10:26 AM   Patient ID: Michael Wolfe, male   DOB: 22-Nov-1931, 88 y.o.   MRN: [id number] Patient ID: Michael Wolfe, male   DOB: Oct 09, 1931, 88 y.o.   MRN: [id number] Patient ID: Michael Wolfe, male   DOB: 20-Apr-1931, 88 y.o.   MRN: [id number] Patient remains encephalopathic, confused patient ID: Michael Wolfe, male   DOB: 1932-01-12, 88 y.o.   MRN: [id number]

## 2021-01-25 NOTE — TOC Initial Note (Signed)
Transition of Care [facility]) - Initial/Assessment Note    Patient Details  Name: Michael Wolfe MRN: [id number] Date of Birth: [birth year]  [facility] CM/SW Contact:    Lorri Frederick, LCSW Phone Number: 01/25/2021, 9:43 AM  Clinical Narrative:  Pt disoriented per epic.  CSW spoke with son Brett Canales by phone, Brett Canales and brother Nicklos are from several hours away, currently on their way to see pt.  Discussed PT recommendations for SNF.  Per Brett Canales, "he will fight you on that every step of the way."  Pt lives with wife at home, still very independent, drives, takes care of self.  Pt has never been to SNF before.  CSW went over PT note with son, pt only walking 3 feet on 12/29, may improve.  Pt also covid positive on 12/28 and currently in quarantine.  Brett Canales agreed to talk with pt and with pt wife today about DC plan: SNF vs home.                    Expected Discharge Plan:  (TBD) Barriers to Discharge: Continued Medical Work up   Patient Goals and CMS Choice Patient states their goals for this hospitalization and ongoing recovery are:: "get back home" per son      Expected Discharge Plan and Services Expected Discharge Plan:  (TBD)     Post Acute Care Choice:  (TBD) Living arrangements for the past 2 months: Single Family Home                                      Prior Living Arrangements/Services Living arrangements for the past 2 months: Single Family Home Lives with:: Spouse Patient language and need for interpreter reviewed:: No        Need for Family Participation in Patient Care: Yes (Comment) Care giver support system in place?: Yes (comment) Current home services: Other (comment) (none) Criminal Activity/Legal Involvement Pertinent to Current Situation/Hospitalization: No - Comment as needed  Activities of Daily Living Home Assistive Devices/Equipment: None ADL Screening (condition at time of admission) Patient's cognitive ability adequate to safely  complete daily activities?: Yes Is the patient deaf or have difficulty hearing?: Yes Does the patient have difficulty seeing, even when wearing glasses/contacts?: No Does the patient have difficulty concentrating, remembering, or making decisions?: Yes Patient able to express need for assistance with ADLs?: Yes Does the patient have difficulty dressing or bathing?: Yes Independently performs ADLs?: No Communication: Needs assistance Is this a change from baseline?: Change from baseline, expected to last <3 days Dressing (OT): Needs assistance Is this a change from baseline?: Change from baseline, expected to last <3days Grooming: Needs assistance Is this a change from baseline?: Change from baseline, expected to last <3 days Feeding: Needs assistance Is this a change from baseline?: Change from baseline, expected to last <3 days Bathing: Needs assistance Is this a change from baseline?: Change from baseline, expected to last <3 days Toileting: Needs assistance Is this a change from baseline?: Change from baseline, expected to last <3 days In/Out Bed: Needs assistance Is this a change from baseline?: Change from baseline, expected to last <3 days Walks in Home: Needs assistance Is this a change from baseline?: Change from baseline, expected to last <3 days Does the patient have difficulty walking or climbing stairs?: Yes Weakness of Legs: Both Weakness of Arms/Hands: Both  Permission Sought/Granted  Emotional Assessment Appearance::  (unknown: phone assessment due to covid) Attitude/Demeanor/Rapport: Unable to Assess Affect (typically observed): Unable to Assess Orientation: :  (not oriented, per epic) Alcohol / Substance Use: Not Applicable Psych Involvement: No (comment)  Admission diagnosis:  COVID [U07.1] COVID-19 [U07.1] Patient Active Problem List   Diagnosis Date Noted   COVID-19 virus infection 01/08/2021   COVID 01/06/2021   AAA (abdominal  aortic aneurysm) 08/22/2014   CAD (coronary artery disease) 08/12/2014   Abdominal aortic aneurysm 08/12/2014   Sinus bradycardia 08/12/2014   Aortic stenosis, severe 06/25/2014   Severe aortic stenosis 05/06/2014   PCP:  Rinaldo Cloud, MD Pharmacy:   [facility] [phone] [place], [place] - [address] AT [facility] [address] [place] [address] Phone: [phone] Fax: [phone]     Social Determinants of Health (SDOH) Interventions    Readmission Risk Interventions No flowsheet data found.

## 2021-01-25 NOTE — Progress Notes (Signed)
TRH night cross cover note:  I was notified by RN that patient's existing order for soft bilateral wrist restraints was set to expire at 2300 on 01/24/2021 in the context of patient's ongoing confusion, agitation, pulling at lines, and otherwise interfering with medical care, in the context of this behavior remaining refractory to attempts at verbal redirection.  Consequently, I have renewed the aforementioned order for soft bilateral wrist restraints.    Newton Pigg, DO Hospitalist

## 2021-01-25 DEATH — deceased

## 2021-01-26 ENCOUNTER — Inpatient Hospital Stay (HOSPITAL_COMMUNITY): Payer: Medicare Other

## 2021-01-26 LAB — CBC WITH DIFFERENTIAL/PLATELET
Abs Immature Granulocytes: 0.07 10*3/uL (ref 0.00–0.07)
Basophils Absolute: 0 10*3/uL (ref 0.0–0.1)
Basophils Relative: 0 %
Eosinophils Absolute: 0 10*3/uL (ref 0.0–0.5)
Eosinophils Relative: 0 %
HCT: 48.6 % (ref 39.0–52.0)
Hemoglobin: 16.3 g/dL (ref 13.0–17.0)
Immature Granulocytes: 1 %
Lymphocytes Relative: 7 %
Lymphs Abs: 1.1 10*3/uL (ref 0.7–4.0)
MCH: 32 pg (ref 26.0–34.0)
MCHC: 33.5 g/dL (ref 30.0–36.0)
MCV: 95.5 fL (ref 80.0–100.0)
Monocytes Absolute: 1.3 10*3/uL — ABNORMAL HIGH (ref 0.1–1.0)
Monocytes Relative: 9 %
Neutro Abs: 12 10*3/uL — ABNORMAL HIGH (ref 1.7–7.7)
Neutrophils Relative %: 83 %
Platelets: 145 10*3/uL — ABNORMAL LOW (ref 150–400)
RBC: 5.09 MIL/uL (ref 4.22–5.81)
RDW: 14 % (ref 11.5–15.5)
WBC: 14.5 10*3/uL — ABNORMAL HIGH (ref 4.0–10.5)
nRBC: 0 % (ref 0.0–0.2)

## 2021-01-26 LAB — COMPREHENSIVE METABOLIC PANEL
ALT: 40 U/L (ref 0–44)
AST: 42 U/L — ABNORMAL HIGH (ref 15–41)
Albumin: 3.2 g/dL — ABNORMAL LOW (ref 3.5–5.0)
Alkaline Phosphatase: 71 U/L (ref 38–126)
Anion gap: 11 (ref 5–15)
BUN: 53 mg/dL — ABNORMAL HIGH (ref 8–23)
CO2: 22 mmol/L (ref 22–32)
Calcium: 8.6 mg/dL — ABNORMAL LOW (ref 8.9–10.3)
Chloride: 116 mmol/L — ABNORMAL HIGH (ref 98–111)
Creatinine, Ser: 1.91 mg/dL — ABNORMAL HIGH (ref 0.61–1.24)
GFR, Estimated: 34 mL/min — ABNORMAL LOW (ref >60)
Glucose, Bld: 226 mg/dL — ABNORMAL HIGH (ref 70–99)
Potassium: 4.8 mmol/L (ref 3.5–5.1)
Sodium: 149 mmol/L — ABNORMAL HIGH (ref 135–145)
Total Bilirubin: 1.6 mg/dL — ABNORMAL HIGH (ref 0.3–1.2)
Total Protein: 5.7 g/dL — ABNORMAL LOW (ref 6.5–8.1)

## 2021-01-26 LAB — GLUCOSE, CAPILLARY
Glucose-Capillary: 255 mg/dL — ABNORMAL HIGH (ref 70–99)
Glucose-Capillary: 212 mg/dL — ABNORMAL HIGH (ref 70–99)
Glucose-Capillary: 197 mg/dL — ABNORMAL HIGH (ref 70–99)
Glucose-Capillary: 221 mg/dL — ABNORMAL HIGH (ref 70–99)
Glucose-Capillary: 258 mg/dL — ABNORMAL HIGH (ref 70–99)

## 2021-01-26 LAB — PHOSPHORUS: Phosphorus: 3.5 mg/dL (ref 2.5–4.6)

## 2021-01-26 LAB — MAGNESIUM: Magnesium: 2.2 mg/dL (ref 1.7–2.4)

## 2021-01-26 MED ORDER — INSULIN GLARGINE-YFGN 100 UNIT/ML ~~LOC~~ SOLN
12.0000 [IU] | Freq: Every day | SUBCUTANEOUS | Status: DC
  Administered 2021-01-26 – 2021-01-27 (×2): 12 [IU] via SUBCUTANEOUS
  Filled 2021-01-26 (×2): qty 0.12

## 2021-01-26 MED ORDER — SODIUM CHLORIDE 0.45 % IV SOLN: INTRAVENOUS | Status: AC

## 2021-01-26 MED ORDER — LORAZEPAM 2 MG/ML IJ SOLN
0.5000 mg | Freq: Every day | INTRAMUSCULAR | Status: DC | PRN
  Administered 2021-01-26: 0.5 mg via INTRAVENOUS
  Filled 2021-01-26: qty 1

## 2021-01-26 MED ORDER — PROSOURCE TF PO LIQD
45.0000 mL | Freq: Two times a day (BID) | ORAL | Status: DC
  Administered 2021-01-26 – 2021-01-29 (×8): 45 mL
  Filled 2021-01-26 (×8): qty 45

## 2021-01-26 MED ORDER — OSMOLITE 1.5 CAL PO LIQD
1000.0000 mL | ORAL | Status: DC
  Administered 2021-01-26 – 2021-01-27 (×2): 1000 mL
  Administered 2021-01-27: 50 mL
  Administered 2021-01-28: 1000 mL
  Filled 2021-01-26 (×3): qty 1000

## 2021-01-26 MED ORDER — FREE WATER
150.0000 mL | Status: DC
  Administered 2021-01-26 – 2021-01-27 (×4): 150 mL

## 2021-01-26 NOTE — Progress Notes (Signed)
PROGRESS NOTE    Michael Wolfe  ERX:[id number] DOB: 29-Sep-1931 DOA: 01/18/2021 PCP: Rinaldo Cloud, MD   Chief Complaint  Patient presents with   Shortness of Breath   Chest Pain    Brief Narrative:   Michael Wolfe is a 88 y.o. male with medical history significant of AAA, HTN, CAD with stable angina, aortic stenosis, CKD stage II, HLD, IIDM, brought in by family member for increasing shortness of breath, chest pain/epigastric pain.   Patient is confused, unable to provide any history, most history provided by patient son over the phone.  Patient lives with his wife, with son lives 1 hour away.  Last 2 days, patient has had increasing shortness of breath along with chest/epigastric pain, has had poor oral intake for the last 2 days " anything he ate will came straight up right away".  No fever or chills, no diarrhea.  Wife also experienced similar symptoms since yesterday.  Son also reported at baseline patient has had increasing exertional shortness of breath but never complained about chest pains until 2 days ago.  Patient never had COVID vaccination.   Assessment & Plan:   Principal Problem:   COVID Active Problems:   COVID-19 virus infection   Encephalopathy due to COVID-19 virus     COVID-19 infection  -Patient with no hypoxia, but tenuous, with multiple comorbidities including CAD, CKD, diabetes mellitus . -continue With remdesivir . -Continue with short course of p.o. steroids . -Patient is unvaccinated, appears to be with significant COVID-19 encephalopathy.  AKI on CKD IIIB -From volume depletion and dehydration, hold lisinopril and continue with IV fluids.  - fattening remains elevated, continue with IV fluids , no signs of volume overload.   -CT abdomen pelvis showed no urinary obstructions.  -Avoid low blood pressure, started midodrine with holding parameters.  -Continue with sodium bicarb, will DC when CO2 24. -We will start free water via tube once  confirmed -Changes fluid to half-normal saline given sodium slowly trending up  Hypernatremia -Already on LR, to half-normal saline as sodium elevated to 149 today, will start on free water via tube as well.  Metabolic encephalopathy acute -This is most likely due to delirium, versus COVID-19 -CT head/MRI significant for remote stroke, but no acute ischemic event -TSH within normal limit, B12 on the lower side at 348, will start on some supplements ACS ruled out, echo is reassuring -Encephalopathy remains significant problem, this is most likely combination between COVID-19 encephalopathy and hospital delirium, prolonged QTC limiting antipsychotic use including Haldol and Seroquel. -Patient remains with severe encephalopathy, this appears to be most likely due to COVID as it is more obtundent's and apathy than actual delirium. -Started on tube feed via core track as unsafe for him to swallow and he is unmotivated to eat.    Elevated troponins -Demand ischemia from COVID and AKI, reassuring 35 > 36 > 41 -no regional wall motion abnormalities on 2D echo   HTN -Blood pressure is labile, so I have started on midodrine, I will hold his antihypertensive regimen, but will keep him on as needed hydralazine.  Marland Kitchen   IIDM -Hold p.o. diabetic medication, started on insulin sliding scale   CAD -Continue Plavix and statin.  Failure to thrive -Patient remains with very poor oral intake mainly due to encephalopathy, core track to be inserted today and to be started on tube feed..   DVT prophylaxis: Heparin Code Status: Full Family Communication: Discussed with both sons at bedside yesterday, and with the  third son by phone as well on 1/1. Disposition:   Status is: Inpatient  Remains inpatient appropriate because:        Consultants:  None    Subjective:  No significant events overnight as discussed with staff.  Patient himself is unable to provide any  complaints.   Objective: Vitals:   01/26/21 0600 01/26/21 0748 01/26/21 0814 01/26/21 1207  BP: 135/69 (!) 143/67 131/72 (!) 147/77  Pulse:  (!) 121 98 (!) 111  Resp:  (!) 25 (!) 21 20  Temp:   97.9 F (36.6 C) 99.1 F (37.3 C)  TempSrc:   Axillary Axillary  SpO2:  94% 94% 95%  Weight:      Height:        Intake/Output Summary (Last 24 hours) at 01/26/2021 1313 Last data filed at 01/26/2021 1100 Gross per 24 hour  Intake 2148.28 ml  Output 1800 ml  Net 348.28 ml   Filed Weights   01/22/21 0035  Weight: 85.8 kg    Examination:  Patient is ill-appearing, frail, lethargic open eyes to loud verbal stimuli Symmetrical Chest wall movement, Good air movement bilaterally, CTAB RRR,No Gallops,Rubs or new Murmurs, No Parasternal Heave +ve B.Sounds, Abd Soft, No tenderness, No rebound - guarding or rigidity. No Cyanosis, Clubbing or edema, No new Rash or bruise       Data Reviewed: I have personally reviewed following labs and imaging studies  CBC: Recent Labs  Lab 01/22/21 0131 01/23/21 0106 01/24/21 0117 01/25/21 0117 01/26/21 0249  WBC 4.6 8.1 9.9 12.9* 14.5*  NEUTROABS 3.7 6.9 8.6* 11.7* 12.0*  HGB 15.0 16.6 17.0 16.9 16.3  HCT 44.7 47.8 49.5 49.8 48.6  MCV 92.4 92.6 92.9 93.6 95.5  PLT 98* 120* 154 148* 145*    Basic Metabolic Panel: Recent Labs  Lab 01/22/21 0131 01/23/21 0106 01/24/21 0117 01/25/21 0117 01/26/21 0249  NA 138 140 142 146* 149*  K 4.0 5.5* 4.4 4.2 4.8  CL 111 114* 113* 117* 116*  CO2 17* 16* 18* 19* 22  GLUCOSE 174* 198* 232* 249* 226*  BUN 32* 33* 46* 56* 53*  CREATININE 1.82* 1.48* 1.91* 1.93* 1.91*  CALCIUM 8.2* 8.4* 8.6* 8.7* 8.6*  MG 1.9 2.0 2.4 2.3 2.2  PHOS 2.2* 2.6 2.9 2.7 3.5    GFR: Estimated Creatinine Clearance: 28.5 mL/min (A) (by C-G formula based on SCr of 1.91 mg/dL (H)).  Liver Function Tests: Recent Labs  Lab 01/22/21 0131 01/23/21 0106 01/24/21 0117 01/25/21 0117 01/26/21 0249  AST 37 53* 65* 60* 42*   ALT 40 40 51* 56* 40  ALKPHOS 71 70 82 82 71  BILITOT 1.0 1.7* 0.7 0.9 1.6*  PROT 6.2* 6.4* 6.3* 6.2* 5.7*  ALBUMIN 3.6 3.4* 3.4* 3.4* 3.2*    CBG: Recent Labs  Lab 01/25/21 1213 01/25/21 1656 01/25/21 2233 01/26/21 0745 01/26/21 1205  GLUCAP 284* 186* 174* 255* 212*     Recent Results (from the past 240 hour(s))  Resp Panel by RT-PCR (Flu A&B, Covid) Nasopharyngeal Swab     Status: Abnormal   Collection Time: 01/24/2021  4:50 PM   Specimen: Nasopharyngeal Swab; Nasopharyngeal(NP) swabs in vial transport medium  Result Value Ref Range Status   SARS Coronavirus 2 by RT PCR POSITIVE (A) NEGATIVE Final    Comment: (NOTE) SARS-CoV-2 target nucleic acids are DETECTED.  The SARS-CoV-2 RNA is generally detectable in upper respiratory specimens during the acute phase of infection. Positive results are indicative of the presence of the identified virus,  but do not rule out bacterial infection or co-infection with other pathogens not detected by the test. Clinical correlation with patient history and other diagnostic information is necessary to determine patient infection status. The expected result is Negative.  Fact Sheet for Patients: EntrepreneurPulse.com.au  Fact Sheet for Healthcare Providers: IncredibleEmployment.be  This test is not yet approved or cleared by the [place] FDA and  has been authorized for detection and/or diagnosis of SARS-CoV-2 by FDA under an Emergency Use Authorization (EUA).  This EUA will remain in effect (meaning this test can be used) for the duration of  the COVID-19 declaration under Section 564(b)(1) of the A ct, 21 U.S.C. section 360bbb-3(b)(1), unless the authorization is terminated or revoked sooner.     Influenza A by PCR NEGATIVE NEGATIVE Final   Influenza B by PCR NEGATIVE NEGATIVE Final    Comment: (NOTE) The Xpert Xpress SARS-CoV-2/FLU/RSV plus assay is intended as an aid in the diagnosis of  influenza from Nasopharyngeal swab specimens and should not be used as a sole basis for treatment. Nasal washings and aspirates are unacceptable for Xpert Xpress SARS-CoV-2/FLU/RSV testing.  Fact Sheet for Patients: EntrepreneurPulse.com.au  Fact Sheet for Healthcare Providers: IncredibleEmployment.be  This test is not yet approved or cleared by the [place] FDA and has been authorized for detection and/or diagnosis of SARS-CoV-2 by FDA under an Emergency Use Authorization (EUA). This EUA will remain in effect (meaning this test can be used) for the duration of the COVID-19 declaration under Section 564(b)(1) of the Act, 21 U.S.C. section 360bbb-3(b)(1), unless the authorization is terminated or revoked.  Performed at [facility], [place] [address] [place], [place] [address]   Culture, blood (routine x 2)     Status: None (Preliminary result)   Collection Time: 01/22/21  5:04 AM   Specimen: BLOOD RIGHT HAND  Result Value Ref Range Status   Specimen Description BLOOD RIGHT HAND  Final   Special Requests   Final    BOTTLES DRAWN AEROBIC AND ANAEROBIC Blood Culture adequate volume   Culture   Final    NO GROWTH 4 DAYS Performed at [facility], [place] [address] [place], [place] [address]    Report Status PENDING  Incomplete  Culture, blood (routine x 2)     Status: None (Preliminary result)   Collection Time: 01/22/21  5:04 AM   Specimen: BLOOD RIGHT ARM  Result Value Ref Range Status   Specimen Description BLOOD RIGHT ARM  Final   Special Requests   Final    BOTTLES DRAWN AEROBIC AND ANAEROBIC Blood Culture adequate volume   Culture   Final    NO GROWTH 4 DAYS Performed at [facility], [place] [address] [place], [place] [address]    Report Status PENDING  Incomplete  Urine Culture     Status: Abnormal   Collection Time: 01/22/21  5:21 AM   Specimen: Urine, Clean Catch  Result Value Ref Range Status   Specimen  Description URINE, CLEAN CATCH  Final   Special Requests   Final    NONE Performed at [facility], [place] [address] [place], [place] [address]    Culture MULTIPLE SPECIES PRESENT, SUGGEST RECOLLECTION (A)  Final   Report Status 01/23/2021 FINAL  Final         Radiology Studies: DG Abd Portable 1V  Result Date: 01/26/2021 CLINICAL DATA:  Assess feeding tube placement. EXAM: PORTABLE ABDOMEN - 1 VIEW COMPARISON:  12/30/2020 FINDINGS: Feeding tube  is identified with tip projecting over the right upper quadrant of the abdomen in the expected location of the antropyloric junction. Bowel gas pattern appears nonobstructed with gas noted in the colon. Aortic stent graft is noted. IMPRESSION: Feeding tube tip projects over the antropyloric junction. Electronically Signed   By: Kerby Moors M.D.   On: 01/26/2021 11:43        Scheduled Meds:  amLODipine  5 mg Oral Daily   clopidogrel  75 mg Oral Daily   dexamethasone  6 mg Oral Q24H   feeding supplement  237 mL Oral TID BM   heparin  5,000 Units Subcutaneous Q8H   insulin aspart  0-9 Units Subcutaneous TID WC   insulin glargine-yfgn  12 Units Subcutaneous Daily   midodrine  5 mg Oral TID WC   rosuvastatin  20 mg Oral Daily   sodium bicarbonate  1,300 mg Oral BID   vitamin B-12  250 mcg Oral Daily   Continuous Infusions:  sodium chloride 100 mL/hr at 01/26/21 0817     LOS: 4 days       Phillips Climes, MD [facility]   To contact the attending provider between 7A-7P or the covering provider during after hours 7P-7A, please log into the web site [email or web address] and access using universal [place] password for that web site. If you do not have the password, please call the hospital operator.  01/26/2021, 1:13 PM   Patient ID: Michael Wolfe, male   DOB: Mar 02, 1931, 88 y.o.   MRN: [id number] Patient ID: Michael Wolfe, male   DOB: August 09, 1931, 88 y.o.   MRN: [id number] Patient ID: Michael Wolfe, male   DOB:  Aug 07, 1931, 88 y.o.   MRN: [id number] Patient remains encephalopathic, confused patient ID: Michael Wolfe, male   DOB: April 17, 1931, 88 y.o.   MRN: [id number] Patient ID: Michael Wolfe, male   DOB: 05-10-1931, 88 y.o.   MRN: [id number]

## 2021-01-26 NOTE — Progress Notes (Signed)
Inpatient Diabetes Program Recommendations  AACE/ADA: New Consensus Statement on Inpatient Glycemic Control (2015)  Target Ranges:  Prepandial:   less than 140 mg/dL      Peak postprandial:   less than 180 mg/dL (1-2 hours)      Critically ill patients:  140 - 180 mg/dL   Lab Results  Component Value Date   GLUCAP 255 (H) 01/26/2021   HGBA1C 8.2 (H) 01/22/2021    Review of Glycemic Control  Latest Reference Range & Units 01/25/21 08:16 01/25/21 12:13 01/25/21 16:56 01/25/21 22:33 01/26/21 07:45  Glucose-Capillary 70 - 99 mg/dL 182 (H) 993 (H) 716 (H) 174 (H) 255 (H)  (H): Data is abnormally high  Diabetes history: DM2 Outpatient Diabetes medications: Amaryl 4 mg QD, Metformin 500 mg TID Current orders for Inpatient glycemic control: Novolog 0-9 TID, Decadron 6 mg QD  Inpatient Diabetes Program Recommendations:     Semglee 12 units QD (0.15 units/kg)  Will continue to follow while inpatient.  Thank you, Dulce Sellar, MSN, RN Diabetes Coordinator Inpatient Diabetes Program [phone] (team pager from 8a-5p)

## 2021-01-26 NOTE — Procedures (Signed)
Cortrak ? ?Tube Type:  Cortrak - 43 inches ?Tube Location:  Left nare ?Initial Placement:  Stomach ?Secured by: Bridle ?Technique Used to Measure Tube Placement:  Marking at nare/corner of mouth ?Cortrak Secured At:  75 cm ? ?Cortrak Tube Team Note: ? ?Consult received to place a Cortrak feeding tube.  ? ?X-ray is required, abdominal x-ray has been ordered by the Cortrak team. Please confirm tube placement before using the Cortrak tube.  ? ?If the tube becomes dislodged please keep the tube and contact the Cortrak team at [email or web address] (password [email or web address]) for replacement.  ?If after hours and replacement cannot be delayed, place a NG tube and confirm placement with an abdominal x-ray.  ? ? ?Trestan Vahle MS, RD, LDN ?Please refer to AMION for RD and/or RD on-call/weekend/after hours pager ? ? ?

## 2021-01-26 NOTE — Progress Notes (Signed)
PT Cancellation Note  Patient Details Name: Michael Wolfe MRN: [id number] DOB: 07-30-1931   Cancelled Treatment:    Reason Eval/Treat Not Completed: Fatigue/lethargy limiting ability to participate  Attempted session with pt very lethargic and not able to attend to follow commands. Resting HR noted to be 120 bpm.    Arby Barrette, PT Acute Rehabilitation Services  Pager [phone] Office [phone]    Rexanne Mano 01/26/2021, 12:50 PM

## 2021-01-26 NOTE — Progress Notes (Addendum)
Initial Nutrition Assessment  DOCUMENTATION CODES:   Severe malnutrition in context of acute illness/injury  INTERVENTION:  - Initiate tube feeding via Cortrak: Osmolite 1.5 at 20 ml/h and advance by 42ml every 8 hours until reaching a goal rate of 3ml/hr (1440 ml per day) Prosource TF 45 ml BID  Provides 2240 kcal, 112 gm protein, 1181 ml free water daily  - Free water flushes every 4 hours  - Monitor magnesium, potassium, and phosphorus BID for at least 3 days, MD to replete as needed, as pt is at risk for refeeding syndrome given poor oral intake for several days.  - Discontinue Ensure Enlive  NUTRITION DIAGNOSIS:   Severe Malnutrition related to acute illness (COVID+) as evidenced by severe fat depletion, severe muscle depletion.  GOAL:   Patient will meet greater than or equal to 90% of their needs  MONITOR:   Labs, Weight trends, TF tolerance  REASON FOR ASSESSMENT:   Consult Enteral/tube feeding initiation and management  ASSESSMENT:   Pt admitted with SOB, chest pain/epigastric pain secondary to COVID+. PMH includes AAA, HTN, CAD, aortic stenosis, CKD II, HLD, and T2DM  Pt's son at bedside during visit and provided most history. Pt noted to be with significant COVID-19 encephalopathy. His son states that since becoming symptomatic, he has not eaten anything and has been refusing food and beverages. PTA his usual PO intake was consistent and he typically ate every 2 hours. He enjoys "country cooking" and eating out often.   Pt with Cortrak in place. Cortrak xray confirmed placement in the stomach.  Per review of chart, pt with steady, slow weight loss since 2020. Will continue to monitor. Admit weight: 85.8 kg  Pt meets criteria for acute malnutrition secondary to COVID+, SOB and chest pain. However, suspect underlying chronic malnutrition given multiple chronic illnesses.  Medications: decadron, SSI, sodium bicarbonate, Vitamin B-12  Labs: sodium 149,  BUN 53, Cr 1.91, AST 42, HgbA1c 8.2%,  CBG's 174-289 x 24 hours  UOP: 1.4L x24 hours I/O's: -1L since admission  NUTRITION - FOCUSED PHYSICAL EXAM:  Flowsheet Row Most Recent Value  Orbital Region Severe depletion  Upper Arm Region Severe depletion  Thoracic and Lumbar Region Moderate depletion  Buccal Region Moderate depletion  Temple Region Moderate depletion  Clavicle Bone Region Mild depletion  Clavicle and Acromion Bone Region Moderate depletion  Scapular Bone Region Mild depletion  Dorsal Hand Moderate depletion  Patellar Region Severe depletion  Anterior Thigh Region Severe depletion  Posterior Calf Region Severe depletion  Edema (RD Assessment) None  Hair Reviewed  Eyes Reviewed  Mouth Reviewed  Skin Reviewed  Nails Reviewed       Diet Order:   Diet Order             DIET DYS 2 Room service appropriate? No; Fluid consistency: Thin  Diet effective now                   EDUCATION NEEDS:   Not appropriate for education at this time  Skin:  Skin Assessment: Reviewed RN Assessment  Last BM:  PTA  Height:   Ht Readings from Last 1 Encounters:  01/22/21 5\' 7"  (1.702 m)    Weight:   Wt Readings from Last 1 Encounters:  01/22/21 85.8 kg   BMI:  Body mass index is 29.63 kg/m.  Estimated Nutritional Needs:   Kcal:  2100-2300  Protein:  105-115  Fluid:  >/=2.1L  01/24/21, RDN, LDN Clinical Nutrition

## 2021-01-27 ENCOUNTER — Inpatient Hospital Stay (HOSPITAL_COMMUNITY): Payer: Medicare Other

## 2021-01-27 DIAGNOSIS — E43 Unspecified severe protein-calorie malnutrition: Secondary | ICD-10-CM

## 2021-01-27 DIAGNOSIS — U071 COVID-19: Principal | ICD-10-CM

## 2021-01-27 LAB — BLOOD GAS, ARTERIAL
Acid-Base Excess: 0.8 mmol/L (ref 0.0–2.0)
Bicarbonate: 24.1 mmol/L (ref 20.0–28.0)
FIO2: 36
O2 Saturation: 92.3 %
Patient temperature: 37
pCO2 arterial: 33.2 mmHg (ref 32.0–48.0)
pH, Arterial: 7.474 — ABNORMAL HIGH (ref 7.350–7.450)
pO2, Arterial: 62.1 mmHg — ABNORMAL LOW (ref 83.0–108.0)

## 2021-01-27 LAB — POCT I-STAT 7, (LYTES, BLD GAS, ICA,H+H)
Acid-Base Excess: 0 mmol/L (ref 0.0–2.0)
Bicarbonate: 23.3 mmol/L (ref 20.0–28.0)
Calcium, Ion: 1.21 mmol/L (ref 1.15–1.40)
HCT: 39 % (ref 39.0–52.0)
Hemoglobin: 13.3 g/dL (ref 13.0–17.0)
O2 Saturation: 96 %
Patient temperature: 100.3
Potassium: 4.3 mmol/L (ref 3.5–5.1)
Sodium: 148 mmol/L — ABNORMAL HIGH (ref 135–145)
TCO2: 24 mmol/L (ref 22–32)
pCO2 arterial: 36.5 mmHg (ref 32.0–48.0)
pH, Arterial: 7.418 (ref 7.350–7.450)
pO2, Arterial: 83 mmHg (ref 83.0–108.0)

## 2021-01-27 LAB — MRSA NEXT GEN BY PCR, NASAL: MRSA by PCR Next Gen: NOT DETECTED

## 2021-01-27 LAB — GLUCOSE, CAPILLARY
Glucose-Capillary: 424 mg/dL — ABNORMAL HIGH (ref 70–99)
Glucose-Capillary: 319 mg/dL — ABNORMAL HIGH (ref 70–99)
Glucose-Capillary: 272 mg/dL — ABNORMAL HIGH (ref 70–99)
Glucose-Capillary: 280 mg/dL — ABNORMAL HIGH (ref 70–99)
Glucose-Capillary: 269 mg/dL — ABNORMAL HIGH (ref 70–99)
Glucose-Capillary: 307 mg/dL — ABNORMAL HIGH (ref 70–99)

## 2021-01-27 LAB — CULTURE, BLOOD (ROUTINE X 2)
Culture: NO GROWTH
Culture: NO GROWTH
Special Requests: ADEQUATE
Special Requests: ADEQUATE

## 2021-01-27 LAB — COMPREHENSIVE METABOLIC PANEL
ALT: 35 U/L (ref 0–44)
AST: 33 U/L (ref 15–41)
Albumin: 2.9 g/dL — ABNORMAL LOW (ref 3.5–5.0)
Alkaline Phosphatase: 66 U/L (ref 38–126)
Anion gap: 8 (ref 5–15)
BUN: 52 mg/dL — ABNORMAL HIGH (ref 8–23)
CO2: 24 mmol/L (ref 22–32)
Calcium: 8.4 mg/dL — ABNORMAL LOW (ref 8.9–10.3)
Chloride: 116 mmol/L — ABNORMAL HIGH (ref 98–111)
Creatinine, Ser: 1.75 mg/dL — ABNORMAL HIGH (ref 0.61–1.24)
GFR, Estimated: 37 mL/min — ABNORMAL LOW (ref >60)
Glucose, Bld: 311 mg/dL — ABNORMAL HIGH (ref 70–99)
Potassium: 4.6 mmol/L (ref 3.5–5.1)
Sodium: 148 mmol/L — ABNORMAL HIGH (ref 135–145)
Total Bilirubin: 1.2 mg/dL (ref 0.3–1.2)
Total Protein: 5.2 g/dL — ABNORMAL LOW (ref 6.5–8.1)

## 2021-01-27 LAB — CBC
HCT: 47.5 % (ref 39.0–52.0)
Hemoglobin: 15.1 g/dL (ref 13.0–17.0)
MCH: 30.9 pg (ref 26.0–34.0)
MCHC: 31.8 g/dL (ref 30.0–36.0)
MCV: 97.1 fL (ref 80.0–100.0)
Platelets: 120 10*3/uL — ABNORMAL LOW (ref 150–400)
RBC: 4.89 MIL/uL (ref 4.22–5.81)
RDW: 13.9 % (ref 11.5–15.5)
WBC: 11.2 10*3/uL — ABNORMAL HIGH (ref 4.0–10.5)
nRBC: 0 % (ref 0.0–0.2)

## 2021-01-27 LAB — LACTATE DEHYDROGENASE: LDH: 386 U/L — ABNORMAL HIGH (ref 98–192)

## 2021-01-27 LAB — MAGNESIUM: Magnesium: 2.3 mg/dL (ref 1.7–2.4)

## 2021-01-27 LAB — PROCALCITONIN
Procalcitonin: 0.19 ng/mL
Procalcitonin: 0.33 ng/mL

## 2021-01-27 LAB — PHOSPHORUS: Phosphorus: 3.3 mg/dL (ref 2.5–4.6)

## 2021-01-27 LAB — BRAIN NATRIURETIC PEPTIDE: B Natriuretic Peptide: 144.4 pg/mL — ABNORMAL HIGH (ref 0.0–100.0)

## 2021-01-27 LAB — FOLATE: Folate: 15.3 ng/mL (ref >5.9)

## 2021-01-27 MED ORDER — ORAL CARE MOUTH RINSE
15.0000 mL | Freq: Two times a day (BID) | OROMUCOSAL | Status: DC
  Administered 2021-01-27 – 2021-01-29 (×6): 15 mL via OROMUCOSAL

## 2021-01-27 MED ORDER — INSULIN GLARGINE-YFGN 100 UNIT/ML ~~LOC~~ SOLN
12.0000 [IU] | Freq: Two times a day (BID) | SUBCUTANEOUS | Status: DC
  Administered 2021-01-27 – 2021-01-28 (×3): 12 [IU] via SUBCUTANEOUS
  Filled 2021-01-27 (×6): qty 0.12

## 2021-01-27 MED ORDER — INSULIN ASPART 100 UNIT/ML IJ SOLN: 0.0000 [IU] | Freq: Every day | INTRAMUSCULAR | Status: DC

## 2021-01-27 MED ORDER — INSULIN ASPART 100 UNIT/ML IJ SOLN
5.0000 [IU] | Freq: Once | INTRAMUSCULAR | Status: AC
  Administered 2021-01-27: 5 [IU] via SUBCUTANEOUS

## 2021-01-27 MED ORDER — VALPROATE SODIUM 100 MG/ML IV SOLN
250.0000 mg | Freq: Two times a day (BID) | INTRAVENOUS | Status: DC
  Administered 2021-01-27 – 2021-01-28 (×2): 250 mg via INTRAVENOUS
  Filled 2021-01-27 (×3): qty 2.5

## 2021-01-27 MED ORDER — CHLORHEXIDINE GLUCONATE 0.12 % MT SOLN
15.0000 mL | Freq: Two times a day (BID) | OROMUCOSAL | Status: DC
  Administered 2021-01-27 – 2021-01-29 (×6): 15 mL via OROMUCOSAL
  Filled 2021-01-27 (×5): qty 15

## 2021-01-27 MED ORDER — CHLORHEXIDINE GLUCONATE CLOTH 2 % EX PADS
6.0000 | MEDICATED_PAD | Freq: Every day | CUTANEOUS | Status: DC
  Administered 2021-01-27 – 2021-01-29 (×3): 6 via TOPICAL

## 2021-01-27 MED ORDER — AZITHROMYCIN 500 MG PO TABS
500.0000 mg | ORAL_TABLET | Freq: Every day | ORAL | Status: DC
  Administered 2021-01-27: 500 mg via ORAL
  Filled 2021-01-27: qty 1

## 2021-01-27 MED ORDER — SODIUM CHLORIDE 0.9 % IV BOLUS
500.0000 mL | Freq: Once | INTRAVENOUS | Status: AC
  Administered 2021-01-27: 500 mL via INTRAVENOUS

## 2021-01-27 MED ORDER — SODIUM CHLORIDE 0.9 % IV SOLN
3.0000 g | Freq: Four times a day (QID) | INTRAVENOUS | Status: DC
  Administered 2021-01-27: 3 g via INTRAVENOUS
  Filled 2021-01-27: qty 8

## 2021-01-27 MED ORDER — AZITHROMYCIN 500 MG PO TABS: 250.0000 mg | ORAL_TABLET | Freq: Every day | ORAL | Status: DC

## 2021-01-27 MED ORDER — DEXTROSE 5 % IV BOLUS
1000.0000 mL | Freq: Once | INTRAVENOUS | Status: AC
  Administered 2021-01-27: 1000 mL via INTRAVENOUS

## 2021-01-27 MED ORDER — FREE WATER
250.0000 mL | Status: DC
  Administered 2021-01-27 – 2021-01-28 (×6): 250 mL

## 2021-01-27 MED ORDER — SODIUM CHLORIDE 0.9 % IV SOLN
2.0000 g | INTRAVENOUS | Status: DC
  Administered 2021-01-27 – 2021-01-29 (×3): 2 g via INTRAVENOUS
  Filled 2021-01-27 (×3): qty 20

## 2021-01-27 MED ORDER — INSULIN ASPART 100 UNIT/ML IJ SOLN
0.0000 [IU] | INTRAMUSCULAR | Status: DC
  Administered 2021-01-27: 8 [IU] via SUBCUTANEOUS
  Administered 2021-01-27: 11 [IU] via SUBCUTANEOUS
  Administered 2021-01-27 (×2): 8 [IU] via SUBCUTANEOUS
  Administered 2021-01-27 – 2021-01-28 (×2): 11 [IU] via SUBCUTANEOUS
  Administered 2021-01-28: 5 [IU] via SUBCUTANEOUS

## 2021-01-27 NOTE — Progress Notes (Signed)
Patient requires two tech for set up due to movement and agitation. Will hold off til morning to get a better read. Nurse conferred.

## 2021-01-27 NOTE — Plan of Care (Signed)
  Problem: Safety: Goal: Non-violent Restraint(s) Outcome: Progressing   

## 2021-01-27 NOTE — Progress Notes (Signed)
°   01/27/21 0402  Assess: MEWS Score  Temp 99.6 F (37.6 C)  BP 91/77  Pulse Rate (!) 101  ECG Heart Rate (!) 101  Resp (!) 37  SpO2 94 %  Assess: MEWS Score  MEWS Temp 0  MEWS Systolic 1  MEWS Pulse 1  MEWS RR 3  MEWS LOC 0  MEWS Score 5  MEWS Score Color Red  Assess: if the MEWS score is Yellow or Red  Were vital signs taken at a resting state? Yes  Focused Assessment Change from prior assessment (see assessment flowsheet)  Early Detection of Sepsis Score *See Row Information* Medium  MEWS guidelines implemented *See Row Information* Yes  Treat  Pain Scale Faces  Pain Score 0  Escalate  MEWS: Escalate Red: discuss with charge nurse/RN and provider, consider discussing with RRT  Notify: Charge Nurse/RN  Name of Charge Nurse/RN Notified Vanita Panda. RN  Date Charge Nurse/RN Notified 01/27/21  Time Charge Nurse/RN Notified 0402  Notify: Provider  Provider Name/Title J.  Date Provider Notified 01/27/21  Notification Type Page  Notification Reason Change in status  Provider response Other (Comment)  Date of Provider Response 01/27/21  Notify: Rapid Response  Name of Rapid Response RN Notified David RN  Date Rapid Response Notified 01/27/21  Time Rapid Response Notified 0404

## 2021-01-27 NOTE — Progress Notes (Signed)
°   01/27/21 0402  Assess: MEWS Score  Temp 99.6 F (37.6 C)  BP 91/77  Pulse Rate (!) 101  ECG Heart Rate (!) 101  Resp (!) 37  SpO2 94 %  Assess: MEWS Score  MEWS Temp 0  MEWS Systolic 1  MEWS Pulse 1  MEWS RR 3  MEWS LOC 0  MEWS Score 5  MEWS Score Color Red  Assess: if the MEWS score is Yellow or Red  Were vital signs taken at a resting state? Yes  Focused Assessment Change from prior assessment (see assessment flowsheet)  Early Detection of Sepsis Score *See Row Information* Medium  MEWS guidelines implemented *See Row Information* Yes  Treat  Pain Scale Faces  Pain Score 0  Escalate  MEWS: Escalate Red: discuss with charge nurse/RN and provider, consider discussing with RRT  Notify: Charge Nurse/RN  Name of Charge Nurse/RN Notified Vanita Panda. RN  Date Charge Nurse/RN Notified 01/27/21  Time Charge Nurse/RN Notified 0402  Notify: Provider  Provider Name/Title J.  Date Provider Notified 01/27/21  Notification Type Page  Notification Reason Change in status  Provider response See new orders  Date of Provider Response 01/27/21  Notify: Rapid Response  Name of Rapid Response RN Notified Onalee Hua RN  Date Rapid Response Notified 01/27/21  Time Rapid Response Notified 0404

## 2021-01-27 NOTE — Progress Notes (Signed)
Pharmacy Antibiotic Note  Michael Wolfe is a 88 y.o. male admitted on January 22, 2021 with  aspiration pneumonia .  Pharmacy has been consulted for Unasyn dosing.  SCr elevated but trending down at 1.75. Good uop charted.  WBC trending down at 11.2. PCT 0.19.  Tmax 100.3. Tachycardic.   Plan: Unasyn 3 grams IV every 6 hours. Renal function is borderline - if SCr rises or UOP declines, would reduce dosing. Monitor renal function, culture results, and clinical status.   Height: 5\' 7"  (170.2 cm) Weight: 83.3 kg (183 lb 10.3 oz) IBW/kg (Calculated) : 66.1  Temp (24hrs), Avg:99.5 F (37.5 C), Min:98.9 F (37.2 C), Max:100.3 F (37.9 C)  Recent Labs  Lab 01/22/21 0504 01/23/21 0106 01/24/21 0117 01/25/21 0117 01/26/21 0249 01/27/21 0100  WBC  --  8.1 9.9 12.9* 14.5* 11.2*  CREATININE  --  1.48* 1.91* 1.93* 1.91* 1.75*  LATICACIDVEN 1.8  --   --   --   --   --     Estimated Creatinine Clearance: 30.7 mL/min (A) (by C-G formula based on SCr of 1.75 mg/dL (H)).    No Known Allergies  Antimicrobials this admission: Unasyn 1/3 >>  Dose adjustments this admission:   Microbiology results: 12/29 BCx: negative 12/29 UCx: multiple species, suggest recollection 12/28 COVID PCR positive   Thank you for allowing pharmacy to be a part of this patients care.  1/29, PharmD, BCPS, BCCCP Clinical Pharmacist Please refer to [facility] for [facility] Pharmacy numbers 01/27/2021 4:03 PM

## 2021-01-27 NOTE — Progress Notes (Addendum)
PROGRESS NOTE    Michael Wolfe  [id number] DOB: 11/19/1931 DOA: 01/17/2021 PCP: Charolette Forward, MD   Chief Complaint  Patient presents with   Shortness of Breath   Chest Pain    Brief Narrative:   Michael Wolfe is a 88 y.o. male with medical history significant of AAA, HTN, CAD with stable angina, aortic stenosis, CKD stage II, HLD, IIDM, brought in by family member for increasing shortness of breath, chest pain/epigastric pain.   Patient is confused, unable to provide any history, most history provided by patient son over the phone.  Patient lives with his wife, with son lives 1 hour away.  Last 2 days, patient has had increasing shortness of breath along with chest/epigastric pain, has had poor oral intake for the last 2 days " anything he ate will came straight up right away".  No fever or chills, no diarrhea.  Wife also experienced similar symptoms since yesterday.  Son also reported at baseline patient has had increasing exertional shortness of breath but never complained about chest pains until 2 days ago.  Patient never had COVID vaccination.   Assessment & Plan:   Principal Problem:   COVID Active Problems:   [id number] virus infection   Encephalopathy due to COVID-19 virus   Protein-calorie malnutrition, severe     COVID-19 infection /acute hypoxic respiratory failure -Patient with no hypoxia, but tenuous, with multiple comorbidities including CAD, CKD, diabetes mellitus . -He finished total 5 days of remdesivir. -He is on p.o. steroids -Patient is unvaccinated, appears to be with significant COVID-19 encephalopathy. -Appears to be with increased work of breathing, tachypneic in the 40s this afternoon earlier today with worsening right lung opacity, I will add IV Unasyn for empiric coverage. -We will obtain ABG. -PCCM consulted. -Monitor phosphorus level closely is at may cause respiratory distress as a part of refeeding syndrome, within normal limit this morning  at 3.3.  AKI on CKD IIIB -From volume depletion and dehydration, hold lisinopril and continue with IV fluids.  - fattening remains elevated, continue with IV fluids , no signs of volume overload.   -CT abdomen pelvis showed no urinary obstructions.  -Avoid low blood pressure, started midodrine with holding parameters. -We will discontinue sodium bicarb as bicarb has normalized today at 24.  -Continue with free water via PEG, will increase to 250 every 4 hours today.  Hypernatremia -Continue with free water via PEG.will increase to 250 every 4 hours today.  Metabolic encephalopathy acute -This is most likely due to delirium, versus COVID-19 -CT head/MRI significant for remote stroke, but no acute ischemic event -TSH within normal limit, B12 on the lower side at 348, will start on some supplements ACS ruled out, echo is reassuring -Encephalopathy remains significant problem, this is most likely combination between COVID-19 encephalopathy and hospital delirium, prolonged QTC limiting antipsychotic use including Haldol and Seroquel. -Patient remains with severe encephalopathy, this appears to be most likely due to COVID as it is more obtundent's and and lethargic than actual delirium. -Neurology consulted regarding further recommendations. -Tube feed started via core track.  Elevated troponins -Demand ischemia from COVID and AKI, reassuring 35 > 36 > 41 -no regional wall motion abnormalities on 2D echo   HTN -Blood pressure is labile, so I have started on midodrine, I will hold his antihypertensive regimen, but will keep him on as needed hydralazine.  Marland Kitchen   IIDM -Hold p.o. diabetic medication, started on insulin sliding scale   CAD -Continue Plavix and statin.  Failure to thrive/severe protein calorie malnutrition -Patient remains with very poor oral intake mainly due to encephalopathy, core track to be inserted and he started on tube feed. -Monitor closely for refeeding syndrome, check  phosphorus level daily.  DVT prophylaxis: Heparin Code Status: Full Family Communication: discussed with son at wife at bedside today. Disposition:   Status is: Inpatient  Remains inpatient appropriate because:        Consultants:  None    Subjective:  No significant events overnight as discussed with staff, he was started on tube feed yesterday, patient unable to provide any complaints.  .   Objective: Vitals:   01/27/21 0600 01/27/21 0648 01/27/21 0821 01/27/21 1204  BP: (!) 109/48 (!) 130/44 (!) 122/39 (!) 127/54  Pulse: 99 96 99 (!) 108  Resp: (!) 35 (!) 30 (!) 37 (!) 24  Temp: 99.5 F (37.5 C)  99 F (37.2 C) 100.3 F (37.9 C)  TempSrc: Axillary  Oral Axillary  SpO2: 96% 96% 94% 95%  Weight:      Height:        Intake/Output Summary (Last 24 hours) at 01/27/2021 1554 Last data filed at 01/27/2021 1100 Gross per 24 hour  Intake 2138.19 ml  Output 1250 ml  Net 888.19 ml   Filed Weights   01/22/21 0035 01/27/21 0402  Weight: 85.8 kg 83.3 kg    Examination:  Michael Wolfe, obtunded, does not follow any commands or answer any questions, ill-appearing, tachypneic  Symmetrical Chest wall movement, patient is tachypneic with use of accessory muscles RRR,No Gallops,Rubs or new Murmurs, No Parasternal Heave +ve B.Sounds, Abd Soft, No tenderness, No rebound - guarding or rigidity. No Cyanosis, Clubbing or edema, No new Rash or bruise       Data Reviewed: I have personally reviewed following labs and imaging studies  CBC: Recent Labs  Lab 01/22/21 0131 01/23/21 0106 01/24/21 0117 01/25/21 0117 01/26/21 0249 01/27/21 0100  WBC 4.6 8.1 9.9 12.9* 14.5* 11.2*  NEUTROABS 3.7 6.9 8.6* 11.7* 12.0*  --   HGB 15.0 16.6 17.0 16.9 16.3 15.1  HCT 44.7 47.8 49.5 49.8 48.6 47.5  MCV 92.4 92.6 92.9 93.6 95.5 97.1  PLT 98* 120* 154 148* 145* 120*    Basic Metabolic Panel: Recent Labs  Lab 01/23/21 0106 01/24/21 0117 01/25/21 0117 01/26/21 0249 01/27/21 0100  NA  140 142 146* 149* 148*  K 5.5* 4.4 4.2 4.8 4.6  CL 114* 113* 117* 116* 116*  CO2 16* 18* 19* 22 24  GLUCOSE 198* 232* 249* 226* 311*  BUN 33* 46* 56* 53* 52*  CREATININE 1.48* 1.91* 1.93* 1.91* 1.75*  CALCIUM 8.4* 8.6* 8.7* 8.6* 8.4*  MG 2.0 2.4 2.3 2.2 2.3  PHOS 2.6 2.9 2.7 3.5 3.3    GFR: Estimated Creatinine Clearance: 30.7 mL/min (A) (by C-G formula based on SCr of 1.75 mg/dL (H)).  Liver Function Tests: Recent Labs  Lab 01/23/21 0106 01/24/21 0117 01/25/21 0117 01/26/21 0249 01/27/21 0100  AST 53* 65* 60* 42* 33  ALT 40 51* 56* 40 35  ALKPHOS 70 82 82 71 66  BILITOT 1.7* 0.7 0.9 1.6* 1.2  PROT 6.4* 6.3* 6.2* 5.7* 5.2*  ALBUMIN 3.4* 3.4* 3.4* 3.2* 2.9*    CBG: Recent Labs  Lab 01/26/21 1930 01/26/21 2326 01/27/21 0401 01/27/21 0838 01/27/21 1207  GLUCAP 221* 258* 424* 319* 272*     Recent Results (from the past 240 hour(s))  Resp Panel by RT-PCR (Flu A&B, Covid) Nasopharyngeal Swab  Status: Abnormal   Collection Time: 12/28/2020  4:50 PM   Specimen: Nasopharyngeal Swab; Nasopharyngeal(NP) swabs in vial transport medium  Result Value Ref Range Status   SARS Coronavirus 2 by RT PCR POSITIVE (A) NEGATIVE Final    Comment: (NOTE) SARS-CoV-2 target nucleic acids are DETECTED.  The SARS-CoV-2 RNA is generally detectable in upper respiratory specimens during the acute phase of infection. Positive results are indicative of the presence of the identified virus, but do not rule out bacterial infection or co-infection with other pathogens not detected by the test. Clinical correlation with patient history and other diagnostic information is necessary to determine patient infection status. The expected result is Negative.  Fact Sheet for Patients: EntrepreneurPulse.com.au  Fact Sheet for Healthcare Providers: IncredibleEmployment.be  This test is not yet approved or cleared by the [place] FDA and  has been authorized  for detection and/or diagnosis of SARS-CoV-2 by FDA under an Emergency Use Authorization (EUA).  This EUA will remain in effect (meaning this test can be used) for the duration of  the COVID-19 declaration under Section 564(b)(1) of the A ct, 21 U.S.C. section 360bbb-3(b)(1), unless the authorization is terminated or revoked sooner.     Influenza A by PCR NEGATIVE NEGATIVE Final   Influenza B by PCR NEGATIVE NEGATIVE Final    Comment: (NOTE) The Xpert Xpress SARS-CoV-2/FLU/RSV plus assay is intended as an aid in the diagnosis of influenza from Nasopharyngeal swab specimens and should not be used as a sole basis for treatment. Nasal washings and aspirates are unacceptable for Xpert Xpress SARS-CoV-2/FLU/RSV testing.  Fact Sheet for Patients: EntrepreneurPulse.com.au  Fact Sheet for Healthcare Providers: IncredibleEmployment.be  This test is not yet approved or cleared by the [place] FDA and has been authorized for detection and/or diagnosis of SARS-CoV-2 by FDA under an Emergency Use Authorization (EUA). This EUA will remain in effect (meaning this test can be used) for the duration of the COVID-19 declaration under Section 564(b)(1) of the Act, 21 U.S.C. section 360bbb-3(b)(1), unless the authorization is terminated or revoked.  Performed at [facility], [place] [address], [place], [place] [address]   Culture, blood (routine x 2)     Status: None   Collection Time: 01/22/21  5:04 AM   Specimen: BLOOD RIGHT HAND  Result Value Ref Range Status   Specimen Description BLOOD RIGHT HAND  Final   Special Requests   Final    BOTTLES DRAWN AEROBIC AND ANAEROBIC Blood Culture adequate volume   Culture   Final    NO GROWTH 5 DAYS Performed at [facility], [place] [address] [place], [place] [address]    Report Status 01/27/2021 FINAL  Final  Culture, blood (routine x 2)     Status: None   Collection Time: 01/22/21  5:04 AM    Specimen: BLOOD RIGHT ARM  Result Value Ref Range Status   Specimen Description BLOOD RIGHT ARM  Final   Special Requests   Final    BOTTLES DRAWN AEROBIC AND ANAEROBIC Blood Culture adequate volume   Culture   Final    NO GROWTH 5 DAYS Performed at [facility], [place] [address], [place], [place] [address]    Report Status 01/27/2021 FINAL  Final  Urine Culture     Status: Abnormal   Collection Time: 01/22/21  5:21 AM   Specimen: Urine, Clean Catch  Result Value Ref Range Status   Specimen Description URINE, CLEAN CATCH  Final   Special Requests  Final    NONE Performed at [facility], [place] [address], [place], [place] [address]    Culture MULTIPLE SPECIES PRESENT, SUGGEST RECOLLECTION (A)  Final   Report Status 01/23/2021 FINAL  Final         Radiology Studies: DG CHEST PORT 1 VIEW  Result Date: 01/27/2021 CLINICAL DATA:  Hypoxia, COVID pneumonia EXAM: PORTABLE CHEST 1 VIEW COMPARISON:  01/13/2021 FINDINGS: Nasoenteric feeding tube extends in the upper abdomen beyond the margin of the examination. Lung volumes are small and pulmonary insufflation has diminished since prior examination. Multifocal pulmonary infiltrate has developed throughout the right lung, more severe at the right lung base, likely infectious or inflammatory in nature. No pneumothorax or pleural effusion. Cardiac size within normal limits. 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 No acute bone abnormality. IMPRESSION: Interval development of multifocal pulmonary infiltrate throughout the right lung, likely infectious or inflammatory. Progressive pulmonary hypoinflation. Electronically Signed   By: Fidela Salisbury M.D.   On: 01/27/2021 03:11   DG Abd Portable 1V  Result Date: 01/26/2021 CLINICAL DATA:  Assess feeding tube placement. EXAM: PORTABLE ABDOMEN - 1 VIEW COMPARISON:  01/13/2021 FINDINGS: Feeding tube is identified with tip projecting over the right upper quadrant of the abdomen  in the expected location of the antropyloric junction. Bowel gas pattern appears nonobstructed with gas noted in the colon. Aortic stent graft is noted. IMPRESSION: Feeding tube tip projects over the antropyloric junction. Electronically Signed   By: Kerby Moors M.D.   On: 01/26/2021 11:43        Scheduled Meds:  chlorhexidine  15 mL Mouth Rinse BID   clopidogrel  75 mg Oral Daily   dexamethasone  6 mg Oral Q24H   feeding supplement (PROSource TF)  45 mL Per Tube BID   free water  250 mL Per Tube Q4H   heparin  5,000 Units Subcutaneous Q8H   insulin aspart  0-15 Units Subcutaneous Q4H   insulin glargine-yfgn  12 Units Subcutaneous Daily   mouth rinse  15 mL Mouth Rinse q12n4p   midodrine  5 mg Oral TID WC   rosuvastatin  20 mg Oral Daily   vitamin B-12  250 mcg Oral Daily   Continuous Infusions:  feeding supplement (OSMOLITE 1.5 CAL) 40 mL/hr at 01/27/21 0923     LOS: 5 days       Phillips Climes, MD [facility]   To contact the attending provider between 7A-7P or the covering provider during after hours 7P-7A, please log into the web site [email or web address] and access using universal [place] password for that web site. If you do not have the password, please call the hospital operator.  01/27/2021, 3:54 PM   Patient ID: Michael Wolfe, male   DOB: [birth year], 87 y.o.   MRN: [id number] Patient ID: Michael Wolfe, male   DOB: 01/17/1932, 88 y.o.   MRN: [id number] Patient ID: Michael Wolfe, male   DOB: 1931-04-07, 88 y.o.   MRN: [id number] Patient remains encephalopathic, confused patient ID: Michael Wolfe, male   DOB: Jul 27, 1931, 88 y.o.   MRN: [id number] Patient ID: Michael Wolfe, male   DOB: 07/02/1931, 88 y.o.   MRN: [id number] Patient ID: Michael Wolfe, male   DOB: 07/16/1931, 88 y.o.   MRN: [id number]

## 2021-01-27 NOTE — Progress Notes (Signed)
eLink Physician-Brief Progress Note Patient Name: ABBIE JABLON DOB: [birth year] MRN: [id number]   Date of Service  01/27/2021  HPI/Events of Note  Patient appears altered r/o sub-acute delirium, brain imaging was unremarkable, hypoxia and hypercapnia are also considerations, no recent sedation.  eICU Interventions  Stat ABG and ammonia level ordered. Will consider intubation if he meets criteria.        [name] 01/27/2021, 10:29 PM

## 2021-01-27 NOTE — Progress Notes (Signed)
TRH night cross cover note:  Per my chart review, including review of most recent rounding hospitalist progress note, this is a 88 year old male who is admitted 5 days ago for acute hypoxic respiratory distress in the setting of COVID-19 infection, on remdesivir as well as systemic corticosteroids.   I was notified by RN that the patient has become progressively more hypoxic.  He had been maintaining O2 sats throughout the afternoon in the low 90s on 2 L nasal cannula.  However, his O2 sats subsequently decreased to 88% almost 2 L nasal cannula follow-up prompting initiation of high flow nasal cannula salter at 12 L/min, with ensuing improvement in oxygen saturations into the high 90s.  It is noted that he received core track placement yesterday, followed by initiation of free water flushes via the core track.  To further assess the above, I have ordered a stat chest x-ray, including to evaluate any interval progression from presenting COVID-19 infection versus evidence of secondary bacterial pneumonia versus interval development of acute volume overload.  In further assessing, have also ordered procalcitonin level, BNP, serum magnesium level, EKG. of note, serum phosphorus level already pending.      Newton Pigg, DO Hospitalist

## 2021-01-27 NOTE — Progress Notes (Signed)
°   01/27/21 0152  Assess: MEWS Score  Pulse Rate (!) 103  ECG Heart Rate (!) 102  Resp (!) 33  SpO2 94 %  O2 Device Nasal Cannula  O2 Flow Rate (L/min) 12 L/min  Assess: MEWS Score  MEWS Temp 0  MEWS Systolic 0  MEWS Pulse 1  MEWS RR 2  MEWS LOC 0  MEWS Score 3  MEWS Score Color Yellow  Assess: if the MEWS score is Yellow or Red  Were vital signs taken at a resting state? Yes  Focused Assessment Change from prior assessment (see assessment flowsheet)  Early Detection of Sepsis Score *See Row Information* Low  MEWS guidelines implemented *See Row Information* Yes  Treat  MEWS Interventions Consulted Respiratory Therapy (notified MD and charge nurse)  Pain Scale Faces  Pain Score 0  Take Vital Signs  Increase Vital Sign Frequency  Yellow: Q 2hr X 2 then Q 4hr X 2, if remains yellow, continue Q 4hrs  Notify: Charge Nurse/RN  Name of Charge Nurse/RN Notified Christophor Y. RN  Date Charge Nurse/RN Notified 01/27/21  Time Charge Nurse/RN Notified [id number]  Notify: Provider  Provider Name/Title 01/27/2021  Date Provider Notified 01/27/21  Time Provider Notified [phone]  Notification Type Page  Notification Reason Change in status  Provider response See new orders  Date of Provider Response 01/27/21  Time of Provider Response [phone]

## 2021-01-27 NOTE — Progress Notes (Signed)
TRH night cross cover note:  I was notified by RN of the patient's blood sugar this morning at 424.  This is in the context of yesterday's initiation of tube feeds.  I ordered NovoLog 5 units subcu x1 dose now, with next CBG check anticipated to occur at 8 AM.    Newton Pigg, DO Hospitalist

## 2021-01-27 NOTE — Progress Notes (Signed)
Physical Therapy Treatment Patient Details Name: Michael Wolfe MRN: [id number] DOB: 20-Feb-1931 Today's Date: 01/27/2021   History of Present Illness 88 y.o. male brought in by family member Feb 20, 2021 for increasing shortness of breath, chest pain/epigastric pain, vomiting. COVID+, encephalopathy; CT head "encephalomalacia in the left posterior parietal white matter. MRI correlation is suggested to exclude infarct or mass lesion." MRI pending  PMH significant of AAA, HTN, CAD with stable angina, aortic stenosis, CKD stage II, HLD, IIDM    PT Comments    Patient's wife and son present. Decided to attempt PT session to allow them to see how debilitated pt is and that he is unable to participate in mobility safely. Patient followed one command throughout 20 minute session ("open your eyes") and would not repeat. Performed PROM x 4 extremities as trying to get pt to follow commands. Attempted to get pt to wipe his face with washcloth. Family remains hopeful that he will improve enough to return home and not need SNF. PT recommendation remains SNF at this time.    Recommendations for follow up therapy are one component of a multi-disciplinary discharge planning process, led by the attending physician.  Recommendations may be updated based on patient status, additional functional criteria and insurance authorization.  Follow Up Recommendations  Skilled nursing-short term rehab (<3 hours/day)     Assistance Recommended at Discharge Frequent or constant Supervision/Assistance  Patient can return home with the following Two people to help with walking and/or transfers;Two people to help with bathing/dressing/bathroom;Assistance with cooking/housework;Assistance with feeding;Direct supervision/assist for medications management;Direct supervision/assist for financial management;Assist for transportation;Help with stairs or ramp for entrance   Equipment Recommendations  Other (comment) (TBD if improves and  SNF not needed)    Recommendations for Other Services       Precautions / Restrictions Precautions Precautions: Fall Restrictions Weight Bearing Restrictions: No     Mobility  Bed Mobility               General bed mobility comments: unsafe/unable to attempt supine to sit EOB; attempted to straighten in the bed with pt pushing back towards his right with his shoulders; pillow placed between pt's head and rail (also placed pillow between LLE and rail)    Transfers                        Ambulation/Gait                   Stairs             Wheelchair Mobility    Modified Rankin (Stroke Patients Only)       Balance                                            Cognition Arousal/Alertness: Lethargic Behavior During Therapy: Restless Overall Cognitive Status: Impaired/Different from baseline                                 General Comments: opened eyes on command x 1 and followed no other commands; allowed PROM x 4 extremities with only occasional resistance        Exercises Other Exercises Other Exercises: PROM x 4 extremities with pt occasionally resisting movement, but mostly allowed. Noted Rt wrist restraint 1/2 way up pt's arm  and causing incr swelling on either side of restraint. Removed and performed PROM and retrograde massage before replacing restraint around his wrist with 2 fingers able to slide under restraint    General Comments General comments (skin integrity, edema, etc.): HR 90s, sats 90-92% on [place] O2      Pertinent Vitals/Pain Pain Assessment: PAINAD Faces Pain Scale: No hurt Breathing: occasional labored breathing, short period of hyperventilation Negative Vocalization: none Facial Expression: sad, frightened, frown Body Language: tense, distressed pacing, fidgeting Consolability: unable to console, distract or reassure PAINAD Score: 5 Pain Intervention(s): Limited activity within  patient's tolerance    Home Living                          Prior Function            PT Goals (current goals can now be found in the care plan section) Acute Rehab PT Goals Patient Stated Goal: pt unable PT Goal Formulation: Patient unable to participate in goal setting Time For Goal Achievement: 02/05/21 Potential to Achieve Goals: Good Progress towards PT goals: Not progressing toward goals - comment    Frequency    Min 2X/week      PT Plan Current plan remains appropriate;Frequency needs to be updated    Co-evaluation              AM-PAC PT "6 Clicks" Mobility   Outcome Measure  Help needed turning from your back to your side while in a flat bed without using bedrails?: Total Help needed moving from lying on your back to sitting on the side of a flat bed without using bedrails?: Total Help needed moving to and from a bed to a chair (including a wheelchair)?: Total Help needed standing up from a chair using your arms (e.g., wheelchair or bedside chair)?: Total Help needed to walk in hospital room?: Total Help needed climbing 3-5 steps with a railing? : Total 6 Click Score: 6    End of Session Equipment Utilized During Treatment: Oxygen Activity Tolerance: Treatment limited secondary to medical complications (Comment) (encephalopathy and inability to follow commands) Patient left: in bed;with call bell/phone within reach;with bed alarm set;with family/visitor present   PT Visit Diagnosis: Difficulty in walking, not elsewhere classified (R26.2)     Time: [id number] PT Time Calculation (min) (ACUTE ONLY): 20 min  Charges:  $Therapeutic Exercise: 8-22 mins                      Arby Barrette, PT Acute Rehabilitation Services  Pager [phone] Office [phone]    Rexanne Mano 01/27/2021, 2:15 PM

## 2021-01-27 NOTE — Progress Notes (Signed)
eLink Physician-Brief Progress Note Patient Name: Michael Wolfe DOB: 04-Sep-1931 MRN: [id number]   Date of Service  01/27/2021  HPI/Events of Note  Patient needs a wrist restraints order to prevent him from pulling out his NG tube.  eICU Interventions  Wrist restraints ordered for safety and to prevent pulling out tubes and lines.        Thomasene Lot Reanna Scoggin 01/27/2021, 9:06 PM

## 2021-01-27 NOTE — Consult Note (Signed)
Neurology Consultation  Reason for Consult: encephalopathy.   Referring Physician: Noelle Penner, MD.   CC: encephalopathy.   History is obtained from: chart, son, and wife at bedside.   HPI: Michael Wolfe is a 88 y.o. male with a PMHx of AAA, HTN, CAD with stable angina, aortic stenosis, CKD IIIb, CAD on Plavix and statin, HLD, and IIDM who was brought in by family 5 days ago for SOB, chest pain, epigastric pain, and poor po intake.   Hospital course: Day 1: patient was confused and could not provide any history. He was found to be tachypneic and COVID + with patchy peripheral infiltrates compatible with viral PNA. He was started on Remdisivir and a short course of po steroids. AKI on CKD due to dehydration and poor po intake. A echocardiogram was done due to hx of AS which showed preserved EF, LVH, TAVR in aortic valve. No atrial shunt. He was more confused that night. Agitated.  Day 2: Hydration improved creatinine. Delirium thought to be secondary to hospital stay and complicated course. 2nd degree AVB found. Day 3: CTH and MRI brain negative for acute stroke. Prolonged Qtc which prohibited use of antipsychotics. Day 4: Stated on Midodrine due to hypotension. Hypernatremia treated with LR and free water. Marinol added. Day 5: Coretrak placed. Free water per tube. On TFs. Hypoxia noted at night. He was placed on high flow and O2 sat normalized. BNP, procalcitonin, and mag level normal. EKG showed Qtc of 476. MEWS red today due to tachycardia, tachypnea.   Because of his encephalopathy, neurology was asked to consult.   Per family at bedside "this is not him". He loves to talk and joke around. Never been thought to have dementia. Before hospitalization, he could walk and talk normally.   ROS: Unable to obtain due to altered mental status.   Past Medical History:  Diagnosis Date   AAA (abdominal aortic aneurysm)    Anginal pain ([place])    Arthritis    "left shoulder" (07/04/2014)    Constipation    Coronary artery disease    Depression    GERD (gastroesophageal reflux disease)    Headache    History of hiatal hernia    Hyperlipidemia    Hypertension    Inferior myocardial infarction [facility]) 2003   Archie Endo 06/25/2014   Kidney stones X 1   Osteoarthritis    Pneumonia 1935; 1990's X 1   Severe aortic stenosis 05/06/2014   Shortness of breath dyspnea    Type II diabetes mellitus ([place])     Family History  Problem Relation Age of Onset   Hypertension Father    AAA (abdominal aortic aneurysm) Father    Cancer Mother     Social History:   reports that he has never smoked. He has never used smokeless tobacco. He reports that he does not drink alcohol and does not use drugs.  Medications  Current Facility-Administered Medications:    chlorhexidine (PERIDEX) 0.12 % solution 15 mL, 15 mL, Mouth Rinse, BID, Elgergawy, Silver Huguenin, MD, 15 mL at 01/27/21 1239   clopidogrel (PLAVIX) tablet 75 mg, 75 mg, Oral, Daily, Wynetta Fines T, MD, 75 mg at 01/27/21 0857   dexamethasone (DECADRON) tablet 6 mg, 6 mg, Oral, Q24H, Wynetta Fines T, MD, 6 mg at 01/26/21 2324   feeding supplement (OSMOLITE 1.5 CAL) liquid 1,000 mL, 1,000 mL, Per Tube, Continuous, Elgergawy, Silver Huguenin, MD, Last Rate: 40 mL/hr at 01/27/21 0923, Rate Change at 01/27/21 0923   feeding supplement (  PROSource TF) liquid 45 mL, 45 mL, Per Tube, BID, Elgergawy, Silver Huguenin, MD, 45 mL at 01/27/21 0857   free water 250 mL, 250 mL, Per Tube, Q4H, Elgergawy, Silver Huguenin, MD, 250 mL at 01/27/21 1240   heparin injection 5,000 Units, 5,000 Units, Subcutaneous, Q8H, Wynetta Fines T, MD, 5,000 Units at 01/27/21 0539   hydrALAZINE (APRESOLINE) injection 10 mg, 10 mg, Intravenous, Q6H PRN, Vernelle Emerald, MD, 10 mg at 01/26/21 0554   insulin aspart (novoLOG) injection 0-15 Units, 0-15 Units, Subcutaneous, Q4H, Elgergawy, Silver Huguenin, MD, 8 Units at 01/27/21 1239   insulin glargine-yfgn (SEMGLEE) injection 12 Units, 12 Units, Subcutaneous,  Daily, Elgergawy, Silver Huguenin, MD, 12 Units at 01/27/21 0857   Ipratropium-Albuterol (COMBIVENT) respimat 1 puff, 1 puff, Inhalation, Q6H PRN, Elgergawy, Silver Huguenin, MD   MEDLINE mouth rinse, 15 mL, Mouth Rinse, q12n4p, Elgergawy, Silver Huguenin, MD, 15 mL at 01/27/21 1241   midodrine (PROAMATINE) tablet 5 mg, 5 mg, Oral, TID WC, Elgergawy, Silver Huguenin, MD, 5 mg at 01/27/21 1241   rosuvastatin (CRESTOR) tablet 20 mg, 20 mg, Oral, Daily, Wynetta Fines T, MD, 20 mg at 01/27/21 0857   vitamin B-12 (CYANOCOBALAMIN) tablet 250 mcg, 250 mcg, Oral, Daily, Elgergawy, Silver Huguenin, MD, 250 mcg at 01/27/21 0858  Exam: Current vital signs: BP (!) 127/54 (BP Location: Left Arm)    Pulse (!) 108    Temp 100.3 F (37.9 C) (Axillary)    Resp (!) 24    Ht 5\' 7"  (1.702 m)    Wt 83.3 kg    SpO2 95%    BMI 28.76 kg/m  Vital signs in last 24 hours: Temp:  [98.9 F (37.2 C)-100.3 F (37.9 C)] 100.3 F (37.9 C) (01/03 1204) Pulse Rate:  [96-114] 108 (01/03 1204) Resp:  [20-38] 24 (01/03 1204) BP: (91-148)/(39-91) 127/54 (01/03 1204) SpO2:  [91 %-96 %] 95 % (01/03 1204) Weight:  [83.3 kg] 83.3 kg (01/03 0402)  PE: GENERAL:Acute on chronically ill appearing elderly male. Moving around in bed. NAD.  HEENT: normocephalic and atraumatic. LUNGS - Normal respiratory effort.  CV - RRR on tele. ABDOMEN - Soft, nontender. Ext: warm, well perfused. Psych: affect dull. Non talkative or interactive.   NEURO:  Mental Status: Does not answer orientation questions, speak, name, and can not gage his comprehension. He moans with noxious stimuli.    Cranial Nerves:  II:He will not open his eyes and resists passive opening. No tracking noted.  Face is symmetrical at rest. Hearing intact to loud voice and gets irritated with noxious stimuli. Tongue is without fasciculations.   III, IV, VI: EOMI. Lid elevation symmetric and full.  Motor:  He moves legs spontaneously. His UEs are in restraints, so difficult to tell, but he does react to  noxious stimuli in UEs. He grimaces and withdraws to noxious stimuli to legs.  Tone is normal. Bulk is decreased.   Labs I have reviewed labs in epic and the results pertinent to this consultation are:   CBC    Component Value Date/Time   WBC 11.2 (H) 01/27/2021 0100   RBC 4.89 01/27/2021 0100   HGB 15.1 01/27/2021 0100   HCT 47.5 01/27/2021 0100   PLT 120 (L) 01/27/2021 0100   MCV 97.1 01/27/2021 0100   MCH 30.9 01/27/2021 0100   MCHC 31.8 01/27/2021 0100   RDW 13.9 01/27/2021 0100   LYMPHSABS 1.1 01/26/2021 0249   MONOABS 1.3 (H) 01/26/2021 0249   EOSABS 0.0 01/26/2021 0249   BASOSABS  0.0 01/26/2021 0249    CMP     Component Value Date/Time   NA 148 (H) 01/27/2021 0100   K 4.6 01/27/2021 0100   CL 116 (H) 01/27/2021 0100   CO2 24 01/27/2021 0100   GLUCOSE 311 (H) 01/27/2021 0100   BUN 52 (H) 01/27/2021 0100   CREATININE 1.75 (H) 01/27/2021 0100   CREATININE 1.5 (H) 03/14/2015 1547   CREATININE 1.73 (H) 03/03/2015 1522   CALCIUM 8.4 (L) 01/27/2021 0100   PROT 5.2 (L) 01/27/2021 0100   ALBUMIN 2.9 (L) 01/27/2021 0100   AST 33 01/27/2021 0100   ALT 35 01/27/2021 0100   ALKPHOS 66 01/27/2021 0100   BILITOT 1.2 01/27/2021 0100   GFRNONAA 37 (L) 01/27/2021 0100   GFRAA 38 (L) 07/09/2019 [id number]    Imaging MD reviewed the images obtained.  CT head Increasing area of encephalomalacia in the left posterior parietal white matter. MRI correlation is suggested to exclude infarct or mass lesion. No significant mass effect is identified. 2.   Chronic atrophy and small vessel ischemic changes  MRI brain Truncated study but still beneficial in characterizing the left parietal abnormality as a remote infarct.    Assessment: Michael Wolfe is an 88 yo male who was independent at home until he came here. He is very encephalopathic which we think is multifactorial with metabolic derangements, FTT, COVID, and PNA. It seems as if he has had some type of issue with all his chronic  health problems since here. Do feel that delirium is playing a role as he is very sick with prolonged stay in hospital. Family reports he sometimes waxes and wanes in mental status and has said a few words at varied times. Not today, on exam though, he is very encephalopathic. His prolonged Qtc has made it difficult to use medications that would not interfere with his delirium. He has been getting Ativan, which is not a good idea, but all we had to offer. As soon as Qtc is in range, try antipsychotics instead.    Impression: -Multifactorial encephalopathy.  -COVID.  -FTT.  -prolonged Qtc.  -multiple acute on chronic illnesses.  -Delirium.   Recommendations/Plan:  -Will use Valproic acid for encephalopathy. Will start at 250mg  Bid and titrate from there. -Delirium precautions.  -Continue Vit B12 supplementation. Goal is over 500.  -Continue Plavix and statin due to CAD.   Pt seen by Clance Boll, NP/Neuro and later by MD. Note/plan to be edited by MD as needed.  Pager: [phone]  NEUROHOSPITALIST ADDENDUM Performed a face to face diagnostic evaluation.   I have reviewed the contents of history and physical exam as documented by PA/ARNP/Resident and agree with above documentation.  I have discussed and formulated the above plan as documented. Edits to the note have been made as needed.  Impression/Key exam findings/Plan: Has Covid pneumonia with COVID induced encephalopathy. Given elevated Qtc, will use Valproic acid for agitation instead of seroquel or ativan. Will start at 250mg  BID and titrate based on response. Recommend delirium precaution.  Donnetta Simpers, MD [facility] [phone]   If 7pm to 7am, please call on call as listed on AMION.

## 2021-01-27 NOTE — Progress Notes (Addendum)
TRH night cross cover note:  I was notified by RN of the patient's agitation, pulling at lines, and general interfering with medical care, which has been refractory to attempts at verbal redirection.  Consequently, I have renewed his existing orders for soft bilateral wrist restraints.  Additionally, I have ordered a one-time dose of Ativan 0.5 mg IV x1.  This was after reviewing TRH signout, which included recommendations to avoid antipsychotic medications due to history of QTC prolongation as well as recommendation to refrain from diazepam due to resultant altered mental status persisting into the ensuing day.  With this signout in mind, I ordered this one time dose of IV Ativan.    Newton Pigg, DO Hospitalist

## 2021-01-27 NOTE — Progress Notes (Signed)
TRH night cross cover note:  I was notified by RN that patient's supplemental oxygen requirements have stabilized.  Blood pressure slightly softer than before, although MAP remains adequate at 82 mmHg. Will provide small IV fluid bolus, namely normal saline 500 cc over 2 hours.      Newton Pigg, DO Hospitalist

## 2021-01-27 NOTE — Consult Note (Addendum)
NAME:  Michael Wolfe, MRN:  [id number], DOB:  [birth year], LOS: 5 ADMISSION DATE:  01/18/2021, CONSULTATION DATE:  01/27/21 REFERRING MD:  Elgergawy - TRH, CHIEF COMPLAINT:  AMS, tachypnea    History of Present Illness:  88 yo M PMH HTN, AAA, CAD, AS, CKD IIIB, CAD on plavix, DM admitted to [facility] 12/28 after presenting for SOB, chest pain, epigastric pain.  Found to be COVID-19 positive, and CXR revealed patchy infiltrates. Started on remdesivir, steroids. Noted to be delirious fairly early in hospital course -- went for [facility] and MRI brain 12/28 and 12/29 which were negative for acute intracranial process. He has required supplemental O2. BNP PCT are grossly unremarkable.   On 1/3 The patient remains encephalopathic and hypoxic. PCCM is consulted in this setting   Pertinent  Medical History  AAA CAD HTN DM Angina AS CKDIIIb HLD   Significant Hospital Events: Including procedures, antibiotic start and stop dates in addition to other pertinent events   12/28 admitted to  [facility], covid +, started on remdesivir and steroids. CT head to r/o intracranial process to explain encephalopathy present on admission 12/29 MRI without acute intracranial abnormality 12/ 30 IVF for AKI. Remains encephalopathic 1/1 remains encephalopathic. qtc is prolonged thus limited Rx options  1/3 progressive hypoxia overnight, starting on [facility] now on HFNC. Remains encephalopathic. Neuro and CCM consulted   Interim History / Subjective:  Consulted for tachypnea, hypoxia, AMS   Patient's son and wife at the bedside. Patient's other son, Monica Martinez, joined via phone. All were in agreement for short trial of mechanical ventilation if needed.  Unable to obtain history from patient due to encephalopathy  Objective   Blood pressure (!) 127/54, pulse (!) 108, temperature 100.3 F (37.9 C), temperature source Axillary, resp. rate (!) 24, height 5\' 7"  (1.702 m), weight 83.3 kg, SpO2 95 %.        Intake/Output Summary (Last 24  hours) at 01/27/2021 1701 Last data filed at 01/27/2021 1100 Gross per 24 hour  Intake 2138.19 ml  Output 1250 ml  Net 888.19 ml   Filed Weights   01/22/21 0035 01/27/21 0402  Weight: 85.8 kg 83.3 kg    Examination: General: elderly male, acute ill HENT: dry mucous membranes, sclera anicteric, PERRL Lungs: diminished breath sounds, tachypneic Cardiovascular: tachycardic, no murmurs Abdomen: soft, non-tender, non-distended Extremities: warm, no edema Neuro: not following commands, involuntarily moving all extremities GU: no foley  Resolved Hospital Problem list     Assessment & Plan:   Acute encephalopathy -has been altered since presentation, sounds like has worsened over hospital course -Nothing acute on neuro imaging to explain. Remote stroke -COVID + -- query covid encephalopathy. At this point do suspect superimposed delirium related to hospitalization, steroids -B12 and folate wnl P -delirium precautions -neuro consulted  - check EEG - stop dexamethasone  Acute respiratory failure with hypoxia due to Covid -19  COVID-19 PNA R>L -s/p remdesivir  - Concern for superimposed bacterial pneumonia vs aspiration pneumonia P -IS, mobility, supplemental O2 as needed  -Unasyn added 1/3, changed to ceftriaxone + azithromycin -- not sure if aspiration is applicable here (normal PCT, WBC) but with encephalopathy + respiratory decline is not unreasonable for short course  - follow up urine strep and legionella antigens - check MRSA swab, consider adding vanc if positive - Check CTA Chest to rule out PE - Place patient on non-rebreather mask to mouth breathing - May require HHFNC  AKI on CKD IIIB  Hypernatremia Hyperchloremia - In setting of  hypovolemia - bolus dextrose due to free water defecit - Continue FW flushed per NG tube - monitor renal function given contrasted scan today  DM with hyperglycemia -steroids, acute illness and initiation of tube feeds - stop  steroids - Insuline Glargine 12 units BID - continue SSI  - add tube feed coverage if needed, may need insulin drip  Best Practice (right click and "Reselect all SmartList Selections" daily)   Diet/type: tubefeeds DVT prophylaxis: prophylactic heparin  GI prophylaxis: N/A Lines: N/A Foley:  N/A Code Status:  full code Last date of multidisciplinary goals of care discussion [discussed with patient's wife and 2 sons, they would like to proceed with short trial of intubation if needed]  Labs   CBC: Recent Labs  Lab 01/22/21 0131 01/23/21 0106 01/24/21 0117 01/25/21 0117 01/26/21 0249 01/27/21 0100  WBC 4.6 8.1 9.9 12.9* 14.5* 11.2*  NEUTROABS 3.7 6.9 8.6* 11.7* 12.0*  --   HGB 15.0 16.6 17.0 16.9 16.3 15.1  HCT 44.7 47.8 49.5 49.8 48.6 47.5  MCV 92.4 92.6 92.9 93.6 95.5 97.1  PLT 98* 120* 154 148* 145* 120*    Basic Metabolic Panel: Recent Labs  Lab 01/23/21 0106 01/24/21 0117 01/25/21 0117 01/26/21 0249 01/27/21 0100  NA 140 142 146* 149* 148*  K 5.5* 4.4 4.2 4.8 4.6  CL 114* 113* 117* 116* 116*  CO2 16* 18* 19* 22 24  GLUCOSE 198* 232* 249* 226* 311*  BUN 33* 46* 56* 53* 52*  CREATININE 1.48* 1.91* 1.93* 1.91* 1.75*  CALCIUM 8.4* 8.6* 8.7* 8.6* 8.4*  MG 2.0 2.4 2.3 2.2 2.3  PHOS 2.6 2.9 2.7 3.5 3.3   GFR: Estimated Creatinine Clearance: 30.7 mL/min (A) (by C-G formula based on SCr of 1.75 mg/dL (H)). Recent Labs  Lab 01/22/21 0131 01/22/21 0504 01/23/21 0106 01/24/21 0117 01/25/21 0117 01/26/21 0249 01/27/21 0100  PROCALCITON <0.10  --   --   --   --   --  0.19  WBC 4.6  --    < > 9.9 12.9* 14.5* 11.2*  LATICACIDVEN  --  1.8  --   --   --   --   --    < > = values in this interval not displayed.    Liver Function Tests: Recent Labs  Lab 01/23/21 0106 01/24/21 0117 01/25/21 0117 01/26/21 0249 01/27/21 0100  AST 53* 65* 60* 42* 33  ALT 40 51* 56* 40 35  ALKPHOS 70 82 82 71 66  BILITOT 1.7* 0.7 0.9 1.6* 1.2  PROT 6.4* 6.3* 6.2* 5.7* 5.2*   ALBUMIN 3.4* 3.4* 3.4* 3.2* 2.9*   No results for input(s): LIPASE, AMYLASE in the last 168 hours. Recent Labs  Lab 01/22/21 0131  AMMONIA 29    ABG    Component Value Date/Time   PHART 7.474 (H) 01/27/2021 1603   PCO2ART 33.2 01/27/2021 1603   PO2ART 62.1 (L) 01/27/2021 1603   HCO3 24.1 01/27/2021 1603   TCO2 26 07/08/2016 0304   ACIDBASEDEF 0.8 08/15/2014 0923   O2SAT 92.3 01/27/2021 1603     Coagulation Profile: No results for input(s): INR, PROTIME in the last 168 hours.  Cardiac Enzymes: No results for input(s): CKTOTAL, CKMB, CKMBINDEX, TROPONINI in the last 168 hours.  HbA1C: Hgb A1c MFr Bld  Date/Time Value Ref Range Status  01/22/2021 01:31 AM 8.2 (H) 4.8 - 5.6 % Final    Comment:    (NOTE) Pre diabetes:          5.7%-6.4%  Diabetes:              >6.4%  Glycemic control for   <7.0% adults with diabetes   08/15/2014 09:23 AM 7.5 (H) 4.8 - 5.6 % Final    Comment:    (NOTE)         Pre-diabetes: 5.7 - 6.4         Diabetes: >6.4         Glycemic control for adults with diabetes: <7.0     CBG: Recent Labs  Lab 01/26/21 2326 01/27/21 0401 01/27/21 0838 01/27/21 1207 01/27/21 1609  GLUCAP 258* 424* 319* 272* 280*    Review of Systems:   Unable to perform review of systems due to mental status.  Past Medical History:  He,  has a past medical history of AAA (abdominal aortic aneurysm), Anginal pain ([place]), Arthritis, Constipation, Coronary artery disease, Depression, GERD (gastroesophageal reflux disease), Headache, History of hiatal hernia, Hyperlipidemia, Hypertension, Inferior myocardial infarction ([place]) (2003), Kidney stones (X 1), Osteoarthritis, Pneumonia (1935; 1990's X 1), Severe aortic stenosis (05/06/2014), Shortness of breath dyspnea, and Type II diabetes mellitus ([place]).   Surgical History:   Past Surgical History:  Procedure Laterality Date   ABDOMINAL AORTIC ENDOVASCULAR STENT GRAFT N/A 08/22/2014   Procedure: ABDOMINAL AORTIC  ENDOVASCULAR STENT GRAFT- GORE ;  Surgeon: Serafina Mitchell, MD;  Location: [facility] OR;  Service: Vascular;  Laterality: N/A;   CARDIAC CATHETERIZATION  2003; 2016   Archie Endo 06/25/2014   CARDIAC VALVE REPLACEMENT     COLONOSCOPY     CORONARY ANGIOGRAM  09/12/2012   Procedure: CORONARY ANGIOGRAM;  Surgeon: Clent Demark, MD;  Location: [place] CATH LAB;  Service: Cardiovascular;;   CORONARY ANGIOPLASTY WITH STENT PLACEMENT  2009   Archie Endo 06/25/2014   LEFT AND RIGHT HEART CATHETERIZATION WITH CORONARY ANGIOGRAM N/A 05/21/2014   Procedure: LEFT AND RIGHT HEART CATHETERIZATION WITH CORONARY ANGIOGRAM;  Surgeon: Charolette Forward, MD;  Location: [facility] CATH LAB;  Service: Cardiovascular;  Laterality: N/A;   LITHOTRIPSY     TEE WITHOUT CARDIOVERSION N/A 07/02/2014   Procedure: TRANSESOPHAGEAL ECHOCARDIOGRAM (TEE);  Surgeon: Sherren Mocha, MD;  Location: [place];  Service: Open Heart Surgery;  Laterality: N/A;   TRANSCATHETER AORTIC VALVE REPLACEMENT, TRANSFEMORAL N/A 07/02/2014   Procedure: TRANSCATHETER AORTIC VALVE REPLACEMENT, TRANSFEMORAL;  Surgeon: Sherren Mocha, MD;  Location: [place];  Service: Open Heart Surgery;  Laterality: N/A;  TAVR-TF APPROACH (23MM VALVE)     Social History:   reports that he has never smoked. He has never used smokeless tobacco. He reports that he does not drink alcohol and does not use drugs.   Family History:  His family history includes AAA (abdominal aortic aneurysm) in his father; Cancer in his mother; Hypertension in his father.   Allergies No Known Allergies   Home Medications  Prior to Admission medications   Medication Sig Start Date End Date Taking? Authorizing Provider  amLODipine (NORVASC) 5 MG tablet Take 5 mg by mouth daily.   Yes [provider]  clopidogrel (PLAVIX) 75 MG tablet Take 75 mg by mouth daily.   Yes [provider]  Ibuprofen-diphenhydrAMINE Cit (ADVIL PM) 200-38 MG TABS Take 1-2 tablets by mouth at bedtime as needed (pain, sleep).   Yes  [provider]  lisinopril (PRINIVIL,ZESTRIL) 40 MG tablet Take 40 mg by mouth in the morning and at bedtime.    Yes [provider]  rosuvastatin (CRESTOR) 10 MG tablet Take 10 mg by mouth daily.   Yes [provider]  Sennosides 25 MG TABS Take 25 tablets by mouth daily as needed (constipation).   Yes [provider]  glimepiride (AMARYL) 4 MG tablet Take 4 mg by mouth daily.    [provider]  ibuprofen (ADVIL) 200 MG tablet Take 200 mg by mouth every 6 (six) hours as needed for mild pain.    [provider]  metFORMIN (GLUCOPHAGE) 500 MG tablet Take 500 mg by mouth in the morning, at noon, in the evening, and at bedtime.     [provider]  metoprolol succinate (TOPROL-XL) 25 MG 24 hr tablet Take 25 mg by mouth daily. 10/20/20   [provider]     Critical care time: 56 minutes    Freda Jackson, MD [place] Office: [phone]   See Amion for personal pager PCCM on call pager [phone] until 7pm. Please call Elink 7p-7a. [phone]

## 2021-01-28 ENCOUNTER — Inpatient Hospital Stay (HOSPITAL_COMMUNITY): Payer: Medicare Other

## 2021-01-28 LAB — CBC
HCT: 45.1 % (ref 39.0–52.0)
Hemoglobin: 14.5 g/dL (ref 13.0–17.0)
MCH: 31.6 pg (ref 26.0–34.0)
MCHC: 32.2 g/dL (ref 30.0–36.0)
MCV: 98.3 fL (ref 80.0–100.0)
Platelets: 93 10*3/uL — ABNORMAL LOW (ref 150–400)
RBC: 4.59 MIL/uL (ref 4.22–5.81)
RDW: 13.7 % (ref 11.5–15.5)
WBC: 12.1 10*3/uL — ABNORMAL HIGH (ref 4.0–10.5)
nRBC: 0 % (ref 0.0–0.2)

## 2021-01-28 LAB — GLUCOSE, CAPILLARY
Glucose-Capillary: 216 mg/dL — ABNORMAL HIGH (ref 70–99)
Glucose-Capillary: 328 mg/dL — ABNORMAL HIGH (ref 70–99)
Glucose-Capillary: 393 mg/dL — ABNORMAL HIGH (ref 70–99)
Glucose-Capillary: 315 mg/dL — ABNORMAL HIGH (ref 70–99)
Glucose-Capillary: 330 mg/dL — ABNORMAL HIGH (ref 70–99)
Glucose-Capillary: 351 mg/dL — ABNORMAL HIGH (ref 70–99)

## 2021-01-28 LAB — C-REACTIVE PROTEIN: CRP: 1.9 mg/dL — ABNORMAL HIGH (ref <1.0)

## 2021-01-28 LAB — COMPREHENSIVE METABOLIC PANEL
ALT: 32 U/L (ref 0–44)
AST: 31 U/L (ref 15–41)
Albumin: 2.5 g/dL — ABNORMAL LOW (ref 3.5–5.0)
Alkaline Phosphatase: 59 U/L (ref 38–126)
Anion gap: 7 (ref 5–15)
BUN: 62 mg/dL — ABNORMAL HIGH (ref 8–23)
CO2: 24 mmol/L (ref 22–32)
Calcium: 8.5 mg/dL — ABNORMAL LOW (ref 8.9–10.3)
Chloride: 117 mmol/L — ABNORMAL HIGH (ref 98–111)
Creatinine, Ser: 2.03 mg/dL — ABNORMAL HIGH (ref 0.61–1.24)
GFR, Estimated: 31 mL/min — ABNORMAL LOW (ref >60)
Glucose, Bld: 231 mg/dL — ABNORMAL HIGH (ref 70–99)
Potassium: 4.2 mmol/L (ref 3.5–5.1)
Sodium: 148 mmol/L — ABNORMAL HIGH (ref 135–145)
Total Bilirubin: 0.9 mg/dL (ref 0.3–1.2)
Total Protein: 5 g/dL — ABNORMAL LOW (ref 6.5–8.1)

## 2021-01-28 LAB — PHOSPHORUS: Phosphorus: 2.7 mg/dL (ref 2.5–4.6)

## 2021-01-28 LAB — PROCALCITONIN: Procalcitonin: 0.93 ng/mL

## 2021-01-28 LAB — AMMONIA: Ammonia: 32 umol/L (ref 9–35)

## 2021-01-28 MED ORDER — VALPROIC ACID 250 MG/5ML PO SOLN
250.0000 mg | Freq: Two times a day (BID) | ORAL | Status: DC
  Administered 2021-01-28 – 2021-01-29 (×3): 250 mg
  Filled 2021-01-28 (×3): qty 5

## 2021-01-28 MED ORDER — MIDODRINE HCL 5 MG PO TABS
5.0000 mg | ORAL_TABLET | Freq: Three times a day (TID) | ORAL | Status: DC
  Administered 2021-01-28 (×2): 5 mg
  Filled 2021-01-28 (×3): qty 1

## 2021-01-28 MED ORDER — VALPROIC ACID 250 MG/5ML PO SOLN
250.0000 mg | Freq: Once | ORAL | Status: AC
  Administered 2021-01-28: 250 mg
  Filled 2021-01-28: qty 5

## 2021-01-28 MED ORDER — AZITHROMYCIN 500 MG PO TABS
500.0000 mg | ORAL_TABLET | Freq: Every day | ORAL | Status: DC
  Administered 2021-01-28 – 2021-01-29 (×2): 500 mg
  Filled 2021-01-28 (×3): qty 1

## 2021-01-28 MED ORDER — CLOPIDOGREL BISULFATE 75 MG PO TABS
75.0000 mg | ORAL_TABLET | Freq: Every day | ORAL | Status: DC
  Administered 2021-01-28 – 2021-01-29 (×2): 75 mg
  Filled 2021-01-28 (×2): qty 1

## 2021-01-28 MED ORDER — HALOPERIDOL LACTATE 5 MG/ML IJ SOLN
1.0000 mg | INTRAMUSCULAR | Status: DC | PRN
  Administered 2021-01-28 – 2021-01-30 (×7): 4 mg via INTRAVENOUS
  Filled 2021-01-28 (×7): qty 1

## 2021-01-28 MED ORDER — METHYLPREDNISOLONE SODIUM SUCC 40 MG IJ SOLR
20.0000 mg | Freq: Every day | INTRAMUSCULAR | Status: DC
  Administered 2021-01-28 – 2021-01-29 (×2): 20 mg via INTRAVENOUS
  Filled 2021-01-28 (×2): qty 1

## 2021-01-28 MED ORDER — CYANOCOBALAMIN 500 MCG PO TABS
250.0000 ug | ORAL_TABLET | Freq: Every day | ORAL | Status: DC
  Administered 2021-01-28 – 2021-01-29 (×2): 250 ug
  Filled 2021-01-28 (×3): qty 1

## 2021-01-28 MED ORDER — INSULIN ASPART 100 UNIT/ML IJ SOLN
3.0000 [IU] | INTRAMUSCULAR | Status: DC
  Administered 2021-01-28 – 2021-01-29 (×6): 3 [IU] via SUBCUTANEOUS

## 2021-01-28 MED ORDER — POLYETHYLENE GLYCOL 3350 17 G PO PACK
17.0000 g | PACK | Freq: Every day | ORAL | Status: DC
  Administered 2021-01-28: 17 g
  Filled 2021-01-28: qty 1

## 2021-01-28 MED ORDER — OLANZAPINE 10 MG IM SOLR
5.0000 mg | Freq: Once | INTRAMUSCULAR | Status: AC | PRN
  Administered 2021-01-28: 5 mg via INTRAMUSCULAR
  Filled 2021-01-28: qty 10

## 2021-01-28 MED ORDER — INSULIN ASPART 100 UNIT/ML IJ SOLN
0.0000 [IU] | INTRAMUSCULAR | Status: DC
  Administered 2021-01-28 (×2): 20 [IU] via SUBCUTANEOUS
  Administered 2021-01-28 (×2): 15 [IU] via SUBCUTANEOUS
  Administered 2021-01-29: 11 [IU] via SUBCUTANEOUS
  Administered 2021-01-29 (×3): 20 [IU] via SUBCUTANEOUS
  Administered 2021-01-29: 11 [IU] via SUBCUTANEOUS
  Administered 2021-01-30: 20 [IU] via SUBCUTANEOUS

## 2021-01-28 MED ORDER — ROSUVASTATIN CALCIUM 20 MG PO TABS
20.0000 mg | ORAL_TABLET | Freq: Every day | ORAL | Status: DC
  Administered 2021-01-28 – 2021-01-29 (×2): 20 mg
  Filled 2021-01-28 (×2): qty 1

## 2021-01-28 MED ORDER — FREE WATER
400.0000 mL | Status: DC
  Administered 2021-01-28 – 2021-01-30 (×10): 400 mL

## 2021-01-28 MED ORDER — DOCUSATE SODIUM 50 MG/5ML PO LIQD
100.0000 mg | Freq: Two times a day (BID) | ORAL | Status: DC
  Administered 2021-01-28 (×2): 100 mg
  Filled 2021-01-28 (×2): qty 10

## 2021-01-28 MED ORDER — SODIUM CHLORIDE 0.9 % IV SOLN
600.0000 mg | Freq: Once | INTRAVENOUS | Status: AC
  Administered 2021-01-28: 600 mg via INTRAVENOUS
  Filled 2021-01-28: qty 10

## 2021-01-28 NOTE — Progress Notes (Signed)
eLink Physician-Brief Progress Note Patient Name: Michael Wolfe DOB: 01-08-1932 MRN: [id number]   Date of Service  01/28/2021  HPI/Events of Note  Patient needs bilateral soft wrist restraints for safety.  eICU Interventions  Restraints ordered.        Thomasene Lot Reiner Loewen 01/28/2021, 3:04 AM

## 2021-01-28 NOTE — Progress Notes (Signed)
OT Cancellation Note  Patient Details Name: MICHALL NOFFKE MRN: [id number] DOB: [birth year]   Cancelled Treatment:    Reason Eval/Treat Not Completed: Other (comment)- spoke to RN, pt getting setup on EEG and request OT to hold at this time.  Will follow and see as able.   Barry Brunner, OT Acute Rehabilitation Services Pager [phone] Office [phone]   Chancy Milroy 01/28/2021, 11:07 AM

## 2021-01-28 NOTE — Progress Notes (Signed)
EEG complete - results pending 

## 2021-01-28 NOTE — Progress Notes (Signed)
Unable to take to CT scan overnight d/t delirium and respiratory effort. Will attempt later.

## 2021-01-28 NOTE — Procedures (Signed)
Patient Name: ADEEB KONECNY  MRN: [id number]  Epilepsy Attending: Charlsie Quest  Referring Physician/Provider: Martina Sinner, MD Date: 01/28/2021  Duration: 23.22 mins  Patient history: 88 year old male with altered mental status.  EEG to evaluate for seizure.  Level of alertness: Awake  AEDs during EEG study: None  Technical aspects: This EEG study was done with scalp electrodes positioned according to the 10-20 International system of electrode placement. Electrical activity was acquired at a sampling rate of 500Hz  and reviewed with a high frequency filter of 70Hz  and a low frequency filter of 1Hz . EEG data were recorded continuously and digitally stored.   Description:EEG showed continuous generalized 3 to 6 Hz theta-delta slowing. Hyperventilation and photic stimulation were not performed.     Note, EEG was technically difficult due to significant movement and electrode artifact.  ABNORMALITY - Continuous slow, generalized  IMPRESSION: This technically difficult study is suggestive of moderate diffuse encephalopathy, nonspecific etiology. No seizures or epileptiform discharges were   Quante Pettry 

## 2021-01-28 NOTE — Progress Notes (Signed)
eLink Physician-Brief Progress Note Patient Name: Michael Wolfe DOB: 1931-08-31 MRN: [id number]   Date of Service  01/28/2021  HPI/Events of Note  ABG reviewed and acceptable, will treat delirium with Zyprexa.  eICU Interventions  Zyprexa 5 mg IM  x 1 for delirium.        Thomasene Lot Jsean Taussig 01/28/2021, 12:03 AM

## 2021-01-28 NOTE — Progress Notes (Signed)
Report given to 3M RN. 

## 2021-01-28 NOTE — Progress Notes (Signed)
NAME:  Michael Wolfe, MRN:  [id number], DOB:  [birth year], LOS: 6 ADMISSION DATE:  01/17/2021, CONSULTATION DATE:  01/27/21 REFERRING MD:  Elgergawy - TRH, CHIEF COMPLAINT:  AMS, tachypnea    History of Present Illness:  88 yo M PMH HTN, AAA, CAD, AS, CKD IIIB, CAD on plavix, DM admitted to [facility] 12/28 after presenting for SOB, chest pain, epigastric pain.  Found to be COVID-19 positive, and CXR revealed patchy infiltrates. Started on remdesivir, steroids. Noted to be delirious fairly early in hospital course -- went for [facility] and MRI brain 12/28 and 12/29 which were negative for acute intracranial process. He has required supplemental O2. BNP PCT are grossly unremarkable.   On 1/3 The patient remains encephalopathic and hypoxic. PCCM is consulted in this setting   Pertinent  Medical History  AAA CAD HTN DM Angina AS CKDIIIb HLD   Significant Hospital Events: Including procedures, antibiotic start and stop dates in addition to other pertinent events   12/28 admitted to [facility], covid +, started on remdesivir and steroids. CT head to r/o intracranial process to explain encephalopathy present on admission 12/29 MRI without acute intracranial abnormality 12/ 30 IVF for AKI. Remains encephalopathic 1/1 remains encephalopathic. qtc is prolonged thus limited Rx options  1/3 progressive hypoxia overnight, starting on [facility] now on HFNC. Remains encephalopathic. Neuro and CCM consulted. Transferred to ICU. Steroids stopped. 1L D5 bolus.   Interim History / Subjective:   Overnight received 5mg  zyprexa.   Making noises this morning, but still very encephalopathic   ABG is WNL  More hyperglycemic. Still hypernatremic. AKI is worse today with Cr 2.03 from 1.75   Objective   Blood pressure 128/67, pulse (!) 112, temperature (!) 100.9 F (38.3 C), temperature source Axillary, resp. rate (!) 41, height 5\' 7"  (1.702 m), weight 77 kg, SpO2 95 %.        Intake/Output Summary (Last 24 hours) at  01/28/2021 1154 Last data filed at 01/28/2021 1100 Gross per 24 hour  Intake 2675.15 ml  Output 750 ml  Net 1925.15 ml   Filed Weights   01/27/21 0402 01/27/21 2057 01/28/21 0500  Weight: 83.3 kg 77.6 kg 77 kg    Examination: General: Elderly acutely ill appearing M reclined in bed agitated  HENT: Dry mm. Unable to assess eyes. Cortrak in place.  Lungs: Shallow, tachypneic respirations. Diminished breath sounds  Cardiovascular: rrr cap refill < 3 seconds. s1s2 Abdomen: soft ndnt + bowel sounds  Extremities: no acute joint deformity no clubbing. No pitting edema  Neuro: Opens eyes to voice. Moving BUE BLE spontaneously. Does not follow commands. Intermittently moaning/groaning to stimulation  GU: foley   Resolved Hospital Problem list     Assessment & Plan:    Acute encephalopathy, favor metabolic -has been altered since presentation(family says it worsened in the ED),  has worsened over hospital course -Nothing acute on neuro imaging to explain acute changes. Remote stroke.  -COVID + -- query covid encephalopathy. At this point do suspect superimposed delirium related to hospitalization, steroids.  -B12 and folate not optimal but WNL and unlikely to be etiology  -ABG 1/4 WNL  -over course, Rx for delirium limited due to qtc prolongation  -CCM stopped steroids 1/3.  P -neuro has been consulted -EEG pending -monitor for airway protection  -started on valproate 1/3 for delirium -delirium precautions  -cont B vit support  -NPO   Acute respiratory failure with hypoxia due to COVID-19 PNA COVID-19 PNA R>L -s/p remdesivir  - Concern  for superimposed bacterial pneumonia vs aspiration pneumonia P -IS, mobility, supplemental O2 as needed  -Unasyn added 1/3, changed to ceftriaxone + azithromycin -- not sure if aspiration is applicable here (normal PCT, WBC) but with encephalopathy + respiratory decline is not unreasonable for short course  - follow up urine strep and legionella  antigens -Check CRP 1/4, consider actemra   AKI on CKD IIIb Hypernatremia Hyperchloremia -hypovolemic  -received bolus 1L d5 1/3 P - Incr FWF  -trend renal indices   DM with hyperglycemia -steroids, acute illness and initiation of tube feeds -Received D5 bolus 1/3 -steroids stopped 1/3 P - increase to rSSI -q4hr tube feed coverage -BID semglee 12 units   Inadequate PO intake Dysphagia -initially was on dys 2 diet but with progressive encephalopathy, NPO -EN via cortrak   Goals of Care -discussed with pt wife and 2 sons 1/3 (Dr. Erin Fulling, [employer]). Family wishes for Full Code status, and would elect for intubation if indicated. Family adds that pt would not want prolonged support   Best Practice (right click and "Reselect all SmartList Selections" daily)   Diet/type: tubefeeds DVT prophylaxis: prophylactic heparin  GI prophylaxis: N/A Lines: N/A Foley:  N/A Code Status:  full code Last date of multidisciplinary goals of care discussion [1/3 discussed with patient's wife and 2 sons, they would like to proceed with short trial of intubation if needed]  Labs   CBC: Recent Labs  Lab 01/22/21 0131 01/23/21 0106 01/24/21 0117 01/25/21 0117 01/26/21 0249 01/27/21 0100 01/27/21 2343 01/28/21 0251  WBC 4.6 8.1 9.9 12.9* 14.5* 11.2*  --  12.1*  NEUTROABS 3.7 6.9 8.6* 11.7* 12.0*  --   --   --   HGB 15.0 16.6 17.0 16.9 16.3 15.1 13.3 14.5  HCT 44.7 47.8 49.5 49.8 48.6 47.5 39.0 45.1  MCV 92.4 92.6 92.9 93.6 95.5 97.1  --  98.3  PLT 98* 120* 154 148* 145* 120*  --  93*    Basic Metabolic Panel: Recent Labs  Lab 01/23/21 0106 01/24/21 0117 01/25/21 0117 01/26/21 0249 01/27/21 0100 01/27/21 2343 01/28/21 0251  NA 140 142 146* 149* 148* 148* 148*  K 5.5* 4.4 4.2 4.8 4.6 4.3 4.2  CL 114* 113* 117* 116* 116*  --  117*  CO2 16* 18* 19* 22 24  --  24  GLUCOSE 198* 232* 249* 226* 311*  --  231*  BUN 33* 46* 56* 53* 52*  --  62*  CREATININE 1.48* 1.91* 1.93* 1.91*  1.75*  --  2.03*  CALCIUM 8.4* 8.6* 8.7* 8.6* 8.4*  --  8.5*  MG 2.0 2.4 2.3 2.2 2.3  --   --   PHOS 2.6 2.9 2.7 3.5 3.3  --  2.7   GFR: Estimated Creatinine Clearance: 24 mL/min (A) (by C-G formula based on SCr of 2.03 mg/dL (H)). Recent Labs  Lab 01/22/21 0131 01/22/21 0504 01/23/21 0106 01/25/21 0117 01/26/21 0249 01/27/21 0100 01/27/21 1633 01/28/21 0251  PROCALCITON <0.10  --   --   --   --  0.19 0.33 0.93  WBC 4.6  --    < > 12.9* 14.5* 11.2*  --  12.1*  LATICACIDVEN  --  1.8  --   --   --   --   --   --    < > = values in this interval not displayed.    Liver Function Tests: Recent Labs  Lab 01/24/21 0117 01/25/21 0117 01/26/21 0249 01/27/21 0100 01/28/21 0251  AST 65*  60* 42* 33 31  ALT 51* 56* 40 35 32  ALKPHOS 82 82 71 66 59  BILITOT 0.7 0.9 1.6* 1.2 0.9  PROT 6.3* 6.2* 5.7* 5.2* 5.0*  ALBUMIN 3.4* 3.4* 3.2* 2.9* 2.5*   No results for input(s): LIPASE, AMYLASE in the last 168 hours. Recent Labs  Lab 01/22/21 0131 01/28/21 0251  AMMONIA 29 32    ABG    Component Value Date/Time   PHART 7.418 01/27/2021 2343   PCO2ART 36.5 01/27/2021 2343   PO2ART 83 01/27/2021 2343   HCO3 23.3 01/27/2021 2343   TCO2 24 01/27/2021 2343   ACIDBASEDEF 0.8 08/15/2014 0923   O2SAT 96.0 01/27/2021 2343     Coagulation Profile: No results for input(s): INR, PROTIME in the last 168 hours.  Cardiac Enzymes: No results for input(s): CKTOTAL, CKMB, CKMBINDEX, TROPONINI in the last 168 hours.  HbA1C: Hgb A1c MFr Bld  Date/Time Value Ref Range Status  01/22/2021 01:31 AM 8.2 (H) 4.8 - 5.6 % Final    Comment:    (NOTE) Pre diabetes:          5.7%-6.4%  Diabetes:              >6.4%  Glycemic control for   <7.0% adults with diabetes   08/15/2014 09:23 AM 7.5 (H) 4.8 - 5.6 % Final    Comment:    (NOTE)         Pre-diabetes: 5.7 - 6.4         Diabetes: >6.4         Glycemic control for adults with diabetes: <7.0     CBG: Recent Labs  Lab 01/27/21 2105  01/27/21 2328 01/28/21 0351 01/28/21 0809 01/28/21 1140  GLUCAP 269* 307* 216* 328* 393*    CRITICAL CARE Performed by: Cristal Generous   Total critical care time: 38 minutes   Critical care time was exclusive of separately billable procedures and treating other patients. Critical care was necessary to treat or prevent imminent or life-threatening deterioration.  Critical care was time spent personally by me on the following activities: development of treatment plan with patient and/or surrogate as well as nursing, discussions with consultants, evaluation of patient's response to treatment, examination of patient, obtaining history from patient or surrogate, ordering and performing treatments and interventions, ordering and review of laboratory studies, ordering and review of radiographic studies, pulse oximetry and re-evaluation of patient's condition.   Eliseo Gum MSN, AGACNP-BC [place] for pager  01/28/2021, 11:54 AM

## 2021-01-28 NOTE — Progress Notes (Signed)
Neurology Progress Note  Brief HPI: 88 y.o. male with PMHx of AAA, HTN, CAD on clopidogrel and statin with stable angina, aortic stenosis, CKD IIIb, and DM2 who initially presented to the ED 12/28 for SOB, chest pain, epigastric pain, and poor p.o. intake and was subsequently found to be COVID+ with patchy peripheral infiltrates compatible with viral pneumonia on CXR. He was started on Remdisivir and a short course of PO steroids. Patient's presentation was further complicated by an AKI on CKD due to dehydration and poor intake. Hospitalization has been complicated by patient with ongoing agitation and a prolonged Qtc which has limited antipsychotic use for ongoing agitation. Due to ongoing encephalopathy, neurology was consulted for further evaluation.  Subjective: Patient had ongoing tachypnea and agitation overnight and was treated with Zyprexa. Patient was started on VPA for encephalopathy at 250 mg BID on 1/3.  Exam: Vitals:   01/28/21 0816 01/28/21 0900  BP:  (!) 146/57  Pulse:  91  Resp:  (!) 24  Temp: 98.7 F (37.1 C)   SpO2:  93%   Gen: Laying in ICU bed with bilateral soft wrist restraints in place.  Resp: tachypnea on cardiac monitor with SpO2 94%. Nasal cannula in place. Abd: soft, non-distended  Neuro: Mental Status: Patient is drowsy initially but opens his eyes to voice.  Patient has moaning vocalizations but does not attempt to verbally communicate with examiner.  Cranial Nerves: Patient opens eyes briefly to voice but does not fixate or track examiner, on attempt at pupillary evaluation, patient strongly resists passive eye opening, face is symmetric at rest and with grimace, hearing is intact to voice, head appears midline.  Motor: Withdraws each extremity with application of noxious stimuli throughout without noted asymmetry Sensory:As above, grimaces with application of noxious stimuli throughout Gait: Deferred for patient's safety.   Pertinent Labs: CBC     Component Value Date/Time   WBC 12.1 (H) 01/28/2021 0251   RBC 4.59 01/28/2021 0251   HGB 14.5 01/28/2021 0251   HCT 45.1 01/28/2021 0251   PLT 93 (L) 01/28/2021 0251   MCV 98.3 01/28/2021 0251   MCH 31.6 01/28/2021 0251   MCHC 32.2 01/28/2021 0251   RDW 13.7 01/28/2021 0251   LYMPHSABS 1.1 01/26/2021 0249   MONOABS 1.3 (H) 01/26/2021 0249   EOSABS 0.0 01/26/2021 0249   BASOSABS 0.0 01/26/2021 0249   CMP     Component Value Date/Time   NA 148 (H) 01/28/2021 0251   K 4.2 01/28/2021 0251   CL 117 (H) 01/28/2021 0251   CO2 24 01/28/2021 0251   GLUCOSE 231 (H) 01/28/2021 0251   BUN 62 (H) 01/28/2021 0251   CREATININE 2.03 (H) 01/28/2021 0251   CREATININE 1.5 (H) 03/14/2015 1547   CREATININE 1.73 (H) 03/03/2015 1522   CALCIUM 8.5 (L) 01/28/2021 0251   PROT 5.0 (L) 01/28/2021 0251   ALBUMIN 2.5 (L) 01/28/2021 0251   AST 31 01/28/2021 0251   ALT 32 01/28/2021 0251   ALKPHOS 59 01/28/2021 0251   BILITOT 0.9 01/28/2021 0251   GFRNONAA 31 (L) 01/28/2021 0251   GFRAA 38 (L) 07/09/2019 0721   Imaging Reviewed:  CT head 12/28 1. Increasing area of encephalomalacia in the left posterior parietal white matter. MRI correlation is suggested to exclude infarct or mass lesion. No significant mass effect is identified. 2. Chronic atrophy and small vessel ischemic changes  MRI brain 12/29: Truncated study but still beneficial in characterizing the left parietal abnormality as a remote infarct.  Assessment: 89  yo male who was independent at home until his current hospitalization. Over the course of his hospitalization, he has become encephalopathic which we think is multifactorial with metabolic derangements, hospitalization, poor intake, COVID encephalopathy, and PNA. Do feel that delirium is playing a role as he is very sick with prolonged stay in hospital. His examination is significant for prolonged encephalopathy. His prolonged Qtc has made it difficult to use medications that would  not interfere with his delirium. He has been getting Ativan, which may contribute to delirium in the setting of prolonged Qtc and inability to administer antipsychotics.   Recommendations: -Can continue to use and titrate Valproic acid as needed for encephalopathy. He was started at 250mg  BID.  -Continue delirium precautions.  -Continue Vit B12 supplementation. Goal is over 500.  -Continue Plavix and statin due to CAD -Discussed with ICU team at bedside.  -No further neurology recommendations at this time.  , AGACNP-BC [facility] [phone]

## 2021-01-29 ENCOUNTER — Inpatient Hospital Stay (HOSPITAL_COMMUNITY): Payer: Medicare Other

## 2021-01-29 LAB — CBC
HCT: 41.6 % (ref 39.0–52.0)
Hemoglobin: 13.2 g/dL (ref 13.0–17.0)
MCH: 30.8 pg (ref 26.0–34.0)
MCHC: 31.7 g/dL (ref 30.0–36.0)
MCV: 97 fL (ref 80.0–100.0)
Platelets: 86 10*3/uL — ABNORMAL LOW (ref 150–400)
RBC: 4.29 MIL/uL (ref 4.22–5.81)
RDW: 13.5 % (ref 11.5–15.5)
WBC: 7.5 10*3/uL (ref 4.0–10.5)
nRBC: 0 % (ref 0.0–0.2)

## 2021-01-29 LAB — HEPARIN LEVEL (UNFRACTIONATED): Heparin Unfractionated: 0.87 IU/mL — ABNORMAL HIGH (ref 0.30–0.70)

## 2021-01-29 LAB — COMPREHENSIVE METABOLIC PANEL
ALT: 31 U/L (ref 0–44)
AST: 29 U/L (ref 15–41)
Albumin: 2.3 g/dL — ABNORMAL LOW (ref 3.5–5.0)
Alkaline Phosphatase: 66 U/L (ref 38–126)
Anion gap: 7 (ref 5–15)
BUN: 63 mg/dL — ABNORMAL HIGH (ref 8–23)
CO2: 22 mmol/L (ref 22–32)
Calcium: 8.8 mg/dL — ABNORMAL LOW (ref 8.9–10.3)
Chloride: 117 mmol/L — ABNORMAL HIGH (ref 98–111)
Creatinine, Ser: 1.89 mg/dL — ABNORMAL HIGH (ref 0.61–1.24)
GFR, Estimated: 34 mL/min — ABNORMAL LOW (ref >60)
Glucose, Bld: 365 mg/dL — ABNORMAL HIGH (ref 70–99)
Potassium: 4.6 mmol/L (ref 3.5–5.1)
Sodium: 146 mmol/L — ABNORMAL HIGH (ref 135–145)
Total Bilirubin: 0.3 mg/dL (ref 0.3–1.2)
Total Protein: 5.1 g/dL — ABNORMAL LOW (ref 6.5–8.1)

## 2021-01-29 LAB — GLUCOSE, CAPILLARY
Glucose-Capillary: 295 mg/dL — ABNORMAL HIGH (ref 70–99)
Glucose-Capillary: 265 mg/dL — ABNORMAL HIGH (ref 70–99)
Glucose-Capillary: 364 mg/dL — ABNORMAL HIGH (ref 70–99)
Glucose-Capillary: 437 mg/dL — ABNORMAL HIGH (ref 70–99)
Glucose-Capillary: 405 mg/dL — ABNORMAL HIGH (ref 70–99)

## 2021-01-29 LAB — PROCALCITONIN: Procalcitonin: 0.8 ng/mL

## 2021-01-29 LAB — PHOSPHORUS: Phosphorus: 2.8 mg/dL (ref 2.5–4.6)

## 2021-01-29 MED ORDER — FUROSEMIDE 10 MG/ML IJ SOLN: 40.0000 mg | Freq: Once | INTRAMUSCULAR | Status: DC

## 2021-01-29 MED ORDER — HEPARIN (PORCINE) 25000 UT/250ML-% IV SOLN
850.0000 [IU]/h | INTRAVENOUS | Status: DC
  Administered 2021-01-29: 1000 [IU]/h via INTRAVENOUS
  Filled 2021-01-29: qty 250

## 2021-01-29 MED ORDER — INSULIN GLARGINE-YFGN 100 UNIT/ML ~~LOC~~ SOLN
22.0000 [IU] | Freq: Two times a day (BID) | SUBCUTANEOUS | Status: DC
  Administered 2021-01-29: 22 [IU] via SUBCUTANEOUS
  Filled 2021-01-29 (×5): qty 0.22

## 2021-01-29 MED ORDER — INSULIN ASPART 100 UNIT/ML IJ SOLN
10.0000 [IU] | INTRAMUSCULAR | Status: DC
  Administered 2021-01-29 (×2): 10 [IU] via SUBCUTANEOUS

## 2021-01-29 MED ORDER — INSULIN GLARGINE-YFGN 100 UNIT/ML ~~LOC~~ SOLN
15.0000 [IU] | Freq: Two times a day (BID) | SUBCUTANEOUS | Status: DC
  Filled 2021-01-29 (×2): qty 0.15

## 2021-01-29 MED ORDER — INSULIN ASPART 100 UNIT/ML IJ SOLN
15.0000 [IU] | INTRAMUSCULAR | Status: DC
  Administered 2021-01-30: 15 [IU] via SUBCUTANEOUS

## 2021-01-29 MED ORDER — INSULIN GLARGINE-YFGN 100 UNIT/ML ~~LOC~~ SOLN
18.0000 [IU] | Freq: Two times a day (BID) | SUBCUTANEOUS | Status: DC
  Administered 2021-01-29: 18 [IU] via SUBCUTANEOUS
  Filled 2021-01-29 (×2): qty 0.18

## 2021-01-29 MED ORDER — INSULIN ASPART 100 UNIT/ML IJ SOLN
5.0000 [IU] | INTRAMUSCULAR | Status: DC
  Administered 2021-01-29: 5 [IU] via SUBCUTANEOUS

## 2021-01-29 MED ORDER — MIDODRINE HCL 5 MG PO TABS
5.0000 mg | ORAL_TABLET | Freq: Three times a day (TID) | ORAL | Status: DC | PRN
  Filled 2021-01-29: qty 1

## 2021-01-29 MED ORDER — MAGNESIUM SULFATE 2 GM/50ML IV SOLN
2.0000 g | Freq: Once | INTRAVENOUS | Status: AC
  Administered 2021-01-29: 2 g via INTRAVENOUS
  Filled 2021-01-29: qty 50

## 2021-01-29 NOTE — Progress Notes (Signed)
ANTICOAGULATION CONSULT NOTE  Pharmacy Consult for Heparin IV Indication: atrial fibrillation  No Known Allergies  Patient Measurements: Height: 5\' 7"  (170.2 cm) Weight: 77 kg (169 lb 12.1 oz) IBW/kg (Calculated) : 66.1 Heparin Dosing Weight: 77 kg  Vital Signs: Temp: 98 F (36.7 C) (01/05 0717) Temp Source: Axillary (01/05 0717) BP: 159/55 (01/05 0800) Pulse Rate: 70 (01/05 0800)  Labs: Recent Labs    01/27/21 0100 01/27/21 2343 01/28/21 0251 01/29/21 0220  HGB 15.1 13.3 14.5 13.2  HCT 47.5 39.0 45.1 41.6  PLT 120*  --  93* 86*  CREATININE 1.75*  --  2.03* 1.89*    Estimated Creatinine Clearance: 25.7 mL/min (A) (by C-G formula based on SCr of 1.89 mg/dL (H)).   Medical History: Past Medical History:  Diagnosis Date   AAA (abdominal aortic aneurysm)    Anginal pain ([place])    Arthritis    "left shoulder" (07/04/2014)   Constipation    Coronary artery disease    Depression    GERD (gastroesophageal reflux disease)    Headache    History of hiatal hernia    Hyperlipidemia    Hypertension    Inferior myocardial infarction [facility]) 2003   /notes 06/25/2014   Kidney stones X 1   Osteoarthritis    Pneumonia 1935; 1990's X 1   Severe aortic stenosis 05/06/2014   Shortness of breath dyspnea    Type II diabetes mellitus (HCC)     Medications:  Infusions:   cefTRIAXone (ROCEPHIN)  IV Stopped (01/28/21 1712)   feeding supplement (OSMOLITE 1.5 CAL) 1,000 mL (01/28/21 2021)   heparin     magnesium sulfate bolus IVPB      Assessment: 88 yo M presenting with COVID-19 with new onset a fib. Recent transfer to ICU for worsening PNA, oxygenation, and mental status change.   Pharmacy consulted for IV heparin  Last dose of SQH @0600  H/H stable No signs/symptoms of bleed  Goal of Therapy:  Heparin level 0.3-0.7 units/ml Monitor platelets by anticoagulation protocol: Yes   Plan:  Heparin IV 1000 units/hr (10 ml/hr) with no bolus Patient received SQH this am Heparin  level @1800  Daily heparin level and CBC ordered Monitor for signs/symptoms of bleed  Thank you for allowing pharmacy to be a part of this patients care.  Donnald Garre, PharmD Clinical Pharmacist  Please check AMION for all [place] numbers After 10:00 PM, call [place] [phone]

## 2021-01-29 NOTE — Progress Notes (Signed)
MD McQuaid made aware of drop in pt BP and low MAP. Holding Lasix.

## 2021-01-29 NOTE — Progress Notes (Signed)
OT Cancellation Note  Patient Details Name: RAIMUNDO CORBIT MRN: [id number] DOB: [birth year]   Cancelled Treatment:    Reason Eval/Treat Not Completed: Patient not medically ready- spoke to RN, reports agitated this am.  Plans to give haldol soon.  OT will hold at this time.  Will follow and see as appropriate to engage in OT.   Barry Brunner, OT Acute Rehabilitation Services Pager [phone] Office [phone]   Chancy Milroy 01/29/2021, 9:33 AM

## 2021-01-29 NOTE — Progress Notes (Signed)
NAME:  Michael Wolfe, MRN:  [id number], DOB:  08-Sep-1931, LOS: 7 ADMISSION DATE:  2021-01-31, CONSULTATION DATE:  01/27/21 REFERRING MD:  Elgergawy - TRH, CHIEF COMPLAINT:  AMS, tachypnea    History of Present Illness:  88 yo M PMH HTN, AAA, CAD, AS, CKD IIIB, CAD on plavix, DM admitted to [facility] 12/28 after presenting for SOB, chest pain, epigastric pain.  Found to be COVID-19 positive, and CXR revealed patchy infiltrates. Started on remdesivir, steroids. Noted to be delirious fairly early in hospital course -- went for [facility] and MRI brain 12/28 and 12/29 which were negative for acute intracranial process. He has required supplemental O2. BNP PCT are grossly unremarkable.   On 1/3 The patient remains encephalopathic and hypoxic. PCCM is consulted in this setting   Pertinent  Medical History  AAA CAD HTN DM Angina AS CKDIIIb HLD   Significant Hospital Events: Including procedures, antibiotic start and stop dates in addition to other pertinent events   12/28 admitted to [facility], covid +, started on remdesivir and steroids. CT head to r/o intracranial process to explain encephalopathy present on admission 12/29 MRI without acute intracranial abnormality 12/ 30 IVF for AKI. Remains encephalopathic 1/1 remains encephalopathic. qtc is prolonged thus limited Rx options  1/3 progressive hypoxia overnight, starting on [facility] now on HFNC. Remains encephalopathic. Neuro and CCM consulted. Transferred to ICU. Steroids stopped. 1L D5 bolus.  1/4 GOC conversation -- DNR status. Started toci and steroids added back . FWF increased  1/5 increasing insulin (both basal and TF coverage)  Interim History / Subjective:   Looks like rate controlled Afib now.  Weaning O2 -- 12L to 8L HF, SpO2 99  Cr a bit better -- 1.89 today Glu still high 200-300s  Na improving, now 146  Objective   Blood pressure (!) 159/55, pulse 70, temperature 98 F (36.7 C), temperature source Axillary, resp. rate 20, height 5\' 7"   (1.702 m), weight 77 kg, SpO2 100 %.        Intake/Output Summary (Last 24 hours) at 01/29/2021 0939 Last data filed at 01/29/2021 0600 Gross per 24 hour  Intake 5753.54 ml  Output 1925 ml  Net 3828.54 ml   Filed Weights   01/28/21 0500 01/29/21 0451 01/29/21 0717  Weight: 77 kg 77 kg 77 kg    Examination: General: Elderly ill appearing M reclined in bed agitated  HENT: NCAT dry mm. Cortrak. Unable to assess eyes due to pt non-compliance (closes eyes very tightly and is not following commands) Lungs: Coarse breath sounds. Rhonchi. Shallow respirations.  Cardiovascular: irregular rhythm reg rate. S1s2  Abdomen: soft ndnt + bowel sounds  Extremities: No acute joint deformity. No cyanosis. Upper extremity soft restraints  Neuro: Eyes are open spontaneously. Moaning/ groaning. Does not follow any commands Psych: agitated psychomotor movements. Shaking head, groaning and withdrawing from tactile stimulation GU: foley   Resolved Hospital Problem list     Assessment & Plan:    Acute metabolic encephalopathy  -has been altered since presentation(family says it worsened in the ED),  has worsened over hospital course -Nothing acute on neuro imaging to explain acute changes. Remote stroke.  -COVID +. Query covid encephalopathy. At this point, likely superimposed delirium related to hospitalization, steroids.  -B12 and folate not optimal but WNL and unlikely to be etiology  -ABG 1/4 WNL  -over course, Rx for delirium limited due to qtc prolongation  P -delirium precautions  -depakote, PRN haldol   Acute respiratory failure with hypoxia due to  COVID-19 PNA  COVID-19 PNA R>L -s/p remdesivir  - Concern for superimposed bacterial pneumonia vs aspiration pneumonia P -Toci started 1/4 -IS, mobility, supplemental O2 as needed. Weaning O2 1/5 with likely room to continue wean. -short course of cefepime, azithro for possible aspiration  - urine strep and legionella antigens have been  ordered -- follow for result   New onset Afib, rate controlled -acutely, is at an incr risk for clot with covid infection, steroids  P -hep gtt per pharm consult  -2g mag -AM BMP, mag  -cardiac monitoring   AKI on CKD IIIb - improving  Hypernatremia, improving -hypovolemic  -received bolus 1L d5 1/3, Incr FWF 1/4 P -Cont FWF -trend BMP   DM with hyperglycemia  -steroids, acute illness and initiation of tube feeds -Received D5 bolus 1/3, on steroids  P -Incr Semglee to 18 units BID -TF coverage increase to novolog 5 units q4 -rSSI  Inadequate PO intake Dysphagia -initially was on dys 2 diet but with progressive encephalopathy, NPO -EN via cortrak   Goals of care -DNR status after family discussion with Dr. Kendrick Fries 1/4  -overall prognosis guarded  Likely transfer out of ICU  1/5  Best Practice (right click and "Reselect all SmartList Selections" daily)   Diet/type: tubefeeds DVT prophylaxis: systemic heparin GI prophylaxis: N/A Lines: N/A Foley:  Yes, and it is still needed Code Status:  DNR Last date of multidisciplinary goals of care discussion [1/4 -- Discussed code status, DNR]  Labs   CBC: Recent Labs  Lab 01/23/21 0106 01/24/21 0117 01/25/21 0117 01/26/21 0249 01/27/21 0100 01/27/21 2343 01/28/21 0251 01/29/21 0220  WBC 8.1 9.9 12.9* 14.5* 11.2*  --  12.1* 7.5  NEUTROABS 6.9 8.6* 11.7* 12.0*  --   --   --   --   HGB 16.6 17.0 16.9 16.3 15.1 13.3 14.5 13.2  HCT 47.8 49.5 49.8 48.6 47.5 39.0 45.1 41.6  MCV 92.6 92.9 93.6 95.5 97.1  --  98.3 97.0  PLT 120* 154 148* 145* 120*  --  93* 86*    Basic Metabolic Panel: Recent Labs  Lab 01/23/21 0106 01/24/21 0117 01/25/21 0117 01/26/21 0249 01/27/21 0100 01/27/21 2343 01/28/21 0251 01/29/21 0220  NA 140 142 146* 149* 148* 148* 148* 146*  K 5.5* 4.4 4.2 4.8 4.6 4.3 4.2 4.6  CL 114* 113* 117* 116* 116*  --  117* 117*  CO2 16* 18* 19* 22 24  --  24 22  GLUCOSE 198* 232* 249* 226* 311*  --  231*  365*  BUN 33* 46* 56* 53* 52*  --  62* 63*  CREATININE 1.48* 1.91* 1.93* 1.91* 1.75*  --  2.03* 1.89*  CALCIUM 8.4* 8.6* 8.7* 8.6* 8.4*  --  8.5* 8.8*  MG 2.0 2.4 2.3 2.2 2.3  --   --   --   PHOS 2.6 2.9 2.7 3.5 3.3  --  2.7 2.8   GFR: Estimated Creatinine Clearance: 25.7 mL/min (A) (by C-G formula based on SCr of 1.89 mg/dL (H)). Recent Labs  Lab 01/26/21 0249 01/27/21 0100 01/27/21 1633 01/28/21 0251 01/29/21 0220  PROCALCITON  --  0.19 0.33 0.93 0.80  WBC 14.5* 11.2*  --  12.1* 7.5    Liver Function Tests: Recent Labs  Lab 01/25/21 0117 01/26/21 0249 01/27/21 0100 01/28/21 0251 01/29/21 0220  AST 60* 42* 33 31 29  ALT 56* 40 35 32 31  ALKPHOS 82 71 66 59 66  BILITOT 0.9 1.6* 1.2 0.9 0.3  PROT  6.2* 5.7* 5.2* 5.0* 5.1*  ALBUMIN 3.4* 3.2* 2.9* 2.5* 2.3*   No results for input(s): LIPASE, AMYLASE in the last 168 hours. Recent Labs  Lab 01/28/21 0251  AMMONIA 32    ABG    Component Value Date/Time   PHART 7.418 01/27/2021 2343   PCO2ART 36.5 01/27/2021 2343   PO2ART 83 01/27/2021 2343   HCO3 23.3 01/27/2021 2343   TCO2 24 01/27/2021 2343   ACIDBASEDEF 0.8 08/15/2014 0923   O2SAT 96.0 01/27/2021 2343     Coagulation Profile: No results for input(s): INR, PROTIME in the last 168 hours.  Cardiac Enzymes: No results for input(s): CKTOTAL, CKMB, CKMBINDEX, TROPONINI in the last 168 hours.  HbA1C: Hgb A1c MFr Bld  Date/Time Value Ref Range Status  01/22/2021 01:31 AM 8.2 (H) 4.8 - 5.6 % Final    Comment:    (NOTE) Pre diabetes:          5.7%-6.4%  Diabetes:              >6.4%  Glycemic control for   <7.0% adults with diabetes   08/15/2014 09:23 AM 7.5 (H) 4.8 - 5.6 % Final    Comment:    (NOTE)         Pre-diabetes: 5.7 - 6.4         Diabetes: >6.4         Glycemic control for adults with diabetes: <7.0     CBG: Recent Labs  Lab 01/28/21 1528 01/28/21 1911 01/28/21 2315 01/29/21 0311 01/29/21 0710  GLUCAP 315* 330* 351* 295* 265*     CCT: n/a  Tessie FassGrace Jatavis Malek MSN, AGACNP-BC [facility] Amion for pager  01/29/2021, 9:39 AM

## 2021-01-29 NOTE — Progress Notes (Signed)
Nutrition Follow-up  DOCUMENTATION CODES:   Severe malnutrition in context of acute illness/injury  INTERVENTION:   Continue tube feeds via Cortrak: - Osmolite 1.5 @ 60 ml/hr (1440 ml/day) - ProSource TF 45 ml BID - Free water flushes per MD, currently 400 ml q 4 hours  Tube feeding regimen provides 2240 kcal, 112 grams of protein, and 1097 ml of H2O.   Total free water with flushes: 3497 ml  NUTRITION DIAGNOSIS:   Severe Malnutrition related to acute illness (COVID+) as evidenced by severe fat depletion, severe muscle depletion.  Ongoing, being addressed via TF  GOAL:   Patient will meet greater than or equal to 90% of their needs  Met via TF  MONITOR:   Labs, Weight trends, TF tolerance  REASON FOR ASSESSMENT:   Consult Enteral/tube feeding initiation and management  ASSESSMENT:   Pt admitted with SOB, chest pain/epigastric pain secondary to COVID+. PMH includes AAA, HTN, CAD, aortic stenosis, CKD II, HLD, and T2DM  01/02 - Cortrak placed (tip gastric) 01/03 - transferred to ICU  Discussed pt with RN and during ICU rounds. No diet order in place at this time. Pt tolerating tube feeds via Cortrak at goal rate. Pt remains agitated and confused.  Admit weight: 85.8 kg Current weight: 77 kg  Current TF: Osmolite 1.5 @ 60 ml/hr, ProSource TF 45 ml BID, free water 400 ml q 4 hours  Medications reviewed and include: colace, SSI q 4 hours, novolog 6 units q 4 hours, semglee 18 units BID, IV solu-medrol, vitamin B-12 250 mcg daily, IV abx, heparin drip, IV magnesium sulfate 2 grams once  Labs reviewed: sodium 146, BUN 63, creatinine 1.89 CBG's: 265-393 x 24 hours  UOP: 1925 ml x 24 hours I/O's: +2.7 L since admit  Diet Order:   Diet Order     None       EDUCATION NEEDS:   Not appropriate for education at this time  Skin:  Skin Assessment: Reviewed RN Assessment  Last BM:  01/29/21 large type 7  Height:   Ht Readings from Last 1 Encounters:   01/29/21 5' 7"  (1.702 m)    Weight:   Wt Readings from Last 1 Encounters:  01/29/21 77 kg    BMI:  Body mass index is 26.59 kg/m.  Estimated Nutritional Needs:   Kcal:  2100-2300  Protein:  105-115  Fluid:  >/=2.1L    Gustavus Bryant, MS, RD, LDN Inpatient Clinical Dietitian Please see AMiON for contact information.

## 2021-01-29 NOTE — Progress Notes (Signed)
Pt desat to low 80's with a good wave and respirations in the upper 30's and into 40's. adjusted O2 and respiratory was called. Non- rebreather placed and pt came back up to 90's and sustained. Respiratory placed pt on heated high flow and respirations have come down and o2 in 90's. MD messaged. (D. McQuaid)

## 2021-01-29 NOTE — Progress Notes (Signed)
eLink Physician-Brief Progress Note Patient Name: Michael Wolfe DOB: [birth year] MRN: [id number]   Date of Service  01/29/2021  HPI/Events of Note  Hyperglycemia - Blood glucose = 405. Patient is currently on SemGlee Insulin Q 12 hour + Q 4 hour resistant Novolog SSI + Novolog 10 units Q 4 hours extra coverage.  eICU Interventions  Plan: Will increase Q 4 hour extra Novolog coverage to 15 units.      Intervention Category Major Interventions: Hyperglycemia - active titration of insulin therapy  Lenell Antu 01/29/2021, 8:58 PM

## 2021-01-29 NOTE — Progress Notes (Signed)
Patient transported to 5 N. Wife and son notified and walked to unit. No signs of distress noted.

## 2021-01-29 NOTE — Progress Notes (Signed)
eLink Physician-Brief Progress Note Patient Name: Michael Wolfe DOB: 04-27-1931 MRN: [id number]   Date of Service  01/29/2021  HPI/Events of Note  Nursing reports 3 large dark stools since 7 PM. Nursing request for Flexiseal.   eICU Interventions  Plan: Stool for occult blood now. H/H STAT.  If stool is negative for occult blood, will place Flexiseal.      Intervention Category Major Interventions: Other:  Lenell Antu 01/29/2021, 11:31 PM

## 2021-01-29 NOTE — Progress Notes (Signed)
ANTICOAGULATION CONSULT NOTE  Pharmacy Consult for Heparin IV Indication: atrial fibrillation  No Known Allergies  Patient Measurements: Height: 5\' 7"  (170.2 cm) Weight: 77 kg (169 lb 12.1 oz) IBW/kg (Calculated) : 66.1 Heparin Dosing Weight: 77 kg  Vital Signs: Temp: 97.8 F (36.6 C) (01/05 2011) Temp Source: Oral (01/05 2011) BP: 97/48 (01/05 2011) Pulse Rate: 101 (01/05 2011)  Labs: Recent Labs    01/27/21 0100 01/27/21 2343 01/28/21 0251 01/29/21 0220 01/29/21 1824  HGB 15.1 13.3 14.5 13.2  --   HCT 47.5 39.0 45.1 41.6  --   PLT 120*  --  93* 86*  --   HEPARINUNFRC  --   --   --   --  0.87*  CREATININE 1.75*  --  2.03* 1.89*  --      Estimated Creatinine Clearance: 25.7 mL/min (A) (by C-G formula based on SCr of 1.89 mg/dL (H)).   Medical History: Past Medical History:  Diagnosis Date   AAA (abdominal aortic aneurysm)    Anginal pain ([place])    Arthritis    "left shoulder" (07/04/2014)   Constipation    Coronary artery disease    Depression    GERD (gastroesophageal reflux disease)    Headache    History of hiatal hernia    Hyperlipidemia    Hypertension    Inferior myocardial infarction [facility]) 2003   /notes 06/25/2014   Kidney stones X 1   Osteoarthritis    Pneumonia 1935; 1990's X 1   Severe aortic stenosis 05/06/2014   Shortness of breath dyspnea    Type II diabetes mellitus (HCC)     Medications:  Infusions:   cefTRIAXone (ROCEPHIN)  IV 2 g (01/29/21 1844)   feeding supplement (OSMOLITE 1.5 CAL) 1,000 mL (01/28/21 2021)   heparin 1,000 Units/hr (01/29/21 1600)    Assessment: 88 yo M presenting with COVID-19 with new onset a fib. Recent transfer to ICU for worsening PNA, oxygenation, and mental status change.   Pharmacy consulted for IV heparin. Patient received SQH this am ~ 0600.  H/H stable this morning.  Pltc low at [facility].   The initial 8 hour heparin level is 0.87, supra-therapeutic on heparin drip 1000 units/hr. No  bleeding reported per  RN.    Goal of Therapy:  Heparin level 0.3-0.7 units/ml Monitor platelets by anticoagulation protocol: Yes   Plan:  Decrease Heparin IV to 850 units/hr (10 ml/hr). Check 8 hour heparin level @ 0500.  Daily heparin level and CBC ordered Monitor for signs/symptoms of bleed  Thank you for allowing pharmacy to be a part of this patients care. Nicole Cella, RPh Clinical Pharmacist 01/29/2021 8:21 PM  Please check AMION for all [place] numbers After 10:00 PM, call [place] [phone]

## 2021-01-29 NOTE — Progress Notes (Signed)
RT NOTES: Placed patient on HHFNC 70%/40L to assist with work of breathing. Sats 98% and WOB is decreasing. Will continue to monitor.

## 2021-01-29 NOTE — Progress Notes (Signed)
Inpatient Diabetes Program Recommendations  AACE/ADA: New Consensus Statement on Inpatient Glycemic Control (2015)  Target Ranges:  Prepandial:   less than 140 mg/dL      Peak postprandial:   less than 180 mg/dL (1-2 hours)      Critically ill patients:  140 - 180 mg/dL   Lab Results  Component Value Date   GLUCAP 265 (H) 01/29/2021   HGBA1C 8.2 (H) 01/22/2021    Review of Glycemic Control  Latest Reference Range & Units 01/28/21 15:28 01/28/21 19:11 01/28/21 23:15 01/29/21 03:11 01/29/21 07:10  Glucose-Capillary 70 - 99 mg/dL 433 (H) 295 (H) 188 (H) 295 (H) 265 (H)  (H): Data is abnormally high Diabetes history: Type 2 DM Outpatient Diabetes medications: Amaryl 4 mg qd, Metformin 500 mg TID Current orders for Inpatient glycemic control: Semglee 18 units BID, Novolog 5 units Q4H, Novolog 0-20 units Q4H Decadron- Solumedrol 20 mg QD  Inpatient Diabetes Program Recommendations:    Noted insulin adjustments. In the setting of steroids, consider further increasing tube feed coverage to Novolog 8 units Q4H (to be stopped or held in the event tube feeds are stopped).  Thanks, Lujean Rave, MSN, RNC-OB Diabetes Coordinator [phone] (8a-5p)

## 2021-01-29 NOTE — Progress Notes (Signed)
[facility] PCCM  Hypotensive Hold lasix for now F/u CXR   Heber Dayton, MD [place] PCCM Pager: [phone] Cell: [phone] After 7:00 pm call Elink  [phone]

## 2021-01-29 NOTE — Progress Notes (Signed)
[facility] PCCM  Hypoxemia after transfer CXR Lasix 40mg  IV once  , MD [place] PCCM Pager: [phone] Cell: [phone] After 7:00 pm call Elink  [phone]

## 2021-01-29 NOTE — Plan of Care (Signed)
  Problem: Safety: Goal: Non-violent Restraint(s) Outcome: Not Progressing   Problem: Education: Goal: Knowledge of General Education information will improve Description: Including pain rating scale, medication(s)/side effects and non-pharmacologic comfort measures Outcome: Not Progressing   Problem: Health Behavior/Discharge Planning: Goal: Ability to manage health-related needs will improve Outcome: Not Progressing   Problem: Clinical Measurements: Goal: Ability to maintain clinical measurements within normal limits will improve Outcome: Not Progressing Goal: Will remain free from infection Outcome: Not Progressing Goal: Diagnostic test results will improve Outcome: Not Progressing Goal: Respiratory complications will improve Outcome: Not Progressing Goal: Cardiovascular complication will be avoided Outcome: Not Progressing   Problem: Activity: Goal: Risk for activity intolerance will decrease Outcome: Not Progressing   Problem: Nutrition: Goal: Adequate nutrition will be maintained Outcome: Not Progressing   Problem: Coping: Goal: Level of anxiety will decrease Outcome: Not Progressing   Problem: Elimination: Goal: Will not experience complications related to bowel motility Outcome: Not Progressing Goal: Will not experience complications related to urinary retention Outcome: Not Progressing   Problem: Pain Managment: Goal: General experience of comfort will improve Outcome: Not Progressing   Problem: Safety: Goal: Ability to remain free from injury will improve Outcome: Not Progressing   Problem: Skin Integrity: Goal: Risk for impaired skin integrity will decrease Outcome: Not Progressing   

## 2021-01-29 NOTE — Progress Notes (Signed)
ANTICOAGULATION CONSULT NOTE  Pharmacy Consult for Heparin IV Indication: atrial fibrillation  No Known Allergies  Patient Measurements: Height: 5\' 7"  (170.2 cm) Weight: 77 kg (169 lb 12.1 oz) IBW/kg (Calculated) : 66.1 Heparin Dosing Weight: 77 kg  Vital Signs: Temp: 97.8 F (36.6 C) (01/05 2011) Temp Source: Oral (01/05 2011) BP: 97/48 (01/05 2011) Pulse Rate: 101 (01/05 2011)  Labs: Recent Labs    01/27/21 0100 01/27/21 2343 01/28/21 0251 01/29/21 0220 01/29/21 1824  HGB 15.1 13.3 14.5 13.2  --   HCT 47.5 39.0 45.1 41.6  --   PLT 120*  --  93* 86*  --   HEPARINUNFRC  --   --   --   --  0.87*  CREATININE 1.75*  --  2.03* 1.89*  --      Estimated Creatinine Clearance: 25.7 mL/min (A) (by C-G formula based on SCr of 1.89 mg/dL (H)).   Medical History: Past Medical History:  Diagnosis Date   AAA (abdominal aortic aneurysm)    Anginal pain ([place])    Arthritis    "left shoulder" (07/04/2014)   Constipation    Coronary artery disease    Depression    GERD (gastroesophageal reflux disease)    Headache    History of hiatal hernia    Hyperlipidemia    Hypertension    Inferior myocardial infarction [facility]) 2003   /notes 06/25/2014   Kidney stones X 1   Osteoarthritis    Pneumonia 1935; 1990's X 1   Severe aortic stenosis 05/06/2014   Shortness of breath dyspnea    Type II diabetes mellitus (HCC)     Medications:  Infusions:   cefTRIAXone (ROCEPHIN)  IV 2 g (01/29/21 1844)   feeding supplement (OSMOLITE 1.5 CAL) 1,000 mL (01/28/21 2021)   heparin 1,000 Units/hr (01/29/21 1600)    Assessment: 88 yo M presenting with COVID-19 with new onset a fib. Recent transfer to ICU for worsening PNA, oxygenation, and mental status change.   Pharmacy consulted for IV heparin. Patient received SQH this am ~ 0600.  H/H stable this morning.  Pltc low at [facility].   The initial 8 hour heparin level is 0.87, supra-therapeutic on heparin drip 1000 units/hr. No  bleeding reported per  RN.    Goal of Therapy:  Heparin level 0.3-0.7 units/ml Monitor platelets by anticoagulation protocol: Yes   Plan:  Decrease Heparin IV to 850 units/hr (10 ml/hr). Check 6-8 hour heparin level @ 0400.  Daily heparin level and CBC ordered Monitor for signs/symptoms of bleed  Thank you for allowing pharmacy to be a part of this patients care. Nicole Cella, RPh Clinical Pharmacist 01/29/2021 8:30 PM  Please check AMION for all [place] numbers After 10:00 PM, call [place] [phone]

## 2021-01-30 LAB — GLUCOSE, CAPILLARY: Glucose-Capillary: 414 mg/dL — ABNORMAL HIGH (ref 70–99)

## 2021-01-30 LAB — OCCULT BLOOD X 1 CARD TO LAB, STOOL: Fecal Occult Bld: POSITIVE — AB

## 2021-02-25 NOTE — Progress Notes (Signed)
-  Late Entry- 01/29/2021 This nurse notified floor charge nurse Amrah, RN that pt was a [place], pt BP was in low 90's systolic and 50's diastolic. Pt respirations were in the upper 30's and pt sugar was 437.  This nurse felt this pt was not appropriate and voiced concerns about safety.

## 2021-02-25 NOTE — Progress Notes (Addendum)
Patient not doing too well since taking over report, RN in constant communication with the provider. Patient had three large black stool within the shift and MD  was notified. Shortly after cleaning patient, writer noted patient's respiration rate in the 50's. Was still on the phone with ELI NK when patient's starting desaturating. Respiratory therapists and rapid response called. By the time the RT and rapid response RN came, patient was reading zero on oxygen saturation and asystole on the monitor.  Patient was confirmed dead at 12:30 by the Clinical research associate and witness by the charge nurse Alona Bene. MD notified.

## 2021-02-25 NOTE — Accreditation Note (Signed)
Restraints not reported to CMS Pursuant to regulation 482.13 (G) (3) use of soft wrist restraints was logged on 02/02/2021.

## 2021-02-25 NOTE — Death Summary Note (Signed)
DEATH SUMMARY   Patient Details  Name: Michael Wolfe MRN: [id number] DOB: 1931-07-28  Admission/Discharge Information   Admit Date:  02/13/21  Date of Death: Date of Death: 02-22-21  Time of Death: Time of Death: Jun 17, 2022  Length of Stay: 8  Referring Physician: Rinaldo Cloud, MD   Reason(s) for Hospitalization  D yspnea  Diagnoses  Preliminary cause of death:  COVID-19 pneumonia Secondary Diagnoses (including complications and co-morbidities):  Principal Problem:   COVID Active Problems:   COVID-19 virus infection   Encephalopathy due to COVID-19 virus   Protein-calorie malnutrition, severe   Brief Hospital Course (including significant findings, care, treatment, and services provided and events leading to death)  Michael Wolfe is a 88 y.o. year old male who is completely unvaccinated against COVID 19 and was admitted in the setting of confusion, epigastric pain, chest pain and dyspnea.  His family said he was unvaccinated because he was afraid of needles.  He was admitted, had head imaging due to his change in mental status (MRI and CT brain) which were non-diagnostic.  He was treated with decadron and remdesivir.  PCCM was consulted when he developed worsening oxygenation and confusion so he was moved to the ICU because the family desired full code.  On arrival to the ICU he was only able to moan, writhe around in the bed.  He had been given haldol on the floor but because of QTc prolongation this was held.  The patient's CXR showed worsening infiltrates and his oxygen needs increased from 2L [place] to high flow nasal cannula.  After discussing his overall poor prognosis with multi-organ failure (encephalopathy and respiratory failure) with his family his code status was made DNR. We elected to give him tocilizumab as his condition was deteriorating from COVID-19 pneumonia.  He briefly enjoyed an improvement in his oxygenation but later in the evening his oxygenation worsened as did  his blood pressure. He passed away in the evening of 02/21/2021 from consequences of his COVID-19 pneumonia.     Pertinent Labs and Studies  Significant Diagnostic Studies DG Chest 2 View  Result Date: 2021/02/13 CLINICAL DATA:  Shortness of breath and chest pain EXAM: CHEST - 2 VIEW COMPARISON:  06/28/2014 FINDINGS: Stable cardiomediastinal contours. Aortic atherosclerotic calcifications. Lung volumes are low. No pleural effusion or edema. No airspace consolidation. Remote right anterior fourth rib fracture deformity. IMPRESSION: 1. No acute findings. 2. Low lung volumes. Electronically Signed   By: Signa Kell M.D.   On: 2021-02-13 14:21   CT HEAD WO CONTRAST ( )  Result Date: 02-13-2021 CLINICAL DATA:  Delirium EXAM: CT HEAD WITHOUT CONTRAST TECHNIQUE: Contiguous axial images were obtained from the base of the skull through the vertex without intravenous contrast. COMPARISON:  07/09/2019 FINDINGS: Brain: Diffuse cerebral atrophy. Ventricular dilatation consistent with central atrophy. Low-attenuation changes in the deep white matter consistent with small vessel ischemia. There is focal encephalomalacia in the left posterior parietal white matter extending to the subcortical region. This is progressing since the previous study. Differential diagnosis would include infarct or mass lesion. Consider MRI for further evaluation. No abnormal extra-axial fluid collections. No mass effect or midline shift. Gray-white matter junctions are distinct. Basal cisterns are not effaced. No acute intracranial hemorrhage. Vascular: Moderate intracranial arterial vascular calcifications. Skull: Calvarium appears intact. Sinuses/Orbits: Mucosal thickening in the paranasal sinuses. No acute air-fluid levels. Mastoid air cells are clear. Other: None. IMPRESSION: 1. Increasing area of encephalomalacia in the left posterior parietal white matter. MRI correlation  is suggested to exclude infarct or mass lesion. No  significant mass effect is identified. 2. Chronic atrophy and small vessel ischemic changes. Electronically Signed   By: Lucienne Capers M.D.   On: 12/27/2020 20:24   CT CERVICAL SPINE WO CONTRAST  Result Date: 01/22/2021 CLINICAL DATA:  88 year old male with history of neck pain after a fall. EXAM: CT CERVICAL SPINE WITHOUT CONTRAST TECHNIQUE: Multidetector CT imaging of the cervical spine was performed without intravenous contrast. Multiplanar CT image reconstructions were also generated. COMPARISON:  No priors. FINDINGS: Alignment: Normal. Skull base and vertebrae: No acute fracture. No primary bone lesion or focal pathologic process. Soft tissues and spinal canal: No prevertebral fluid or swelling. No visible canal hematoma. Disc levels: Multilevel degenerative disc disease, most pronounced at C5-C6 and C6-C7. Mild multilevel facet arthropathy. Upper chest: See report for contemporaneously obtained CT the chest, abdomen and pelvis dated 01/16/2021 for full description of findings in the upper chest. Other: Old healed fracture of the medial aspect of the left clavicle. IMPRESSION: 1. No evidence of significant acute traumatic injury to the cervical spine. 2. Multilevel degenerative disc disease and cervical spondylosis, as above. Electronically Signed   By: Vinnie Langton M.D.   On: 01/22/2021 06:25   MR BRAIN WO CONTRAST  Result Date: 01/22/2021 CLINICAL DATA:  Neuro deficit with acute stroke suspected. Possible stroke or malignancy by head CT EXAM: MRI HEAD WITHOUT CONTRAST TECHNIQUE: Multiplanar, multiecho pulse sequences of the brain and surrounding structures were obtained without intravenous contrast. COMPARISON:  Head CT from yesterday FINDINGS: Truncated study due to respiratory condition. No acute infarct, acute hemorrhage, hydrocephalus, or swelling. The left parietal abnormality has a cortical appearance consistent with encephalomalacia from old infarction. There is a notable degree of  adjacent white matter T2 hyperintensity, but no associated swelling. With the benefit of this study, a July 09, 2019 head CT shows acute cortical infarction in the same area. IMPRESSION: Truncated study but still beneficial in characterizing the left parietal abnormality as a remote infarct. Electronically Signed   By: Jorje Guild M.D.   On: 01/22/2021 07:55   DG CHEST PORT 1 VIEW  Result Date: 01/29/2021 CLINICAL DATA:  Acute respiratory failure with hypoxemia. EXAM: PORTABLE CHEST 1 VIEW COMPARISON:  Chest x-ray 01/29/2021. FINDINGS: Enteric tube extends below the diaphragm. Patient is status post TAVR. The heart is mildly enlarged, unchanged. Mediastinal silhouette is stable. There is a stable small right pleural effusion. There is no new focal lung consolidation or pneumothorax. No acute fractures are seen. IMPRESSION: 1. Stable small right pleural effusion. No new focal lung infiltrate. Electronically Signed   By: Ronney Asters M.D.   On: 01/29/2021 19:24   DG CHEST PORT 1 VIEW  Result Date: 01/29/2021 CLINICAL DATA:  Respiratory failure due to COVID. EXAM: PORTABLE CHEST 1 VIEW COMPARISON:  January 21, 2021, January 27, 2021 FINDINGS: The cardiomediastinal silhouette is unchanged in contour.Status post aortic valve replacement. Atherosclerotic calcifications of the aorta. Enteric tube tip projects over the distal stomach/proximal duodenum. Likely trace bilateral pleural effusions. No pneumothorax. Peripheral predominant opacities, particularly in the RIGHT lung, similar comparison to most recent prior but increased since December 28th. Visualized abdomen is unremarkable. Multilevel degenerative changes of the thoracic spine. IMPRESSION: Similar appearance of RIGHT greater than LEFT peripheral predominant opacities most consistent with the sequela of COVID-19 infection. Electronically Signed   By: Valentino Saxon M.D.   On: 01/29/2021 07:48   DG CHEST PORT 1 VIEW  Result Date:  01/27/2021 CLINICAL  DATA:  Hypoxia, COVID pneumonia EXAM: PORTABLE CHEST 1 VIEW COMPARISON:  01/10/2021 FINDINGS: Nasoenteric feeding tube extends in the upper abdomen beyond the margin of the examination. Lung volumes are small and pulmonary insufflation has diminished since prior examination. Multifocal pulmonary infiltrate has developed throughout the right lung, more severe at the right lung base, likely infectious or inflammatory in nature. No pneumothorax or pleural effusion. Cardiac size within normal limits. 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 No acute bone abnormality. IMPRESSION: Interval development of multifocal pulmonary infiltrate throughout the right lung, likely infectious or inflammatory. Progressive pulmonary hypoinflation. Electronically Signed   By: Fidela Salisbury M.D.   On: 01/27/2021 03:11   DG Abd Portable 1V  Result Date: 01/26/2021 CLINICAL DATA:  Assess feeding tube placement. EXAM: PORTABLE ABDOMEN - 1 VIEW COMPARISON:  12/31/2020 FINDINGS: Feeding tube is identified with tip projecting over the right upper quadrant of the abdomen in the expected location of the antropyloric junction. Bowel gas pattern appears nonobstructed with gas noted in the colon. Aortic stent graft is noted. IMPRESSION: Feeding tube tip projects over the antropyloric junction. Electronically Signed   By: Kerby Moors M.D.   On: 01/26/2021 11:43   EEG adult  Result Date: 01/28/2021 Lora Havens, MD     01/28/2021 12:53 PM Patient Name: KU LEISNER MRN: [id number] Epilepsy Attending: Lora Havens Referring Physician/Provider: Freddi Starr, MD Date: 01/28/2021 Duration: 23.22 mins Patient history: 88 year old male with altered mental status.  EEG to evaluate for seizure. Level of alertness: Awake AEDs during EEG study: None Technical aspects: This EEG study was done with scalp electrodes positioned according to the 10-20 International system of electrode placement. Electrical  activity was acquired at a sampling rate of 500Hz  and reviewed with a high frequency filter of 70Hz  and a low frequency filter of 1Hz . EEG data were recorded continuously and digitally stored. Description:EEG showed continuous generalized 3 to 6 Hz theta-delta slowing. Hyperventilation and photic stimulation were not performed.   Note, EEG was technically difficult due to significant movement and electrode artifact. ABNORMALITY - Continuous slow, generalized IMPRESSION: This technically difficult study is suggestive of moderate diffuse encephalopathy, nonspecific etiology. No seizures or epileptiform discharges were Lora Havens   ECHOCARDIOGRAM COMPLETE  Result Date: 01/22/2021    ECHOCARDIOGRAM REPORT   Patient Name:   JOEZIAH SALEM Date of Exam: 01/22/2021 Medical Rec #:  [id number]       Height:       67.0 in Accession #:    [id number]      Weight:       189.2 lb Date of Birth:  04-20-1931      BSA:          1.975 m Patient Age:    70 years        BP:           112/71 mmHg Patient Gender: M               HR:           79 bpm. Exam Location:  Inpatient Procedure: 2D Echo, Cardiac Doppler, Color Doppler and Intracardiac            Opacification Agent Indications:    Aortic stenosis I35.0  History:        Patient has prior history of Echocardiogram examinations, most                 recent 07/04/2015. Signs/Symptoms:Shortness of Breath; Risk  Factors:Hypertension, Diabetes and Dyslipidemia. COVID. AAA.                 Chronic kidney disease. GERD.                 Aortic Valve: 26 mm Sapien prosthetic, stented (TAVR) valve is                 present in the aortic position. Procedure Date: 07/23/2014.  Sonographer:    Darlina Sicilian RDCS Referring Phys: [id number] [place]  1. Left ventricular ejection fraction, by estimation, is 60 to 65%. Left ventricular ejection fraction by PLAX is 61 %. The left ventricle has normal function. The left ventricle has no regional wall motion  abnormalities. There is mild concentric left ventricular hypertrophy. Indeterminate diastolic filling due to E-A fusion.  2. Right ventricular systolic function is normal. The right ventricular size is normal.  3. The mitral valve is normal in structure. No evidence of mitral valve regurgitation. No evidence of mitral stenosis.  4. Prosthetic aortic valve mean gradient unchanged since 2017. The aortic valve has been repaired/replaced. Aortic valve regurgitation is not visualized. There is a 26 mm Sapien prosthetic (TAVR) valve present in the aortic position. Procedure Date: 07/23/2014. Echo findings are consistent with normal structure and function of the aortic valve prosthesis. Aortic valve area, by VTI measures 1.22 cm. Aortic valve mean gradient measures 17.0 mmHg. Aortic valve Vmax measures 2.71 m/s.  5. The inferior vena cava is normal in size with greater than 50% respiratory variability, suggesting right atrial pressure of 3 mmHg. FINDINGS  Left Ventricle: Left ventricular ejection fraction, by estimation, is 60 to 65%. Left ventricular ejection fraction by PLAX is 61 %. The left ventricle has normal function. The left ventricle has no regional wall motion abnormalities. Definity contrast agent was given IV to delineate the left ventricular endocardial borders. The left ventricular internal cavity size was normal in size. There is mild concentric left ventricular hypertrophy. Indeterminate diastolic filling due to E-A fusion. Right Ventricle: The right ventricular size is normal. No increase in right ventricular wall thickness. Right ventricular systolic function is normal. Left Atrium: Left atrial size was normal in size. Right Atrium: Right atrial size was normal in size. Pericardium: There is no evidence of pericardial effusion. Mitral Valve: The mitral valve is normal in structure. No evidence of mitral valve regurgitation. No evidence of mitral valve stenosis. Tricuspid Valve: The tricuspid valve is  normal in structure. Tricuspid valve regurgitation is trivial. No evidence of tricuspid stenosis. Aortic Valve: Prosthetic aortic valve mean gradient unchanged since 2017. The aortic valve has been repaired/replaced. Aortic valve regurgitation is not visualized. Aortic valve mean gradient measures 17.0 mmHg. Aortic valve peak gradient measures 29.4 mmHg. Aortic valve area, by VTI measures 1.22 cm. There is a 26 mm Sapien prosthetic, stented (TAVR) valve present in the aortic position. Procedure Date: 07/23/2014. Echo findings are consistent with normal structure and function of the aortic valve prosthesis. Pulmonic Valve: The pulmonic valve was normal in structure. Pulmonic valve regurgitation is trivial. No evidence of pulmonic stenosis. Aorta: The aortic root is normal in size and structure. Venous: The inferior vena cava is normal in size with greater than 50% respiratory variability, suggesting right atrial pressure of 3 mmHg. IAS/Shunts: No atrial level shunt detected by color flow Doppler.  LEFT VENTRICLE PLAX 2D LV EF:         Left ventricular ejection fraction by PLAX is 61 %. LVIDd:  4.60 cm LVIDs:         3.10 cm LV PW:         1.20 cm LV IVS:        1.12 cm LVOT diam:     2.10 cm LV SV:         54 LV SV Index:   28 LVOT Area:     3.46 cm  LEFT ATRIUM             Index LA Vol (A2C):   42.9 ml 21.72 ml/m LA Vol (A4C):   55.8 ml 28.25 ml/m LA Biplane Vol: 49.6 ml 25.12 ml/m  AORTIC VALVE AV Area (Vmax):    1.42 cm AV Area (Vmean):   1.36 cm AV Area (VTI):     1.22 cm AV Vmax:           271.00 cm/s AV Vmean:          173.000 cm/s AV VTI:            0.446 m AV Peak Grad:      29.4 mmHg AV Mean Grad:      17.0 mmHg LVOT Vmax:         111.00 cm/s LVOT Vmean:        68.100 cm/s LVOT VTI:          0.157 m LVOT/AV VTI ratio: 0.35  AORTA Ao Asc diam: 3.00 cm TRICUSPID VALVE TR Peak grad:   9.0 mmHg TR Vmax:        150.00 cm/s  SHUNTS Systemic VTI:  0.16 m Systemic Diam: 2.10 cm Skeet Latch MD  Electronically signed by Skeet Latch MD Signature Date/Time: 01/22/2021/1:45:14 PM    Final    CT CHEST ABDOMEN PELVIS WO CONTRAST  Result Date: 12/28/2020 CLINICAL DATA:  Chronic cough, shortness of breath EXAM: CT CHEST, ABDOMEN AND PELVIS WITHOUT CONTRAST TECHNIQUE: Multidetector CT imaging of the chest, abdomen and pelvis was performed following the standard protocol without IV contrast. COMPARISON:  07/09/2019 FINDINGS: CT CHEST FINDINGS Cardiovascular: Extensive coronary artery calcifications are seen. There is prosthetic stent/valve in the aortic valve. There is mild ectasia of proximal descending thoracic aorta. Mediastinum/Nodes: No significant lymphadenopathy seen. Lungs/Pleura: There are small patchy ground-glass infiltrates in the periphery of both lungs, more so on the right side. There is no significant pleural effusion or pneumothorax. Musculoskeletal: No significant abnormality is seen. CT ABDOMEN PELVIS FINDINGS Hepatobiliary: There are multiple calcified gallbladder stones. Pancreas: No focal abnormality is seen. Spleen: Unremarkable. Adrenals/Urinary Tract: Adrenals are unremarkable. There is no hydronephrosis. There are few small bilateral renal stones each measuring less than 2 mm in size. There are possible small parapelvic cysts in both kidneys. Ureters are not dilated. Urinary bladder is unremarkable. Stomach/Bowel: Stomach is unremarkable. Small bowel loops are not dilated. Appendix is not dilated. There is no significant wall thickening in colon. Scattered diverticula are seen in colon without signs of focal diverticulitis. Vascular/Lymphatic: There is previous endovascular stent repair of aortic aneurysm. Native aneurysm measures 4.9 x 5 cm with no significant change since 07/09/2019. There is no evidence of retroperitoneal hematoma. Reproductive: Unremarkable. Other: There is no ascites or pneumoperitoneum. Bilateral inguinal hernias containing fat are seen. Musculoskeletal:  Decrease in height of bodies of L2 and L4 vertebrae has not changed. IMPRESSION: There are small scattered ground-glass infiltrates in the periphery of both lungs, more so on the right side. Differential diagnostic possibilities would include interstitial pneumonitis or scarring. There is no focal pulmonary consolidation.  There is no significant pleural effusion or pneumothorax. Extensive coronary artery calcifications are seen. Prosthetic valve/stent is seen in the aortic root. There is no evidence of intestinal obstruction or pneumoperitoneum. There is no hydronephrosis. Appendix is not dilated. There is previous endovascular stent repair of infrarenal aortic aneurysm. Overall size of the aneurysm has not changed significantly. There is no retroperitoneal hematoma. Gallbladder stones.  Diverticulosis of colon.  Renal stones. Other findings as described in the body of the report. Electronically Signed   By: Elmer Picker M.D.   On: 01/02/2021 18:24    Microbiology Recent Results (from the past 240 hour(s))  Resp Panel by RT-PCR (Flu A&B, Covid) Nasopharyngeal Swab     Status: Abnormal   Collection Time: 01/10/2021  4:50 PM   Specimen: Nasopharyngeal Swab; Nasopharyngeal(NP) swabs in vial transport medium  Result Value Ref Range Status   SARS Coronavirus 2 by RT PCR POSITIVE (A) NEGATIVE Final    Comment: (NOTE) SARS-CoV-2 target nucleic acids are DETECTED.  The SARS-CoV-2 RNA is generally detectable in upper respiratory specimens during the acute phase of infection. Positive results are indicative of the presence of the identified virus, but do not rule out bacterial infection or co-infection with other pathogens not detected by the test. Clinical correlation with patient history and other diagnostic information is necessary to determine patient infection status. The expected result is Negative.  Fact Sheet for Patients: EntrepreneurPulse.com.au  Fact Sheet for Healthcare  Providers: IncredibleEmployment.be  This test is not yet approved or cleared by the [place] FDA and  has been authorized for detection and/or diagnosis of SARS-CoV-2 by FDA under an Emergency Use Authorization (EUA).  This EUA will remain in effect (meaning this test can be used) for the duration of  the COVID-19 declaration under Section 564(b)(1) of the A ct, 21 U.S.C. section 360bbb-3(b)(1), unless the authorization is terminated or revoked sooner.     Influenza A by PCR NEGATIVE NEGATIVE Final   Influenza B by PCR NEGATIVE NEGATIVE Final    Comment: (NOTE) The Xpert Xpress SARS-CoV-2/FLU/RSV plus assay is intended as an aid in the diagnosis of influenza from Nasopharyngeal swab specimens and should not be used as a sole basis for treatment. Nasal washings and aspirates are unacceptable for Xpert Xpress SARS-CoV-2/FLU/RSV testing.  Fact Sheet for Patients: EntrepreneurPulse.com.au  Fact Sheet for Healthcare Providers: IncredibleEmployment.be  This test is not yet approved or cleared by the [place] FDA and has been authorized for detection and/or diagnosis of SARS-CoV-2 by FDA under an Emergency Use Authorization (EUA). This EUA will remain in effect (meaning this test can be used) for the duration of the COVID-19 declaration under Section 564(b)(1) of the Act, 21 U.S.C. section 360bbb-3(b)(1), unless the authorization is terminated or revoked.  Performed at [facility], [place] [address], [place], [place] [address]   Culture, blood (routine x 2)     Status: None   Collection Time: 01/22/21  5:04 AM   Specimen: BLOOD RIGHT HAND  Result Value Ref Range Status   Specimen Description BLOOD RIGHT HAND  Final   Special Requests   Final    BOTTLES DRAWN AEROBIC AND ANAEROBIC Blood Culture adequate volume   Culture   Final    NO GROWTH 5 DAYS Performed at [facility], [place] [address]  [place], [place] [address]    Report Status 01/27/2021 FINAL  Final  Culture, blood (routine x 2)     Status: None   Collection Time: 01/22/21  5:04 AM   Specimen: BLOOD RIGHT ARM  Result Value Ref Range Status   Specimen Description BLOOD RIGHT ARM  Final   Special Requests   Final    BOTTLES DRAWN AEROBIC AND ANAEROBIC Blood Culture adequate volume   Culture   Final    NO GROWTH 5 DAYS Performed at [facility], [address], [place], [place] [address]    Report Status 01/27/2021 FINAL  Final  Urine Culture     Status: Abnormal   Collection Time: 01/22/21  5:21 AM   Specimen: Urine, Clean Catch  Result Value Ref Range Status   Specimen Description URINE, CLEAN CATCH  Final   Special Requests   Final    NONE Performed at [facility], [place] [address] [place], [place] [address]    Culture MULTIPLE SPECIES PRESENT, SUGGEST RECOLLECTION (A)  Final   Report Status 01/23/2021 FINAL  Final  MRSA Next Gen by PCR, Nasal     Status: None   Collection Time: 01/27/21  8:54 PM   Specimen: Nasal Mucosa; Nasal Swab  Result Value Ref Range Status   MRSA by PCR Next Gen NOT DETECTED NOT DETECTED Final    Comment: (NOTE) The GeneXpert MRSA Assay (FDA approved for NASAL specimens only), is one component of a comprehensive MRSA colonization surveillance program. It is not intended to diagnose MRSA infection nor to guide or monitor treatment for MRSA infections. Test performance is not FDA approved in patients less than 2 years old. Performed at [facility], [place] [address] [place], [place] [address]     Lab Basic Metabolic Panel: Recent Labs  Lab 01/24/21 0117 01/25/21 0117 01/26/21 0249 01/27/21 0100 01/27/21 2343 01/28/21 0251 01/29/21 0220  NA 142 146* 149* 148* 148* 148* 146*  K 4.4 4.2 4.8 4.6 4.3 4.2 4.6  CL 113* 117* 116* 116*  --  117* 117*  CO2 18* 19* 22 24  --  24 22  GLUCOSE 232* 249* 226* 311*  --  231* 365*  BUN 46* 56* 53* 52*  --  62* 63*   CREATININE 1.91* 1.93* 1.91* 1.75*  --  2.03* 1.89*  CALCIUM 8.6* 8.7* 8.6* 8.4*  --  8.5* 8.8*  MG 2.4 2.3 2.2 2.3  --   --   --   PHOS 2.9 2.7 3.5 3.3  --  2.7 2.8   Liver Function Tests: Recent Labs  Lab 01/25/21 0117 01/26/21 0249 01/27/21 0100 01/28/21 0251 01/29/21 0220  AST 60* 42* 33 31 29  ALT 56* 40 35 32 31  ALKPHOS 82 71 66 59 66  BILITOT 0.9 1.6* 1.2 0.9 0.3  PROT 6.2* 5.7* 5.2* 5.0* 5.1*  ALBUMIN 3.4* 3.2* 2.9* 2.5* 2.3*   No results for input(s): LIPASE, AMYLASE in the last 168 hours. Recent Labs  Lab 01/28/21 0251  AMMONIA 32   CBC: Recent Labs  Lab 01/24/21 0117 01/25/21 0117 01/26/21 0249 01/27/21 0100 01/27/21 2343 01/28/21 0251 01/29/21 0220  WBC 9.9 12.9* 14.5* 11.2*  --  12.1* 7.5  NEUTROABS 8.6* 11.7* 12.0*  --   --   --   --   HGB 17.0 16.9 16.3 15.1 13.3 14.5 13.2  HCT 49.5 49.8 48.6 47.5 39.0 45.1 41.6  MCV 92.9 93.6 95.5 97.1  --  98.3 97.0  PLT 154 148* 145* 120*  --  93* 86*   Cardiac Enzymes: No results for input(s): CKTOTAL, CKMB, CKMBINDEX, TROPONINI in the last 168 hours. Sepsis  Labs: Recent Labs  Lab 01/26/21 0249 01/27/21 0100 01/27/21 1633 01/28/21 0251 01/29/21 0220  PROCALCITON  --  0.19 0.33 0.93 0.80  WBC 14.5* 11.2*  --  12.1* 7.5    Procedures/Operations  none   Roselie Awkward 02-28-2021, 5:45 PM

## 2021-02-25 DEATH — deceased
